# Patient Record
Sex: Female | Born: 1990 | Race: Black or African American | Hispanic: No | Smoking: Never smoker
Health system: Southern US, Community
[De-identification: ages and names within clinical notes are randomized; demographics above are authoritative.]

## PROBLEM LIST (undated history)

## (undated) ENCOUNTER — Inpatient Hospital Stay (HOSPITAL_COMMUNITY): Payer: Self-pay

## (undated) DIAGNOSIS — U071 COVID-19: Secondary | ICD-10-CM

## (undated) DIAGNOSIS — K5900 Constipation, unspecified: Secondary | ICD-10-CM

## (undated) DIAGNOSIS — D649 Anemia, unspecified: Secondary | ICD-10-CM

## (undated) DIAGNOSIS — R079 Chest pain, unspecified: Secondary | ICD-10-CM

## (undated) DIAGNOSIS — F32A Depression, unspecified: Secondary | ICD-10-CM

## (undated) DIAGNOSIS — F419 Anxiety disorder, unspecified: Secondary | ICD-10-CM

## (undated) DIAGNOSIS — R0602 Shortness of breath: Secondary | ICD-10-CM

## (undated) DIAGNOSIS — R7303 Prediabetes: Secondary | ICD-10-CM

## (undated) DIAGNOSIS — B379 Candidiasis, unspecified: Secondary | ICD-10-CM

## (undated) DIAGNOSIS — F3181 Bipolar II disorder: Secondary | ICD-10-CM

## (undated) DIAGNOSIS — D219 Benign neoplasm of connective and other soft tissue, unspecified: Secondary | ICD-10-CM

## (undated) DIAGNOSIS — F909 Attention-deficit hyperactivity disorder, unspecified type: Secondary | ICD-10-CM

## (undated) DIAGNOSIS — Z62810 Personal history of physical and sexual abuse in childhood: Secondary | ICD-10-CM

## (undated) DIAGNOSIS — F329 Major depressive disorder, single episode, unspecified: Secondary | ICD-10-CM

## (undated) DIAGNOSIS — J189 Pneumonia, unspecified organism: Secondary | ICD-10-CM

## (undated) DIAGNOSIS — F4310 Post-traumatic stress disorder, unspecified: Secondary | ICD-10-CM

## (undated) DIAGNOSIS — F603 Borderline personality disorder: Secondary | ICD-10-CM

## (undated) DIAGNOSIS — M7989 Other specified soft tissue disorders: Secondary | ICD-10-CM

## (undated) DIAGNOSIS — M549 Dorsalgia, unspecified: Secondary | ICD-10-CM

## (undated) DIAGNOSIS — F319 Bipolar disorder, unspecified: Secondary | ICD-10-CM

## (undated) DIAGNOSIS — M2550 Pain in unspecified joint: Secondary | ICD-10-CM

## (undated) DIAGNOSIS — G4730 Sleep apnea, unspecified: Secondary | ICD-10-CM

## (undated) HISTORY — DX: Other specified soft tissue disorders: M79.89

## (undated) HISTORY — DX: Anxiety disorder, unspecified: F41.9

## (undated) HISTORY — DX: Constipation, unspecified: K59.00

## (undated) HISTORY — DX: Pain in unspecified joint: M25.50

## (undated) HISTORY — DX: Post-traumatic stress disorder, unspecified: F43.10

## (undated) HISTORY — DX: Dorsalgia, unspecified: M54.9

## (undated) HISTORY — DX: Chest pain, unspecified: R07.9

## (undated) HISTORY — DX: Prediabetes: R73.03

## (undated) HISTORY — DX: Shortness of breath: R06.02

## (undated) HISTORY — DX: Sleep apnea, unspecified: G47.30

## (undated) HISTORY — DX: Attention-deficit hyperactivity disorder, unspecified type: F90.9

---

## 2011-02-11 ENCOUNTER — Emergency Department (HOSPITAL_COMMUNITY)
Admission: EM | Admit: 2011-02-11 | Discharge: 2011-02-12 | Disposition: A | Discharge status: Home / Self Care | Payer: 59 | Attending: Emergency Medicine | Admitting: Emergency Medicine

## 2011-02-11 ENCOUNTER — Encounter: Payer: Self-pay | Admitting: Emergency Medicine

## 2011-02-11 DIAGNOSIS — R109 Unspecified abdominal pain: Secondary | ICD-10-CM | POA: Insufficient documentation

## 2011-02-11 DIAGNOSIS — R509 Fever, unspecified: Secondary | ICD-10-CM | POA: Insufficient documentation

## 2011-02-11 DIAGNOSIS — R112 Nausea with vomiting, unspecified: Secondary | ICD-10-CM | POA: Insufficient documentation

## 2011-02-11 DIAGNOSIS — R197 Diarrhea, unspecified: Secondary | ICD-10-CM

## 2011-02-11 MED ORDER — ONDANSETRON HCL 4 MG/2ML IJ SOLN
4.0000 mg | Freq: Once | INTRAMUSCULAR | Status: DC
  Filled 2011-02-11: qty 2

## 2011-02-11 MED ORDER — ACETAMINOPHEN 325 MG PO TABS
650.0000 mg | ORAL_TABLET | Freq: Once | ORAL | Status: AC
  Administered 2011-02-12: 650 mg via ORAL
  Filled 2011-02-11: qty 2

## 2011-02-11 NOTE — ED Notes (Signed)
Generalized abdominal pain with nausea and vomiting onset 4 a.m. today

## 2011-02-11 NOTE — ED Notes (Signed)
WUJ:WJ19<JY> Expected date:02/11/11<BR> Expected time:10:18 PM<BR> Means of arrival:Ambulance<BR> Comments:<BR> M251. 21 yo f. abd pain since 2000. Stable, ambulatory. TRIAGE.  10 MIN ETA

## 2011-02-12 LAB — URINALYSIS, ROUTINE W REFLEX MICROSCOPIC
Bilirubin Urine: NEGATIVE
Hgb urine dipstick: NEGATIVE
Ketones, ur: NEGATIVE mg/dL
Specific Gravity, Urine: 1.023 (ref 1.005–1.030)
Urobilinogen, UA: 0.2 mg/dL (ref 0.0–1.0)

## 2011-02-12 LAB — DIFFERENTIAL
Eosinophils Absolute: 0 10*3/uL (ref 0.0–0.7)
Eosinophils Relative: 0 % (ref 0–5)
Lymphs Abs: 0.5 10*3/uL — ABNORMAL LOW (ref 0.7–4.0)
Monocytes Relative: 3 % (ref 3–12)

## 2011-02-12 LAB — LIPASE, BLOOD: Lipase: 16 U/L (ref 11–59)

## 2011-02-12 LAB — POCT I-STAT, CHEM 8
Calcium, Ion: 1.13 mmol/L (ref 1.12–1.32)
Glucose, Bld: 111 mg/dL — ABNORMAL HIGH (ref 70–99)
HCT: 37 % (ref 36.0–46.0)
Hemoglobin: 12.6 g/dL (ref 12.0–15.0)
TCO2: 23 mmol/L (ref 0–100)

## 2011-02-12 LAB — CBC
MCH: 25.8 pg — ABNORMAL LOW (ref 26.0–34.0)
MCHC: 32.8 g/dL (ref 30.0–36.0)
MCV: 78.9 fL (ref 78.0–100.0)
Platelets: 253 10*3/uL (ref 150–400)
RBC: 4.45 MIL/uL (ref 3.87–5.11)

## 2011-02-12 LAB — HEPATIC FUNCTION PANEL
Alkaline Phosphatase: 73 U/L (ref 39–117)
Total Bilirubin: 0.5 mg/dL (ref 0.3–1.2)

## 2011-02-12 MED ORDER — KETOROLAC TROMETHAMINE 30 MG/ML IJ SOLN
30.0000 mg | Freq: Once | INTRAMUSCULAR | Status: AC
  Administered 2011-02-12: 30 mg via INTRAVENOUS
  Filled 2011-02-12: qty 1

## 2011-02-12 MED ORDER — OXYCODONE-ACETAMINOPHEN 5-325 MG PO TABS
1.0000 | ORAL_TABLET | Freq: Once | ORAL | Status: AC
  Administered 2011-02-12: 1 via ORAL
  Filled 2011-02-12: qty 1

## 2011-02-12 MED ORDER — ONDANSETRON HCL 4 MG PO TABS: 4.0000 mg | ORAL_TABLET | Freq: Four times a day (QID) | ORAL | Status: AC

## 2011-02-12 NOTE — ED Notes (Signed)
Oral temp 99.2--Reports feeling much improved---Has drank some Ginger-ale and tolerated well w/o nausea or vomiting

## 2011-02-12 NOTE — ED Provider Notes (Signed)
History     CSN: [id number]  Arrival date & time 02/11/11  2232   First MD Initiated Contact with Patient 02/11/11 2334      Chief Complaint  Patient presents with  . Abdominal Pain    (Consider location/radiation/quality/duration/timing/severity/associated sxs/prior treatment) Patient is a 21 y.o. female presenting with abdominal pain. The history is provided by the patient. No language interpreter was used.  Abdominal Pain The primary symptoms of the illness include abdominal pain, fever, nausea, vomiting and diarrhea. The primary symptoms of the illness do not include dysuria or vaginal discharge. Primary symptoms comment: urinary frequency The current episode started 13 to 24 hours ago. The onset of the illness was gradual. The problem has not changed since onset. The illness is associated with awakening from sleep. Symptoms associated with the illness do not include chills, anorexia, diaphoresis, heartburn, constipation, urgency, hematuria, frequency or back pain. Significant associated medical issues do not include inflammatory bowel disease or diabetes.    No past medical history on file.  No past surgical history on file.  No family history on file.  History  Substance Use Topics  . Smoking status: Not on file  . Smokeless tobacco: Not on file  . Alcohol Use: Not on file    OB History    Grav Para Term Preterm Abortions TAB SAB Ect Mult Living                  Review of Systems  Constitutional: Positive for fever. Negative for chills and diaphoresis.  HENT: Negative for neck pain and neck stiffness.   Eyes: Negative.   Respiratory: Negative for cough.   Cardiovascular: Negative for chest pain.  Gastrointestinal: Positive for nausea, vomiting, abdominal pain and diarrhea. Negative for heartburn, constipation, abdominal distention and anorexia.  Genitourinary: Negative for dysuria, urgency, frequency, hematuria and vaginal discharge.  Musculoskeletal: Negative for  back pain.  Skin: Negative.   Neurological: Negative.   Hematological: Negative.   Psychiatric/Behavioral: Negative.     Allergies  Augmentin  Home Medications  No current outpatient prescriptions on file.  BP 131/50  Pulse 104  Temp(Src) 100.9 F (38.3 C) (Oral)  Resp 18  SpO2 99%  Physical Exam  Vitals reviewed. Constitutional: She is oriented to person, place, and time. She appears well-developed and well-nourished. No distress.  HENT:  Head: Normocephalic and atraumatic.  Mouth/Throat: Oropharynx is clear and moist. No oropharyngeal exudate.  Eyes: Conjunctivae and EOM are normal. Pupils are equal, round, and reactive to light.  Neck: Normal range of motion. Neck supple.  Cardiovascular: Normal rate and regular rhythm.   Pulmonary/Chest: Effort normal and breath sounds normal.  Abdominal: Soft. Bowel sounds are normal. There is no tenderness. There is no rebound and no guarding.  Genitourinary: Cervix exhibits no motion tenderness. Right adnexum displays no tenderness. Left adnexum displays no tenderness.       Chaperone present  Musculoskeletal: Normal range of motion. She exhibits no edema.  Lymphadenopathy:    She has no cervical adenopathy.  Neurological: She is alert and oriented to person, place, and time.  Skin: Skin is warm and dry.  Psychiatric: Thought content normal.    ED Course  Procedures (including critical care time)  Labs Reviewed  CBC - Abnormal; Notable for the following:    Hemoglobin 11.5 (*)    HCT 35.1 (*)    MCH 25.8 (*)    All other components within normal limits  DIFFERENTIAL - Abnormal; Notable for the following:  Neutrophils Relative 91 (*)    Neutro Abs 8.8 (*)    Lymphocytes Relative 5 (*)    Lymphs Abs 0.5 (*)    All other components within normal limits  POCT PREGNANCY, URINE  URINALYSIS, ROUTINE W REFLEX MICROSCOPIC  POCT PREGNANCY, URINE  I-STAT, CHEM 8  HEPATIC FUNCTION PANEL  LIPASE, BLOOD   No results  found.   No diagnosis found.    MDM  Po challenged with no further nausea lower abdominal cramping resolved.  No RLQ pain, abdomen is soft and non tender and benign.  No indication for imaging as exam and labs are reasurring.  Patient told to follow up with doctor for pap smear and to return for persistent vomiting diarrhea, fevers chills worsening pain especially that localizes to the right lower quadrant or any concerns.  Patient verbalizes understanding and agrees to follow up        Darin Redmann Smitty Cords, MD 02/12/11 1610

## 2011-03-28 ENCOUNTER — Encounter (HOSPITAL_COMMUNITY): Payer: Self-pay | Admitting: Emergency Medicine

## 2011-03-28 ENCOUNTER — Emergency Department (HOSPITAL_COMMUNITY)
Admission: EM | Admit: 2011-03-28 | Discharge: 2011-03-28 | Disposition: A | Discharge status: Home / Self Care | Payer: 59 | Attending: Emergency Medicine | Admitting: Emergency Medicine

## 2011-03-28 DIAGNOSIS — Z008 Encounter for other general examination: Secondary | ICD-10-CM | POA: Insufficient documentation

## 2011-03-28 DIAGNOSIS — F329 Major depressive disorder, single episode, unspecified: Secondary | ICD-10-CM

## 2011-03-28 DIAGNOSIS — G4700 Insomnia, unspecified: Secondary | ICD-10-CM | POA: Insufficient documentation

## 2011-03-28 DIAGNOSIS — F3189 Other bipolar disorder: Secondary | ICD-10-CM | POA: Insufficient documentation

## 2011-03-28 HISTORY — DX: Bipolar II disorder: F31.81

## 2011-03-28 LAB — RAPID URINE DRUG SCREEN, HOSP PERFORMED
Opiates: NOT DETECTED
Tetrahydrocannabinol: NOT DETECTED

## 2011-03-28 LAB — URINALYSIS, ROUTINE W REFLEX MICROSCOPIC
Glucose, UA: NEGATIVE mg/dL
Leukocytes, UA: NEGATIVE
Nitrite: NEGATIVE
Protein, ur: NEGATIVE mg/dL
Urobilinogen, UA: 0.2 mg/dL (ref 0.0–1.0)

## 2011-03-28 LAB — CBC
HCT: 35 % — ABNORMAL LOW (ref 36.0–46.0)
Hemoglobin: 11.2 g/dL — ABNORMAL LOW (ref 12.0–15.0)
MCV: 80.8 fL (ref 78.0–100.0)
Platelets: 293 10*3/uL (ref 150–400)
RBC: 4.33 MIL/uL (ref 3.87–5.11)
WBC: 7.5 10*3/uL (ref 4.0–10.5)

## 2011-03-28 LAB — COMPREHENSIVE METABOLIC PANEL
AST: 16 U/L (ref 0–37)
BUN: 13 mg/dL (ref 6–23)
CO2: 24 mEq/L (ref 19–32)
Chloride: 104 mEq/L (ref 96–112)
Creatinine, Ser: 0.84 mg/dL (ref 0.50–1.10)
GFR calc non Af Amer: >90 mL/min (ref >90)
Total Bilirubin: 0.1 mg/dL — ABNORMAL LOW (ref 0.3–1.2)

## 2011-03-28 MED ORDER — IBUPROFEN 600 MG PO TABS: 600.0000 mg | ORAL_TABLET | Freq: Three times a day (TID) | ORAL | Status: DC | PRN

## 2011-03-28 MED ORDER — ALUM & MAG HYDROXIDE-SIMETH 200-200-20 MG/5ML PO SUSP: 30.0000 mL | ORAL | Status: DC | PRN

## 2011-03-28 MED ORDER — ACETAMINOPHEN 325 MG PO TABS: 650.0000 mg | ORAL_TABLET | ORAL | Status: DC | PRN

## 2011-03-28 MED ORDER — ONDANSETRON HCL 4 MG PO TABS: 4.0000 mg | ORAL_TABLET | Freq: Three times a day (TID) | ORAL | Status: DC | PRN

## 2011-03-28 NOTE — ED Notes (Signed)
Crisis Counselor leaves, pt remains in triage room, denies SI, cont to monitor

## 2011-03-28 NOTE — ED Notes (Signed)
Pt with hx of Bipolar D/O/Depression, off medications x 2 yrs, sees therapist on regular basis, reports she is having some difficulty coping with her best friend/roommate telling her after they had multiple sexual encounters that he no longer wanted to be her friend. Pt denies SI/HI/AH/VH, states what roommate told her was abrupt and she needs some time to get away and figure out how she is going to cope with having to see him everyday and not hand out with him like she used to.

## 2011-03-28 NOTE — ED Notes (Signed)
Pt ambulates to restroom to provide urine sample.

## 2011-03-28 NOTE — ED Notes (Signed)
Pt alert, c/o SI, ? Attempt, took advil, nyquil, states wants to sleep, wrote suicide notes last week, resp even unlabored, skin pwd, pt presents with counselor from [facility]

## 2011-03-28 NOTE — ED Provider Notes (Signed)
20yo F, c/o feeling "depressed."  No SI.  Hx of same, not taking her meds, has an outpt mental health provider.  Seen by [facility].  Pt continues to deny SI.  They have spoken with pt's outpt MH provider who will f/u pt.  Will d/c.   Laray Anger, DO 03/28/11 1214

## 2011-03-28 NOTE — ED Notes (Signed)
Report to Natasha RN

## 2011-03-28 NOTE — ED Notes (Signed)
Counsellor interviewing patient

## 2011-03-28 NOTE — ED Provider Notes (Signed)
History     CSN: [id number]  Arrival date & time 03/28/11  7829   First MD Initiated Contact with Patient 03/28/11 [phone]      Chief Complaint  Patient presents with  . Medical Clearance  . Depression    Patient is a 21 y.o. female presenting with mental health disorder. The history is provided by the patient. The history is limited by a language barrier.  Mental Health Problem The primary symptoms do not include dysphoric mood, delusions, hallucinations, bizarre behavior, disorganized speech, negative symptoms or somatic symptoms. The current episode started more than 2 weeks ago. This is a recurrent problem.  The onset of the illness is precipitated by a stressful event and emotional stress. The degree of incapacity that she is experiencing as a consequence of her illness is moderate. Sequelae of the illness include an inability to work and harmed interpersonal relations. Additional symptoms of the illness include insomnia, appetite change and fatigue. Additional symptoms of the illness do not include no increased goal-directed activity, no flight of ideas, no decreased need for sleep, no headaches, no abdominal pain or no seizures. She does not admit to suicidal ideas. She does not have a plan to commit suicide. She does not contemplate harming herself. She has not already injured self. She does not contemplate injuring another person. She has not already  injured another person. Risk factors that are present for mental illness include a history of mental illness and a family history of mental illness.   patient reports increasing difficulties with depression since approximately August 2012. States symptoms were precipitated by the breakup of her relationship.patient admits that she has been diagnosed with bipolar type II disorder as a young child, but the last 2 years has been able to manage her symptoms without medications. 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 States symptoms include inability to get a class relief her apartment, stays in the bed all the time and has very little appetite. She has been staying with a roommate who told her this morning that he was tired of dealing with her issues and essentially ask her to find somewhere else to stay. Patient states she feels herself getting out of control of her symptoms and feels that she needs more intense therapy than her outpatient visits with her therapist. Patient denies SI/HI. Denies any physical complaints or recent illnesses.    Past Medical History  Diagnosis Date  . Bipolar 2 disorder     History reviewed. No pertinent past surgical history.  No family history on file.  History  Substance Use Topics  . Smoking status: Never Smoker   . Smokeless tobacco: Not on file  . Alcohol Use: No    OB History    Grav Para Term Preterm Abortions TAB SAB Ect Mult Living                  Review of Systems  Constitutional: Positive for appetite change and fatigue.  HENT: Negative.   Eyes: Negative.   Respiratory: Negative.   Cardiovascular: Negative.   Gastrointestinal: Negative.  Negative for abdominal pain.  Genitourinary: Negative.   Musculoskeletal: Negative.   Skin: Negative.   Neurological: Negative.  Negative for seizures and headaches.  Hematological: Negative.   Psychiatric/Behavioral: Negative for hallucinations  and dysphoric mood. The patient has insomnia.     Allergies  Augmentin  Home Medications  No current outpatient prescriptions on file.  BP 130/67  Pulse 70  Temp(Src) 98.5 F (36.9 C) (Oral)  Resp 18  Wt 199 lb 12.8 oz (90.629 kg)  SpO2 100%  LMP 03/18/2011  Physical Exam  Constitutional: She is oriented to person, place, and time. She appears well-developed and  well-nourished.  HENT:  Head: Normocephalic and atraumatic.  Eyes: Conjunctivae are normal.  Neck: Neck supple.  Cardiovascular: Normal rate and regular rhythm.   Pulmonary/Chest: Effort normal and breath sounds normal.  Abdominal: Soft. Bowel sounds are normal.  Musculoskeletal: Normal range of motion.  Neurological: She is alert and oriented to person, place, and time.  Skin: Skin is warm and dry. No erythema.  Psychiatric: She has a normal mood and affect.    ED Course  Procedures  [phone]:  Will move patient to psych ED holding, for medical clearance and place request for Telepsych consult. I have discussed patient with Dr. Hyacinth Meeker who is aware of plan and is agreeable.   Labs Reviewed  CBC - Abnormal; Notable for the following:    Hemoglobin 11.2 (*)    HCT 35.0 (*)    MCH 25.9 (*)    All other components within normal limits  COMPREHENSIVE METABOLIC PANEL - Abnormal; Notable for the following:    Glucose, Bld 108 (*)    Total Bilirubin 0.1 (*)    All other components within normal limits  ETHANOL  PREGNANCY, URINE  URINALYSIS, ROUTINE W REFLEX MICROSCOPIC  URINE RAPID DRUG SCREEN (HOSP PERFORMED)   No results found.   No diagnosis found.    MDM  HPI/PE c/w 1. Normal exam 2. Medical clearance and Telepysch eval pending        Leanne Chang, NP 03/28/11 [phone]

## 2011-03-28 NOTE — ED Notes (Signed)
MD at bedside. 

## 2011-03-28 NOTE — ED Notes (Signed)
Warmed breakfast up, given cereal

## 2011-03-28 NOTE — ED Notes (Signed)
Pt states not Suicidal @ this time, dealing with a lot of stress, wants to come here voluntarily

## 2011-03-30 NOTE — ED Provider Notes (Signed)
Medical screening examination/treatment/procedure(s) were performed by non-physician practitioner and as supervising physician I was immediately available for consultation/collaboration.   Vida Roller, MD 03/30/11 [phone]

## 2011-06-16 ENCOUNTER — Emergency Department (HOSPITAL_COMMUNITY)
Admission: EM | Admit: 2011-06-16 | Discharge: 2011-06-16 | Discharge status: Against Medical Advice | Payer: 59 | Attending: Emergency Medicine | Admitting: Emergency Medicine

## 2011-06-16 ENCOUNTER — Inpatient Hospital Stay (HOSPITAL_COMMUNITY)
Admission: AD | Admit: 2011-06-16 | Discharge: 2011-06-17 | Disposition: A | Discharge status: Home / Self Care | Payer: 59 | Source: Ambulatory Visit | Attending: Obstetrics and Gynecology | Admitting: Obstetrics and Gynecology

## 2011-06-16 ENCOUNTER — Encounter (HOSPITAL_COMMUNITY): Payer: Self-pay | Admitting: Emergency Medicine

## 2011-06-16 ENCOUNTER — Encounter (HOSPITAL_COMMUNITY): Payer: Self-pay | Admitting: *Deleted

## 2011-06-16 DIAGNOSIS — N949 Unspecified condition associated with female genital organs and menstrual cycle: Secondary | ICD-10-CM | POA: Insufficient documentation

## 2011-06-16 DIAGNOSIS — B3731 Acute candidiasis of vulva and vagina: Secondary | ICD-10-CM | POA: Insufficient documentation

## 2011-06-16 DIAGNOSIS — N390 Urinary tract infection, site not specified: Secondary | ICD-10-CM | POA: Insufficient documentation

## 2011-06-16 DIAGNOSIS — N898 Other specified noninflammatory disorders of vagina: Secondary | ICD-10-CM | POA: Insufficient documentation

## 2011-06-16 DIAGNOSIS — B373 Candidiasis of vulva and vagina: Secondary | ICD-10-CM | POA: Insufficient documentation

## 2011-06-16 DIAGNOSIS — B379 Candidiasis, unspecified: Secondary | ICD-10-CM

## 2011-06-16 DIAGNOSIS — B49 Unspecified mycosis: Secondary | ICD-10-CM

## 2011-06-16 HISTORY — DX: Bipolar disorder, unspecified: F31.9

## 2011-06-16 HISTORY — DX: Pneumonia, unspecified organism: J18.9

## 2011-06-16 LAB — POCT PREGNANCY, URINE: Preg Test, Ur: NEGATIVE

## 2011-06-16 NOTE — ED Notes (Signed)
Pt c/o vaginal itching, white discharge, denies odor. Denies dysuria.

## 2011-06-16 NOTE — MAU Note (Signed)
Pt states, " I have missed my period as I'm on a 28 day cycle and I'm never late. I started having vaginal itching and burning two- three days ago and now I can barely walk."

## 2011-06-16 NOTE — MAU Provider Note (Signed)
History     CSN: [id number]  Arrival date and time: 06/16/11 2225   First Provider Initiated Contact with Patient 06/16/11 2353      Chief Complaint  Patient presents with  . Vaginal Pain   HPI  Pt is here with report of vaginal itching and irritation x 2 days.  +white thick vaginal discharge, no vaginal odor.   +vaginal lesions x 2 days.  Denies recently shaving vagina.  Started after intercourse when not well lubricated.  Patient's last menstrual period was 05/15/2011.  +condoms intermittently.      Past Medical History  Diagnosis Date  . Bipolar 2 disorder   . Pneumonia     age 5   . Bipolar disorder     History reviewed. No pertinent past surgical history.  Family History  Problem Relation Age of Onset  . Anesthesia problems Neg Hx   . Hypotension Neg Hx   . Malignant hyperthermia Neg Hx   . Pseudochol deficiency Neg Hx     History  Substance Use Topics  . Smoking status: Never Smoker   . Smokeless tobacco: Not on file  . Alcohol Use: 0.6 oz/week    1 Glasses of wine per week     occasional    Allergies:  Allergies  Allergen Reactions  . Amoxicillin-Pot Clavulanate     No prescriptions prior to admission    Review of Systems  Genitourinary:       Vaginal lesions and discharge  All other systems reviewed and are negative.   Physical Exam   Blood pressure 121/55, pulse 77, temperature 99.5 F (37.5 C), temperature source Oral, resp. rate 20, height 5' 2.5" (1.588 m), weight 84.936 kg (187 lb 4 oz), last menstrual period 05/15/2011.  Physical Exam  Constitutional: She is oriented to person, place, and time. She appears well-developed and well-nourished. No distress.  HENT:  Head: Normocephalic.  Neck: Normal range of motion. Neck supple.  Cardiovascular: Normal rate, regular rhythm and normal heart sounds.  Exam reveals no gallop and no friction rub.   No murmur heard. Respiratory: Effort normal and breath sounds normal. No respiratory  distress.  GI: Soft. There is no tenderness. There is no CVA tenderness.  Genitourinary: Cervix exhibits no motion tenderness and no discharge. No bleeding around the vagina. Vaginal discharge (white, thick, curd-like) found.  Musculoskeletal: Normal range of motion.  Neurological: She is alert and oriented to person, place, and time.  Skin: Skin is warm and dry.  Psychiatric: She has a normal mood and affect.    MAU Course  Procedures  Results for orders placed during the hospital encounter of 06/16/11 (from the past 24 hour(s))  URINALYSIS, ROUTINE W REFLEX MICROSCOPIC     Status: Abnormal   Collection Time   06/16/11 10:40 PM      Component Value Range   Color, Urine YELLOW  YELLOW    APPearance CLEAR  CLEAR    Specific Gravity, Urine 1.025  1.005 - 1.030    pH 6.0  5.0 - 8.0    Glucose, UA NEGATIVE  NEGATIVE (mg/dL)   Hgb urine dipstick NEGATIVE  NEGATIVE    Bilirubin Urine NEGATIVE  NEGATIVE    Ketones, ur NEGATIVE  NEGATIVE (mg/dL)   Protein, ur NEGATIVE  NEGATIVE (mg/dL)   Urobilinogen, UA 0.2  0.0 - 1.0 (mg/dL)   Nitrite NEGATIVE  NEGATIVE    Leukocytes, UA MODERATE (*) NEGATIVE   URINE MICROSCOPIC-ADD ON     Status: Abnormal  Collection Time   06/16/11 10:40 PM      Component Value Range   Squamous Epithelial / LPF RARE  RARE    WBC, UA 3-6  <3 (WBC/hpf)   RBC / HPF 3-6  <3 (RBC/hpf)   Bacteria, UA FEW (*) RARE    Urine-Other FEW YEAST    POCT PREGNANCY, URINE     Status: Normal   Collection Time   06/16/11 11:24 PM      Component Value Range   Preg Test, Ur NEGATIVE  NEGATIVE   WET PREP, GENITAL     Status: Abnormal   Collection Time   06/17/11 12:05 AM      Component Value Range   Yeast Wet Prep HPF POC FEW (*) NONE SEEN    Trich, Wet Prep NONE SEEN  NONE SEEN    Clue Cells Wet Prep HPF POC NONE SEEN  NONE SEEN    WBC, Wet Prep HPF POC MODERATE (*) NONE SEEN    Diflucan 150 mg PO  Assessment and Plan  UTI Yeast Infection  Plan: DC to home RX  Bactrim Use OTC Monistat Urine culture GC/CT pending  [facility] 06/16/2011, 11:54 PM

## 2011-06-17 LAB — WET PREP, GENITAL: Trich, Wet Prep: NONE SEEN

## 2011-06-17 LAB — URINALYSIS, ROUTINE W REFLEX MICROSCOPIC
Glucose, UA: NEGATIVE mg/dL
Nitrite: NEGATIVE
Protein, ur: NEGATIVE mg/dL
pH: 6 (ref 5.0–8.0)

## 2011-06-17 LAB — URINE MICROSCOPIC-ADD ON

## 2011-06-17 MED ORDER — FLUCONAZOLE 150 MG PO TABS
150.0000 mg | ORAL_TABLET | Freq: Once | ORAL | Status: AC
  Administered 2011-06-17: 150 mg via ORAL
  Filled 2011-06-17: qty 1

## 2011-06-17 MED ORDER — SULFAMETHOXAZOLE-TRIMETHOPRIM 800-160 MG PO TABS: 1.0000 | ORAL_TABLET | Freq: Two times a day (BID) | ORAL | Status: AC

## 2011-06-17 MED ORDER — LIDOCAINE HCL 2 % EX GEL
Freq: Once | CUTANEOUS | Status: AC
  Administered 2011-06-17: 10 via TOPICAL
  Filled 2011-06-17: qty 20

## 2011-06-17 NOTE — Progress Notes (Signed)
Threasa Heads CNM notified of pt's wet prep results. Will d/chome

## 2011-06-17 NOTE — Progress Notes (Signed)
Written and verbal d/c instructions given and understanding voiced. 

## 2011-06-17 NOTE — Discharge Instructions (Signed)
Urinary Tract Infection Infections of the urinary tract can start in several places. A bladder infection (cystitis), a kidney infection (pyelonephritis), and a prostate infection (prostatitis) are different types of urinary tract infections (UTIs). They usually get better if treated with medicines (antibiotics) that kill germs. Take all the medicine until it is gone. You or your child may feel better in a few days, but TAKE ALL MEDICINE or the infection may not respond and may become more difficult to treat. HOME CARE INSTRUCTIONS   Drink enough water and fluids to keep the urine clear or pale yellow. Cranberry juice is especially recommended, in addition to large amounts of water.   Avoid caffeine, tea, and carbonated beverages. They tend to irritate the bladder.   Alcohol may irritate the prostate.   Only take over-the-counter or prescription medicines for pain, discomfort, or fever as directed by your caregiver.  To prevent further infections:  Empty the bladder often. Avoid holding urine for long periods of time.   After a bowel movement, women should cleanse from front to back. Use each tissue only once.   Empty the bladder before and after sexual intercourse.  FINDING OUT THE RESULTS OF YOUR TEST Not all test results are available during your visit. If your or your child's test results are not back during the visit, make an appointment with your caregiver to find out the results. Do not assume everything is normal if you have not heard from your caregiver or the medical facility. It is important for you to follow up on all test results. SEEK MEDICAL CARE IF:   There is back pain.   Your baby is older than 3 months with a rectal temperature of 100.5 F (38.1 C) or higher for more than 1 day.   Your or your child's problems (symptoms) are no better in 3 days. Return sooner if you or your child is getting worse.  SEEK IMMEDIATE MEDICAL CARE IF:   There is severe back pain or lower  abdominal pain.   You or your child develops chills.   You have a fever.   Your baby is older than 3 months with a rectal temperature of 102 F (38.9 C) or higher.   Your baby is 3 months old or younger with a rectal temperature of 100.4 F (38 C) or higher.   There is nausea or vomiting.   There is continued burning or discomfort with urination.  MAKE SURE YOU:   Understand these instructions.   Will watch your condition.   Will get help right away if you are not doing well or get worse.  Document Released: 11/01/2004 Document Revised: 01/11/2011 Document Reviewed: 06/06/2006 [employer] Patient Information 2012 [employer].Urinary Tract Infection Infections of the urinary tract can start in several places. A bladder infection (cystitis), a kidney infection (pyelonephritis), and a prostate infection (prostatitis) are different types of urinary tract infections (UTIs). They usually get better if treated with medicines (antibiotics) that kill germs. Take all the medicine until it is gone. You or your child may feel better in a few days, but TAKE ALL MEDICINE or the infection may not respond and may become more difficult to treat. HOME CARE INSTRUCTIONS   Drink enough water and fluids to keep the urine clear or pale yellow. Cranberry juice is especially recommended, in addition to large amounts of water.   Avoid caffeine, tea, and carbonated beverages. They tend to irritate the bladder.   Alcohol may irritate the prostate.   Only take   over-the-counter or prescription medicines for pain, discomfort, or fever as directed by your caregiver.  To prevent further infections:  Empty the bladder often. Avoid holding urine for long periods of time.   After a bowel movement, women should cleanse from front to back. Use each tissue only once.   Empty the bladder before and after sexual intercourse.  FINDING OUT THE RESULTS OF YOUR TEST Not all test results are available during your  visit. If your or your child's test results are not back during the visit, make an appointment with your caregiver to find out the results. Do not assume everything is normal if you have not heard from your caregiver or the medical facility. It is important for you to follow up on all test results. SEEK MEDICAL CARE IF:   There is back pain.   Your baby is older than 3 months with a rectal temperature of 100.5 F (38.1 C) or higher for more than 1 day.   Your or your child's problems (symptoms) are no better in 3 days. Return sooner if you or your child is getting worse.  SEEK IMMEDIATE MEDICAL CARE IF:   There is severe back pain or lower abdominal pain.   You or your child develops chills.   You have a fever.   Your baby is older than 3 months with a rectal temperature of 102 F (38.9 C) or higher.   Your baby is 3 months old or younger with a rectal temperature of 100.4 F (38 C) or higher.   There is nausea or vomiting.   There is continued burning or discomfort with urination.  MAKE SURE YOU:   Understand these instructions.   Will watch your condition.   Will get help right away if you are not doing well or get worse.  Document Released: 11/01/2004 Document Revised: 01/11/2011 Document Reviewed: 06/06/2006 [employer] Patient Information 2012 [employer]. 

## 2011-06-18 LAB — GC/CHLAMYDIA PROBE AMP, GENITAL
Chlamydia, DNA Probe: NEGATIVE
GC Probe Amp, Genital: NEGATIVE

## 2011-06-18 NOTE — MAU Provider Note (Signed)
Agree with above note.  Tasha Barrett 06/18/2011 12:18 PM

## 2011-07-07 ENCOUNTER — Encounter (HOSPITAL_COMMUNITY): Payer: Self-pay

## 2011-07-07 ENCOUNTER — Emergency Department (INDEPENDENT_AMBULATORY_CARE_PROVIDER_SITE_OTHER)
Admission: EM | Admit: 2011-07-07 | Discharge: 2011-07-07 | Disposition: A | Discharge status: Home / Self Care | Payer: 59 | Source: Home / Self Care | Attending: Emergency Medicine | Admitting: Emergency Medicine

## 2011-07-07 DIAGNOSIS — B3731 Acute candidiasis of vulva and vagina: Secondary | ICD-10-CM

## 2011-07-07 DIAGNOSIS — B373 Candidiasis of vulva and vagina: Secondary | ICD-10-CM

## 2011-07-07 DIAGNOSIS — N760 Acute vaginitis: Secondary | ICD-10-CM

## 2011-07-07 LAB — WET PREP, GENITAL: Clue Cells Wet Prep HPF POC: NONE SEEN

## 2011-07-07 MED ORDER — HYDROCORTISONE 1 % EX CREA: TOPICAL_CREAM | CUTANEOUS | Status: DC

## 2011-07-07 MED ORDER — FLUCONAZOLE 150 MG PO TABS: 150.0000 mg | ORAL_TABLET | Freq: Once | ORAL | Status: AC

## 2011-07-07 NOTE — ED Notes (Signed)
Pt has vaginal swelling and itching since yesterday, she states she had similar symptoms one month ago and was txed for yeast and uti.  She used latex condoms yesterday and had used one with the last episode.  She is unable to provide a urine sample today.
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
## 2011-07-07 NOTE — Discharge Instructions (Signed)
Monilial Vaginitis Vaginitis in a soreness, swelling and redness (inflammation) of the vagina and vulva. Monilial vaginitis is not a sexually transmitted infection. CAUSES  Yeast vaginitis is caused by yeast (candida) that is normally found in your vagina. With a yeast infection, the candida has overgrown in number to a point that upsets the chemical balance. SYMPTOMS   White, thick vaginal discharge.   Swelling, itching, redness and irritation of the vagina and possibly the lips of the vagina (vulva).   Burning or painful urination.   Painful intercourse.  DIAGNOSIS  Things that may contribute to monilial vaginitis are:  Postmenopausal and virginal states.   Pregnancy.   Infections.   Being tired, sick or stressed, especially if you had monilial vaginitis in the past.   Diabetes. Good control will help lower the chance.   Birth control pills.   Tight fitting garments.   Using bubble bath, feminine sprays, douches or deodorant tampons.   Taking certain medications that kill germs (antibiotics).   Sporadic recurrence can occur if you become ill.  TREATMENT  Your caregiver will give you medication.  There are several kinds of anti monilial vaginal creams and suppositories specific for monilial vaginitis. For recurrent yeast infections, use a suppository or cream in the vagina 2 times a week, or as directed.   Anti-monilial or steroid cream for the itching or irritation of the vulva may also be used. Get your caregiver's permission.   Painting the vagina with methylene blue solution may help if the monilial cream does not work.   Eating yogurt may help prevent monilial vaginitis.  HOME CARE INSTRUCTIONS   Finish all medication as prescribed.   Do not have sex until treatment is completed or after your caregiver tells you it is okay.   Take warm sitz baths.   Do not douche.   Do not use tampons, especially scented ones.   Wear cotton underwear.   Avoid tight  pants and panty hose.   Tell your sexual partner that you have a yeast infection. They should go to their caregiver if they have symptoms such as mild rash or itching.   Your sexual partner should be treated as well if your infection is difficult to eliminate.   Practice safer sex. Use condoms.   Some vaginal medications cause latex condoms to fail. Vaginal medications that harm condoms are:   Cleocin cream.   Butoconazole (Femstat).   Terconazole (Terazol) vaginal suppository.   Miconazole (Monistat) (may be purchased over the counter).  SEEK MEDICAL CARE IF:   You have a temperature by mouth above 102 F (38.9 C).   The infection is getting worse after 2 days of treatment.   The infection is not getting better after 3 days of treatment.   You develop blisters in or around your vagina.   You develop vaginal bleeding, and it is not your menstrual period.   You have pain when you urinate.   You develop intestinal problems.   You have pain with sexual intercourse.  Document Released: 11/01/2004 Document Revised: 01/11/2011 Document Reviewed: 07/16/2008 [facility] Patient Information 2012 [place], [place].Vaginitis Vaginitis is when the vagina is sore, puffy (swollen), and red (inflamed). HOME CARE  Take your medicine as told. Finish them even if you start to feel better.   Do not have sex (intercourse) until treatment is done or as told by your doctor.   Take warm baths or sit in warm water (sitz bath).   Do not douche.   Do not  use tampons, especially scented ones.   Wear cotton underwear.   Do not wear tight pants or pantyhose.   Tell your sex partner that you have a yeast infection. Your partner should see a doctor if symptoms appear, such as a mild rash or itching.   Your sex partner should be treated if your infection is hard to get rid of.   Use a cream to stop the itching. Make sure it is approved by your doctor.  GET HELP RIGHT AWAY IF:   The  infection does not go away with treatment.   You have a temperature by mouth above 102 F (38.9 C).   You have belly (abdominal) pain.   You have bleeding from the vagina.  MAKE SURE YOU:   Understand these instructions.   Will watch this condition.   Will get help right away if you are not doing well or get worse.  Document Released: 04/20/2008 Document Revised: 01/11/2011 Document Reviewed: 04/20/2008 [facility] Patient Information 2012 [place], [place].

## 2011-07-08 ENCOUNTER — Telehealth (HOSPITAL_COMMUNITY): Payer: Self-pay | Admitting: Emergency Medicine

## 2011-07-08 NOTE — ED Notes (Signed)
Patient called stating [facility] did not have Diflucan and could not get it until Tuesday.  RX called in by Delaney Meigs RN to [facility] per patient request.

## 2011-07-09 LAB — GC/CHLAMYDIA PROBE AMP, GENITAL: Chlamydia, DNA Probe: NEGATIVE

## 2011-10-16 ENCOUNTER — Other Ambulatory Visit (HOSPITAL_COMMUNITY)
Admission: RE | Admit: 2011-10-16 | Discharge: 2011-10-16 | Disposition: A | Discharge status: Home / Self Care | Payer: 59 | Source: Ambulatory Visit | Attending: Obstetrics and Gynecology | Admitting: Obstetrics and Gynecology

## 2011-10-16 DIAGNOSIS — Z01419 Encounter for gynecological examination (general) (routine) without abnormal findings: Secondary | ICD-10-CM | POA: Insufficient documentation

## 2011-10-16 DIAGNOSIS — N760 Acute vaginitis: Secondary | ICD-10-CM | POA: Insufficient documentation

## 2011-12-01 ENCOUNTER — Emergency Department (HOSPITAL_COMMUNITY)
Admission: EM | Admit: 2011-12-01 | Discharge: 2011-12-01 | Disposition: A | Discharge status: Home / Self Care | Payer: 59 | Attending: Emergency Medicine | Admitting: Emergency Medicine

## 2011-12-01 ENCOUNTER — Encounter (HOSPITAL_COMMUNITY): Payer: Self-pay | Admitting: Emergency Medicine

## 2011-12-01 DIAGNOSIS — Z79899 Other long term (current) drug therapy: Secondary | ICD-10-CM | POA: Insufficient documentation

## 2011-12-01 DIAGNOSIS — T50901A Poisoning by unspecified drugs, medicaments and biological substances, accidental (unintentional), initial encounter: Secondary | ICD-10-CM

## 2011-12-01 DIAGNOSIS — Z8701 Personal history of pneumonia (recurrent): Secondary | ICD-10-CM | POA: Insufficient documentation

## 2011-12-01 DIAGNOSIS — T65891A Toxic effect of other specified substances, accidental (unintentional), initial encounter: Secondary | ICD-10-CM | POA: Insufficient documentation

## 2011-12-01 DIAGNOSIS — Y939 Activity, unspecified: Secondary | ICD-10-CM | POA: Insufficient documentation

## 2011-12-01 DIAGNOSIS — T50991A Poisoning by other drugs, medicaments and biological substances, accidental (unintentional), initial encounter: Secondary | ICD-10-CM | POA: Insufficient documentation

## 2011-12-01 DIAGNOSIS — Y9229 Other specified public building as the place of occurrence of the external cause: Secondary | ICD-10-CM | POA: Insufficient documentation

## 2011-12-01 DIAGNOSIS — IMO0002 Reserved for concepts with insufficient information to code with codable children: Secondary | ICD-10-CM | POA: Insufficient documentation

## 2011-12-01 DIAGNOSIS — F3189 Other bipolar disorder: Secondary | ICD-10-CM | POA: Insufficient documentation

## 2011-12-01 HISTORY — DX: Major depressive disorder, single episode, unspecified: F32.9

## 2011-12-01 HISTORY — DX: Depression, unspecified: F32.A

## 2011-12-01 LAB — CBC
HCT: 33.7 % — ABNORMAL LOW (ref 36.0–46.0)
Hemoglobin: 11 g/dL — ABNORMAL LOW (ref 12.0–15.0)
MCV: 82.4 fL (ref 78.0–100.0)
RBC: 4.09 MIL/uL (ref 3.87–5.11)
RDW: 14 % (ref 11.5–15.5)
WBC: 8.7 10*3/uL (ref 4.0–10.5)

## 2011-12-01 LAB — URINALYSIS, ROUTINE W REFLEX MICROSCOPIC
Bilirubin Urine: NEGATIVE
Glucose, UA: NEGATIVE mg/dL
Ketones, ur: NEGATIVE mg/dL
Protein, ur: NEGATIVE mg/dL
Urobilinogen, UA: 0.2 mg/dL (ref 0.0–1.0)

## 2011-12-01 LAB — BASIC METABOLIC PANEL
BUN: 12 mg/dL (ref 6–23)
CO2: 21 mEq/L (ref 19–32)
Chloride: 102 mEq/L (ref 96–112)
Creatinine, Ser: 0.88 mg/dL (ref 0.50–1.10)
GFR calc Af Amer: >90 mL/min (ref >90)
Glucose, Bld: 92 mg/dL (ref 70–99)
Potassium: 3.6 mEq/L (ref 3.5–5.1)

## 2011-12-01 LAB — URINE MICROSCOPIC-ADD ON

## 2011-12-01 LAB — ACETAMINOPHEN LEVEL: Acetaminophen (Tylenol), Serum: <15 ug/mL (ref 10–30)

## 2011-12-01 LAB — RAPID URINE DRUG SCREEN, HOSP PERFORMED
Amphetamines: NOT DETECTED
Barbiturates: NOT DETECTED
Benzodiazepines: NOT DETECTED
Tetrahydrocannabinol: NOT DETECTED

## 2011-12-01 LAB — SALICYLATE LEVEL: Salicylate Lvl: <2 mg/dL — ABNORMAL LOW (ref 2.8–20.0)

## 2011-12-01 MED ORDER — ZIPRASIDONE MESYLATE 20 MG IM SOLR
INTRAMUSCULAR | Status: AC
  Filled 2011-12-01: qty 20

## 2011-12-01 MED ORDER — DIPHENHYDRAMINE HCL (SLEEP) 25 MG PO TABS: 25.0000 mg | ORAL_TABLET | Freq: Every evening | ORAL | Status: DC | PRN

## 2011-12-01 MED ORDER — ONDANSETRON HCL 4 MG PO TABS: 4.0000 mg | ORAL_TABLET | Freq: Three times a day (TID) | ORAL | Status: DC | PRN

## 2011-12-01 MED ORDER — HYDROCORTISONE 1 % EX CREA
TOPICAL_CREAM | Freq: Two times a day (BID) | CUTANEOUS | Status: DC
  Filled 2011-12-01 (×2): qty 28

## 2011-12-01 MED ORDER — ACETAMINOPHEN 325 MG PO TABS: 650.0000 mg | ORAL_TABLET | ORAL | Status: DC | PRN

## 2011-12-01 MED ORDER — ALUM & MAG HYDROXIDE-SIMETH 200-200-20 MG/5ML PO SUSP: 30.0000 mL | ORAL | Status: DC | PRN

## 2011-12-01 MED ORDER — SODIUM CHLORIDE 0.9 % IV BOLUS (SEPSIS)
1000.0000 mL | Freq: Once | INTRAVENOUS | Status: AC
  Administered 2011-12-01: 1000 mL via INTRAVENOUS

## 2011-12-01 MED ORDER — DIPHENHYDRAMINE HCL 25 MG PO CAPS: 25.0000 mg | ORAL_CAPSULE | Freq: Every evening | ORAL | Status: DC | PRN

## 2011-12-01 MED ORDER — IBUPROFEN 600 MG PO TABS: 600.0000 mg | ORAL_TABLET | Freq: Three times a day (TID) | ORAL | Status: DC | PRN

## 2011-12-01 MED ORDER — ZIPRASIDONE MESYLATE 20 MG IM SOLR: 20.0000 mg | Freq: Once | INTRAMUSCULAR | Status: DC

## 2011-12-01 NOTE — ED Notes (Signed)
Up to the desk to call for ride 

## 2011-12-01 NOTE — ED Notes (Signed)
Dr jacobiwitz into see 

## 2011-12-01 NOTE — ED Notes (Signed)
At nursing station getting discharged

## 2011-12-01 NOTE — ED Notes (Signed)
Telepsych in progress. 

## 2011-12-01 NOTE — ED Notes (Signed)
Laying in her bed watching TV
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
## 2011-12-01 NOTE — ED Notes (Addendum)
Written dc instructions and referal sheet reviewed w/ pt.  Pt encouraged to follow up w/ her therapist this week and reports she already has a appt scheduled this week.  Pt encouraged to return/seek treatment for return of suicidal thoughts/urges.  Pt verbalized understanding.  Pt reports she is going to stay w/ friends until her father arrives. Belongings returned after leaving area, Pt ambulatory w/o difficulty.

## 2011-12-01 NOTE — ED Notes (Signed)
Up to the desk on the trying to find a ride home

## 2011-12-01 NOTE — ED Notes (Signed)
Poison Control contacted, spoke with Onalee Hua, updated on Pt status, instructed per PC, supportive care, possible N/V/D, will maintain contact, will check back on pt status.

## 2011-12-01 NOTE — ED Notes (Signed)
Successful transmission report for fax to [facility] with telephone verification as well.

## 2011-12-01 NOTE — ED Notes (Signed)
MaryAnne from poison control called for follow up lab work-they will continue to follow

## 2011-12-01 NOTE — ED Notes (Signed)
Bed:RESA<BR> Expected date:<BR> Expected time:<BR> Means of arrival:<BR> Comments:<BR> EMS

## 2011-12-01 NOTE — ED Notes (Signed)
Patient and belongings screened by security. Patient belongings secured at nurses station.

## 2011-12-01 NOTE — ED Notes (Signed)
At @ 0600 patient got out of restraints.Patient became aggressive towards staff and refusing to cooperate. Pt was re-restrained by staff. Patient continued to kick and scream. Dr. Dierdre Highman notified and orders received for Geodon. Fleet Contras, RN at bedside was able to calm patient. Patient agrees to be calm and cooperative. Verbal contract made with patient, if patient remains calm until 0700 patient will be taken out of restraints. Geodon not administered at this time. Patient talking calmly with Carollee Herter, RN at this time.

## 2011-12-01 NOTE — ED Notes (Signed)
Friend into see 
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
## 2011-12-01 NOTE — ED Provider Notes (Cosign Needed)
Patient reportedly took unknown quantity of dark acid sometime last night she is presently asymptomatic she is not lightheaded on standing she denies wishing to harm herself presently, and did not think she would die as a result of overdose she is alert pleasant cooperative Glasgow Coma Score 15.  Date: 12/01/2011  Rate: 102  Rhythm: sinus tachycardia  QRS Axis: normal  Intervals: normal  ST/T Wave abnormalities: normal  Conduction Disutrbances:none  Narrative Interpretation:   Old EKG Reviewed: none available Results for orders placed during the hospital encounter of 12/01/11  CBC      Component Value Range   WBC 8.7  4.0 - 10.5 K/uL   RBC 4.09  3.87 - 5.11 MIL/uL   Hemoglobin 11.0 (*) 12.0 - 15.0 g/dL   HCT 78.4 (*) 69.6 - 29.5 %   MCV 82.4  78.0 - 100.0 fL   MCH 26.9  26.0 - 34.0 pg   MCHC 32.6  30.0 - 36.0 g/dL   RDW 28.4  13.2 - 44.0 %   Platelets 267  150 - 400 K/uL  BASIC METABOLIC PANEL      Component Value Range   Sodium 136  135 - 145 mEq/L   Potassium 3.6  3.5 - 5.1 mEq/L   Chloride 102  96 - 112 mEq/L   CO2 21  19 - 32 mEq/L   Glucose, Bld 92  70 - 99 mg/dL   BUN 12  6 - 23 mg/dL   Creatinine, Ser 1.02  0.50 - 1.10 mg/dL   Calcium 8.8  8.4 - 72.5 mg/dL   GFR calc non Af Amer >90  >90 mL/min   GFR calc Af Amer >90  >90 mL/min  ETHANOL      Component Value Range   Alcohol, Ethyl (B) 62 (*) 0 - 11 mg/dL  URINALYSIS, ROUTINE W REFLEX MICROSCOPIC      Component Value Range   Color, Urine YELLOW  YELLOW   APPearance CLEAR  CLEAR   Specific Gravity, Urine 1.026  1.005 - 1.030   pH 5.5  5.0 - 8.0   Glucose, UA NEGATIVE  NEGATIVE mg/dL   Hgb urine dipstick MODERATE (*) NEGATIVE   Bilirubin Urine NEGATIVE  NEGATIVE   Ketones, ur NEGATIVE  NEGATIVE mg/dL   Protein, ur NEGATIVE  NEGATIVE mg/dL   Urobilinogen, UA 0.2  0.0 - 1.0 mg/dL   Nitrite NEGATIVE  NEGATIVE   Leukocytes, UA SMALL (*) NEGATIVE  PREGNANCY, URINE      Component Value Range   Preg Test, Ur  NEGATIVE  NEGATIVE  URINE RAPID DRUG SCREEN (HOSP PERFORMED)      Component Value Range   Opiates NONE DETECTED  NONE DETECTED   Cocaine NONE DETECTED  NONE DETECTED   Benzodiazepines NONE DETECTED  NONE DETECTED   Amphetamines NONE DETECTED  NONE DETECTED   Tetrahydrocannabinol NONE DETECTED  NONE DETECTED   Barbiturates NONE DETECTED  NONE DETECTED  URINE MICROSCOPIC-ADD ON      Component Value Range   Squamous Epithelial / LPF RARE  RARE   WBC, UA 3-6  <3 WBC/hpf   Bacteria, UA FEW (*) RARE  SALICYLATE LEVEL      Component Value Range   Salicylate Lvl <2.0 (*) 2.8 - 20.0 mg/dL  ACETAMINOPHEN LEVEL      Component Value Range   Acetaminophen (Tylenol), Serum <15.0  10 - 30 ug/mL   No results found. 250 pm pt alert nad , gcs 15 ate without difficulty in the  ed. Vehememtly denies SI. Given list of resources for outpt psychiatric councilling.   Doug Sou, MD 12/01/11 1453

## 2011-12-01 NOTE — ED Notes (Signed)
Report given to [place], rn and pt has been transferred to psyche Ed.
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
## 2011-12-01 NOTE — ED Notes (Signed)
Pt's father called requesting information on patient. Wants to speak with pt and also wants to  know why pt was admitted. Pt has been transferred to the psych ED. Father therefore given the number to the psyche ED to call pt to provide him info.

## 2011-12-02 LAB — URINE CULTURE

## 2013-03-06 LAB — OB RESULTS CONSOLE GC/CHLAMYDIA
Chlamydia: NEGATIVE
Gonorrhea: NEGATIVE

## 2013-07-31 ENCOUNTER — Ambulatory Visit (INDEPENDENT_AMBULATORY_CARE_PROVIDER_SITE_OTHER): Payer: 59 | Admitting: Family

## 2013-07-31 ENCOUNTER — Encounter: Payer: Self-pay | Admitting: Family

## 2013-07-31 VITALS — BP 127/71 | HR 89 | Wt 202.0 lb

## 2013-07-31 DIAGNOSIS — Z113 Encounter for screening for infections with a predominantly sexual mode of transmission: Secondary | ICD-10-CM

## 2013-07-31 DIAGNOSIS — Z3490 Encounter for supervision of normal pregnancy, unspecified, unspecified trimester: Secondary | ICD-10-CM | POA: Insufficient documentation

## 2013-07-31 DIAGNOSIS — Z3400 Encounter for supervision of normal first pregnancy, unspecified trimester: Secondary | ICD-10-CM

## 2013-07-31 DIAGNOSIS — Z124 Encounter for screening for malignant neoplasm of cervix: Secondary | ICD-10-CM

## 2013-07-31 DIAGNOSIS — Z3401 Encounter for supervision of normal first pregnancy, first trimester: Secondary | ICD-10-CM

## 2013-07-31 NOTE — Progress Notes (Signed)
Subjective:    Tasha Barrett is a G1P0 [redacted]w[redacted]d being seen today for her first obstetrical visit.  Her obstetrical history is significant for obesity and history of anxiety and depression.  Not taking meds at this time and reports stable mood since school has been out.  . Patient does intend to breast feed. Pregnancy history fully reviewed.  Patient reports no bleeding, no contractions and no cramping.  Filed Vitals:   07/31/13 0844  BP: 127/71  Pulse: 89  Weight: 202 lb (91.627 kg)    HISTORY: OB History  Gravida Para Term Preterm AB SAB TAB Ectopic Multiple Living  1             # Outcome Date GA Lbr Len/2nd Weight Sex Delivery Anes PTL Lv  1 CUR              Past Medical History  Diagnosis Date  . Bipolar 2 disorder   . Pneumonia     age 6   . Bipolar disorder   . Depression   . Anxiety     Used Latuda (did not work)   History reviewed. No pertinent past surgical history. Family History  Problem Relation Age of Onset  . Anesthesia problems Neg Hx   . Hypotension Neg Hx   . Malignant hyperthermia Neg Hx   . Pseudochol deficiency Neg Hx   . Depression Mother   . Gout Father      Exam   Exam   BP 127/71  Pulse 89  Wt 202 lb (91.627 kg)  LMP 05/27/2013 Uterine Size: size equals dates  Pelvic Exam:    Perineum: No Hemorrhoids, Normal Perineum   Vulva: normal   Vagina:  normal mucosa, normal discharge, no palpable nodules   pH: Not done   Cervix: no bleeding following Pap, no cervical motion tenderness and no lesions   Adnexa: normal adnexa and no mass, fullness, tenderness   Bony Pelvis: Adequate  System: Breast:  No nipple retraction or dimpling, No nipple discharge or bleeding, No axillary or supraclavicular adenopathy, Normal to palpation without dominant masses.  Pendulous breasts.     Skin: normal coloration and turgor, no rashes    Neurologic: negative   Extremities: normal strength, tone, and muscle mass   HEENT neck supple with midline  trachea and thyroid without masses   Mouth/Teeth mucous membranes moist, pharynx normal without lesions   Neck supple and no masses   Cardiovascular: regular rate and rhythm, no murmurs or gallops   Respiratory:  appears well, vitals normal, no respiratory distress, acyanotic, normal RR, neck free of mass or lymphadenopathy, chest clear, no wheezing, crepitations, rhonchi, normal symmetric air entry   Abdomen: soft, non-tender; bowel sounds normal; no masses,  no organomegaly   Urinary: urethral meatus normal      Assessment:    Pregnancy:   23 yo G1P0 at [redacted]w[redacted]d wks by certain LMP Hx of Bipolar - not requiring meds        Plan:     Initial labs drawn.  Pap smear collected.  Reviewed practice information and OB visit schedule.  Explained that we can't guarantee a midwife at delivery.  Recommended getting a doula for labor support.  Emphasized the benefits of our practice (successful vaginal delivery rate) and the excellent providers on staff.   Prenatal vitamins. Problem list reviewed and updated. Genetic Screening discussed First Screen: ordered.  Ultrasound discussed; fetal survey: will schedule at 19-20 wks..  Follow up in 4 weeks.  [facility] 07/31/2013

## 2013-07-31 NOTE — Progress Notes (Signed)
Bedside U/S showed IUP with FHT of 173 and CRL 27.76mm and GA of [redacted]w[redacted]d

## 2013-08-01 LAB — OBSTETRIC PANEL
Antibody Screen: NEGATIVE
BASOS ABS: 0 10*3/uL (ref 0.0–0.1)
Basophils Relative: 0 % (ref 0–1)
EOS PCT: 1 % (ref 0–5)
Eosinophils Absolute: 0.1 10*3/uL (ref 0.0–0.7)
HEMATOCRIT: 33.1 % — AB (ref 36.0–46.0)
Hemoglobin: 11 g/dL — ABNORMAL LOW (ref 12.0–15.0)
Hepatitis B Surface Ag: NEGATIVE
LYMPHS ABS: 1.6 10*3/uL (ref 0.7–4.0)
LYMPHS PCT: 18 % (ref 12–46)
MCH: 26.6 pg (ref 26.0–34.0)
MCHC: 33.2 g/dL (ref 30.0–36.0)
MCV: 80.1 fL (ref 78.0–100.0)
Monocytes Absolute: 0.6 10*3/uL (ref 0.1–1.0)
Monocytes Relative: 7 % (ref 3–12)
NEUTROS ABS: 6.7 10*3/uL (ref 1.7–7.7)
Neutrophils Relative %: 74 % (ref 43–77)
PLATELETS: 220 10*3/uL (ref 150–400)
RBC: 4.13 MIL/uL (ref 3.87–5.11)
RDW: 14.2 % (ref 11.5–15.5)
RUBELLA: 1.24 {index} — AB (ref <0.90)
Rh Type: POSITIVE
WBC: 9.1 10*3/uL (ref 4.0–10.5)

## 2013-08-01 LAB — SICKLE CELL SCREEN: SICKLE CELL SCREEN: NEGATIVE

## 2013-08-01 LAB — HIV ANTIBODY (ROUTINE TESTING W REFLEX): HIV 1&2 Ab, 4th Generation: NONREACTIVE

## 2013-08-03 ENCOUNTER — Encounter: Payer: Self-pay | Admitting: *Deleted

## 2013-08-03 LAB — CULTURE, URINE COMPREHENSIVE

## 2013-08-03 LAB — CYTOLOGY - PAP

## 2013-08-15 ENCOUNTER — Other Ambulatory Visit: Payer: Self-pay | Admitting: Family

## 2013-08-15 MED ORDER — NITROFURANTOIN MONOHYD MACRO 100 MG PO CAPS: 100.0000 mg | ORAL_CAPSULE | Freq: Two times a day (BID) | ORAL | Status: DC

## 2013-08-18 ENCOUNTER — Telehealth: Payer: Self-pay | Admitting: *Deleted

## 2013-08-18 DIAGNOSIS — N390 Urinary tract infection, site not specified: Secondary | ICD-10-CM

## 2013-08-18 MED ORDER — NITROFURANTOIN MONOHYD MACRO 100 MG PO CAPS: 100.0000 mg | ORAL_CAPSULE | Freq: Two times a day (BID) | ORAL | Status: DC

## 2013-08-18 NOTE — Telephone Encounter (Signed)
Pt aware of UTI and wishes to change pharmacy location to [facility].

## 2013-08-18 NOTE — Telephone Encounter (Signed)
Message copied by Asencion Islam on Tue Aug 18, 2013 10:08 AM ------      Message from: Joelyn Oms      Created: Sat Aug 15, 2013  2:57 AM      Regarding: UTI RX Macrobid       Please call in RX for Macrobid to pharmacy for pt.  Thx!            [id number]      ----- Message -----         From: Lab in [facility]         Sent: 07/31/2013   6:55 PM           To: Joelyn Oms, CNM                   ------

## 2013-08-20 ENCOUNTER — Telehealth: Payer: Self-pay | Admitting: *Deleted

## 2013-08-20 DIAGNOSIS — N390 Urinary tract infection, site not specified: Secondary | ICD-10-CM

## 2013-08-20 MED ORDER — NITROFURANTOIN MONOHYD MACRO 100 MG PO CAPS: 100.0000 mg | ORAL_CAPSULE | Freq: Two times a day (BID) | ORAL | Status: DC

## 2013-08-20 NOTE — Telephone Encounter (Signed)
Pt's original RX for Macrobid was resent due to error.

## 2013-08-25 ENCOUNTER — Other Ambulatory Visit (HOSPITAL_COMMUNITY): Payer: 59

## 2013-08-25 ENCOUNTER — Ambulatory Visit (HOSPITAL_COMMUNITY): Payer: 59

## 2013-08-26 ENCOUNTER — Ambulatory Visit (HOSPITAL_COMMUNITY)
Admission: RE | Admit: 2013-08-26 | Discharge: 2013-08-26 | Disposition: A | Discharge status: Home / Self Care | Payer: 59 | Source: Ambulatory Visit | Attending: Family | Admitting: Family

## 2013-08-26 ENCOUNTER — Other Ambulatory Visit: Payer: Self-pay

## 2013-08-26 ENCOUNTER — Encounter (HOSPITAL_COMMUNITY): Payer: Self-pay

## 2013-08-26 DIAGNOSIS — Z3490 Encounter for supervision of normal pregnancy, unspecified, unspecified trimester: Secondary | ICD-10-CM

## 2013-08-26 DIAGNOSIS — Z36 Encounter for antenatal screening of mother: Secondary | ICD-10-CM | POA: Diagnosis present

## 2013-08-27 ENCOUNTER — Encounter: Payer: Self-pay | Admitting: Family

## 2013-08-28 ENCOUNTER — Ambulatory Visit (INDEPENDENT_AMBULATORY_CARE_PROVIDER_SITE_OTHER): Payer: 59 | Admitting: Family

## 2013-08-28 VITALS — BP 136/68 | HR 83 | Wt 199.0 lb

## 2013-08-28 DIAGNOSIS — Z3480 Encounter for supervision of other normal pregnancy, unspecified trimester: Secondary | ICD-10-CM

## 2013-08-28 DIAGNOSIS — Z3492 Encounter for supervision of normal pregnancy, unspecified, second trimester: Secondary | ICD-10-CM

## 2013-08-28 NOTE — Progress Notes (Signed)
Doing well; reviewed new OB labs and first screen results.  Discussed will schedule ultrasound at next visit for 19 wks.  Obtain AFP at next visit.

## 2013-09-02 ENCOUNTER — Encounter: Payer: Self-pay | Admitting: *Deleted

## 2013-09-03 ENCOUNTER — Encounter: Payer: Self-pay | Admitting: Obstetrics & Gynecology

## 2013-09-22 ENCOUNTER — Encounter (HOSPITAL_COMMUNITY): Payer: Self-pay | Admitting: *Deleted

## 2013-09-22 ENCOUNTER — Inpatient Hospital Stay (HOSPITAL_COMMUNITY)
Admission: AD | Admit: 2013-09-22 | Discharge: 2013-09-22 | Disposition: A | Discharge status: Home / Self Care | Payer: 59 | Source: Ambulatory Visit | Attending: Obstetrics & Gynecology | Admitting: Obstetrics & Gynecology

## 2013-09-22 DIAGNOSIS — O99891 Other specified diseases and conditions complicating pregnancy: Secondary | ICD-10-CM

## 2013-09-22 DIAGNOSIS — O219 Vomiting of pregnancy, unspecified: Secondary | ICD-10-CM

## 2013-09-22 DIAGNOSIS — K5900 Constipation, unspecified: Secondary | ICD-10-CM | POA: Diagnosis present

## 2013-09-22 DIAGNOSIS — O99612 Diseases of the digestive system complicating pregnancy, second trimester: Secondary | ICD-10-CM

## 2013-09-22 DIAGNOSIS — O210 Mild hyperemesis gravidarum: Secondary | ICD-10-CM | POA: Insufficient documentation

## 2013-09-22 DIAGNOSIS — O9989 Other specified diseases and conditions complicating pregnancy, childbirth and the puerperium: Principal | ICD-10-CM

## 2013-09-22 HISTORY — DX: Candidiasis, unspecified: B37.9

## 2013-09-22 LAB — URINALYSIS, ROUTINE W REFLEX MICROSCOPIC
Bilirubin Urine: NEGATIVE
Glucose, UA: NEGATIVE mg/dL
Hgb urine dipstick: NEGATIVE
KETONES UR: NEGATIVE mg/dL
LEUKOCYTES UA: NEGATIVE
NITRITE: NEGATIVE
PROTEIN: NEGATIVE mg/dL
Specific Gravity, Urine: 1.01 (ref 1.005–1.030)
UROBILINOGEN UA: 0.2 mg/dL (ref 0.0–1.0)
pH: 6.5 (ref 5.0–8.0)

## 2013-09-22 MED ORDER — IBUPROFEN 600 MG PO TABS
600.0000 mg | ORAL_TABLET | Freq: Once | ORAL | Status: AC
  Administered 2013-09-22: 600 mg via ORAL
  Filled 2013-09-22: qty 1

## 2013-09-22 MED ORDER — METOCLOPRAMIDE HCL 10 MG PO TABS
10.0000 mg | ORAL_TABLET | Freq: Once | ORAL | Status: AC
  Administered 2013-09-22: 10 mg via ORAL
  Filled 2013-09-22: qty 1

## 2013-09-22 MED ORDER — METOCLOPRAMIDE HCL 10 MG PO TABS: 10.0000 mg | ORAL_TABLET | Freq: Four times a day (QID) | ORAL | Status: DC

## 2013-09-22 MED ORDER — POLYETHYLENE GLYCOL 3350 17 G PO PACK: 17.0000 g | PACK | Freq: Every day | ORAL | Status: DC

## 2013-09-22 NOTE — MAU Provider Note (Signed)
History     CSN: [id number]  Arrival date and time: 09/22/13 1708   First Provider Initiated Contact with Patient 09/22/13 1820      Chief Complaint  Patient presents with  . Constipation   HPI 23 y.o. G1P0 at [redacted]w[redacted]d w/ "severe constipation", no BM x 2 days, c/o abdominal bloating. 2 episodes of vomiting in the past 24 hours, has been able to keep down some liquids and small amounts of food. Hasn't taken anything for constipation, has no meds at home for n/v. States that her coworkers have also been sick and vomiting at work. Also c/o intermittent headaches since onset of pregnancy, hasn't taken anything for headaches. Denies fever, chills, vaginal bleeding or d/c, cramping/contractions.   Past Medical History  Diagnosis Date  . Bipolar 2 disorder   . Pneumonia     age 86   . Bipolar disorder   . Depression   . Anxiety     Used Latuda (did not work)  . Yeast infection     History reviewed. No pertinent past surgical history.  Family History  Problem Relation Age of Onset  . Anesthesia problems Neg Hx   . Hypotension Neg Hx   . Malignant hyperthermia Neg Hx   . Pseudochol deficiency Neg Hx   . Depression Mother   . Gout Father     History  Substance Use Topics  . Smoking status: Never Smoker   . Smokeless tobacco: Never Used  . Alcohol Use: No    Allergies:  Allergies  Allergen Reactions  . Amoxicillin-Pot Clavulanate Other (See Comments)    Reaction childhood  . Latex     Possible yeast infection.    Prescriptions prior to admission  Medication Sig Dispense Refill  . Prenatal Multivit-Min-Fe-FA (PRENATAL VITAMINS PO) Take by mouth.        Review of Systems  Constitutional: Negative.   Respiratory: Negative.   Cardiovascular: Negative.   Gastrointestinal: Positive for nausea, vomiting, abdominal pain and constipation. Negative for diarrhea.  Genitourinary: Negative for dysuria, urgency, frequency, hematuria and flank pain.       Negative for vaginal  bleeding, vaginal discharge, cramping   Musculoskeletal: Negative.   Neurological: Negative.   Psychiatric/Behavioral: Negative.    Physical Exam   Blood pressure 107/54, pulse 89, temperature 99.2 F (37.3 C), temperature source Oral, resp. rate 18, last menstrual period 05/27/2013.  Physical Exam  Nursing note and vitals reviewed. Constitutional: She is oriented to person, place, and time. She appears well-developed and well-nourished. No distress.  Cardiovascular: Normal rate.   Respiratory: Effort normal.  GI: Soft. Bowel sounds are normal. She exhibits no distension and no mass. There is tenderness (mild, diffuse). There is no rebound and no guarding.  Musculoskeletal: Normal range of motion.  Neurological: She is alert and oriented to person, place, and time.  Skin: Skin is dry.  Psychiatric: She has a normal mood and affect.    MAU Course  Procedures Results for orders placed during the hospital encounter of 09/22/13 (from the past 24 hour(s))  URINALYSIS, ROUTINE W REFLEX MICROSCOPIC     Status: None   Collection Time    09/22/13  5:50 PM      Result Value Ref Range   Color, Urine YELLOW  YELLOW   APPearance CLEAR  CLEAR   Specific Gravity, Urine 1.010  1.005 - 1.030   pH 6.5  5.0 - 8.0   Glucose, UA NEGATIVE  NEGATIVE mg/dL   Hgb urine dipstick NEGATIVE  NEGATIVE   Bilirubin Urine NEGATIVE  NEGATIVE   Ketones, ur NEGATIVE  NEGATIVE mg/dL   Protein, ur NEGATIVE  NEGATIVE mg/dL   Urobilinogen, UA 0.2  0.0 - 1.0 mg/dL   Nitrite NEGATIVE  NEGATIVE   Leukocytes, UA NEGATIVE  NEGATIVE   Reglan for n/v, Motrin for headache in MAU. Tolerating PO fluids and crackers.   Assessment and Plan   1. Constipation in pregnancy in second trimester   2. Nausea and vomiting during pregnancy prior to [redacted] weeks gestation   Miralax at home for constipation, rx reglan for n/v, may take motrin for headache OTC until 3rd trimester. F/U at scheduled visit on Friday or sooner w/  worsening sx.     Medication List         metoCLOPramide 10 MG tablet  Commonly known as:  REGLAN  Take 1 tablet (10 mg total) by mouth 4 (four) times daily.     polyethylene glycol packet  Commonly known as:  MIRALAX / GLYCOLAX  Take 17 g by mouth daily.     PRENATAL VITAMINS PO  Take by mouth.            Follow-up Information   Follow up with [facility]. (as scheduled or sooner as needed)    Specialty:  Obstetrics and Gynecology   Contact information:   [place] [address] [phone]        [facility] 09/22/2013, 6:29 PM

## 2013-09-22 NOTE — MAU Note (Addendum)
Patient C/O severe constipation and bloating. Had a BM 2 days ago. Vomiting since last night. Difficulty passing urine also. Today has been able to eat some saltines, gingerale, coconut water, some french fries and a couple of chicken nuggets. Also C/O migraine. Has not taken any medications for constipation or HA. Denies hx of migraines, has occurred with pregnancy.

## 2013-09-22 NOTE — MAU Provider Note (Signed)
Attestation of Attending Supervision of Advanced Practitioner (PA/CNM/NP): Evaluation and management procedures were performed by the Advanced Practitioner under my supervision and collaboration.  I have reviewed the Advanced Practitioner's note and chart, and I agree with the management and plan.  Rosaria Kubin, MD, FACOG Attending Obstetrician & Gynecologist Faculty Practice, [facility]   

## 2013-09-25 ENCOUNTER — Ambulatory Visit (INDEPENDENT_AMBULATORY_CARE_PROVIDER_SITE_OTHER): Payer: 59 | Admitting: Advanced Practice Midwife

## 2013-09-25 ENCOUNTER — Telehealth: Payer: Self-pay | Admitting: Advanced Practice Midwife

## 2013-09-25 VITALS — BP 113/65 | HR 77 | Wt 200.0 lb

## 2013-09-25 DIAGNOSIS — Z3402 Encounter for supervision of normal first pregnancy, second trimester: Secondary | ICD-10-CM

## 2013-09-25 DIAGNOSIS — Z3400 Encounter for supervision of normal first pregnancy, unspecified trimester: Secondary | ICD-10-CM

## 2013-09-25 NOTE — Progress Notes (Signed)
Pt has some vaginal itching.

## 2013-09-25 NOTE — Progress Notes (Signed)
Doing well.  Denies vaginal bleeding, LOF, regular contractions.  Anatomy U/S ordered. No urine collected today. Pt denies urinary symptoms/pain.

## 2013-09-25 NOTE — Telephone Encounter (Signed)
Pt reported vaginal itching to RN.  Called pt after visit to discuss.  Pt was on the other line so plan for pt to return call.   Offer Diflucan 150 mg x 2 doses (take 1 now and 1 in 48 hours) or Terazol 7 cream for 7 nights.  Return to office if symptoms persist.

## 2013-09-28 LAB — ALPHA FETOPROTEIN, MATERNAL
AFP: 60.8 [IU]/mL
Curr Gest Age: 17.2 wks.days
MoM for AFP: 2.14
OPEN SPINA BIFIDA: NEGATIVE
Osb Risk: 1:539 {titer}

## 2013-09-29 ENCOUNTER — Telehealth: Payer: Self-pay | Admitting: *Deleted

## 2013-09-29 DIAGNOSIS — B373 Candidiasis of vulva and vagina: Secondary | ICD-10-CM

## 2013-09-29 DIAGNOSIS — B3731 Acute candidiasis of vulva and vagina: Secondary | ICD-10-CM

## 2013-09-29 MED ORDER — FLUCONAZOLE 150 MG PO TABS: 150.0000 mg | ORAL_TABLET | Freq: Once | ORAL | Status: DC

## 2013-09-29 NOTE — Telephone Encounter (Signed)
Per Danelle Berry, CNM RX sent to [facility] for Diflucan 150 mg x 2

## 2013-10-05 ENCOUNTER — Encounter: Payer: Self-pay | Admitting: *Deleted

## 2013-10-05 ENCOUNTER — Telehealth: Payer: Self-pay | Admitting: *Deleted

## 2013-10-05 DIAGNOSIS — F319 Bipolar disorder, unspecified: Secondary | ICD-10-CM | POA: Insufficient documentation

## 2013-10-05 DIAGNOSIS — Z62810 Personal history of physical and sexual abuse in childhood: Secondary | ICD-10-CM | POA: Insufficient documentation
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
## 2013-10-07 ENCOUNTER — Ambulatory Visit (HOSPITAL_COMMUNITY)
Admission: RE | Admit: 2013-10-07 | Discharge: 2013-10-07 | Disposition: A | Discharge status: Home / Self Care | Payer: 59 | Source: Ambulatory Visit | Attending: Advanced Practice Midwife | Admitting: Advanced Practice Midwife

## 2013-10-07 ENCOUNTER — Other Ambulatory Visit: Payer: Self-pay | Admitting: Advanced Practice Midwife

## 2013-10-07 DIAGNOSIS — Z3402 Encounter for supervision of normal first pregnancy, second trimester: Secondary | ICD-10-CM

## 2013-10-07 DIAGNOSIS — Z1389 Encounter for screening for other disorder: Secondary | ICD-10-CM | POA: Insufficient documentation

## 2013-10-07 DIAGNOSIS — Z3689 Encounter for other specified antenatal screening: Secondary | ICD-10-CM | POA: Insufficient documentation

## 2013-10-07 DIAGNOSIS — Z363 Encounter for antenatal screening for malformations: Secondary | ICD-10-CM | POA: Insufficient documentation

## 2013-10-13 ENCOUNTER — Telehealth: Payer: Self-pay

## 2013-10-13 NOTE — Telephone Encounter (Signed)
Patient called with concerns. She has been feeling sick with flu like symptoms. Wanted to know if Nyquil was save to take. Advised her the medications she could take save in pregnancy and also told her to stay hydrated and plenty of rest. She had a tempeture and advice to go to MAU if symptoms worsen. Also advised to call here for any other concerns.

## 2013-10-23 ENCOUNTER — Encounter: Payer: Self-pay | Admitting: Obstetrics and Gynecology

## 2013-10-23 ENCOUNTER — Ambulatory Visit (INDEPENDENT_AMBULATORY_CARE_PROVIDER_SITE_OTHER): Payer: 59 | Admitting: Obstetrics and Gynecology

## 2013-10-23 VITALS — BP 108/63 | HR 75 | Wt 203.0 lb

## 2013-10-23 DIAGNOSIS — O3412 Maternal care for benign tumor of corpus uteri, second trimester: Principal | ICD-10-CM

## 2013-10-23 DIAGNOSIS — D259 Leiomyoma of uterus, unspecified: Secondary | ICD-10-CM | POA: Insufficient documentation

## 2013-10-23 DIAGNOSIS — Z3480 Encounter for supervision of other normal pregnancy, unspecified trimester: Secondary | ICD-10-CM

## 2013-10-23 DIAGNOSIS — O3410 Maternal care for benign tumor of corpus uteri, unspecified trimester: Secondary | ICD-10-CM

## 2013-10-23 NOTE — Progress Notes (Signed)
Plans PN classes and Waterbirth class later. Explained incidental finding small fibroids. States on antidepressant years ago and doing well now. Plans to stay home with baby but still working.

## 2013-10-23 NOTE — Patient Instructions (Signed)

## 2013-11-09 ENCOUNTER — Encounter: Payer: Self-pay | Admitting: Obstetrics & Gynecology

## 2013-11-09 ENCOUNTER — Ambulatory Visit (INDEPENDENT_AMBULATORY_CARE_PROVIDER_SITE_OTHER): Payer: 59 | Admitting: Obstetrics & Gynecology

## 2013-11-09 VITALS — BP 128/72 | HR 85 | Wt 209.0 lb

## 2013-11-09 DIAGNOSIS — R102 Pelvic and perineal pain: Secondary | ICD-10-CM

## 2013-11-09 DIAGNOSIS — D259 Leiomyoma of uterus, unspecified: Secondary | ICD-10-CM

## 2013-11-09 DIAGNOSIS — O99210 Obesity complicating pregnancy, unspecified trimester: Secondary | ICD-10-CM

## 2013-11-09 DIAGNOSIS — E669 Obesity, unspecified: Secondary | ICD-10-CM

## 2013-11-09 DIAGNOSIS — O3412 Maternal care for benign tumor of corpus uteri, second trimester: Secondary | ICD-10-CM

## 2013-11-09 DIAGNOSIS — Z3490 Encounter for supervision of normal pregnancy, unspecified, unspecified trimester: Secondary | ICD-10-CM

## 2013-11-09 LAB — POCT CBG (FASTING - GLUCOSE)-MANUAL ENTRY: Glucose Fasting, POC: 64 mg/dL — AB (ref 70–99)

## 2013-11-09 NOTE — Progress Notes (Signed)
She is here because she thinks that she might have a yeast infection. 3 days ago she started having intense vulvar pain and has not been wearing panties for that reason. She has been smearing diaper cream on the entire area. She gives a history of this happening in the past about 3 years ago. She has never been told that she has HSV. On exam, the labia minor are excoriated and exquisitely tender. There are raised lesions in both of her groins, but many more in the right one, suspicious but diagnostic for condylomaI noted that her early glucola has not been done, so I offered to do it today, but she prefers not to as she just ate "a whole box of Whoppers". We will plan on doing her glucola in 2 weeks. RBS today is 64. I will check HSV 2 IgG and IgM and treat appropriately. I placed a swab in her vagina to send a wet prep as she could not tolerate a speculum.

## 2013-11-09 NOTE — Progress Notes (Signed)
She thinks she has either a yeast or BV today

## 2013-11-10 ENCOUNTER — Telehealth: Payer: Self-pay | Admitting: *Deleted

## 2013-11-10 LAB — WET PREP, GENITAL
CLUE CELLS WET PREP: NONE SEEN
TRICH WET PREP: NONE SEEN

## 2013-11-10 NOTE — Telephone Encounter (Signed)
Pt notified of wet prep results.  She does have a RX for Diflucan at her pharmacy from an August encounter that she never picked up.  She has called the pharmacy and will pick the prescription up today.  I will notify her tomorrow when the HSV blood work is available.

## 2013-11-11 LAB — HSV(HERPES SIMPLEX VRS) I + II AB-IGM: HERPES SIMPLEX VRS I-IGM AB (EIA): 0.45 {index}

## 2013-11-11 LAB — HSV 2 ANTIBODY, IGG

## 2013-11-12 ENCOUNTER — Encounter: Payer: Self-pay | Admitting: Obstetrics & Gynecology

## 2013-11-12 ENCOUNTER — Ambulatory Visit (INDEPENDENT_AMBULATORY_CARE_PROVIDER_SITE_OTHER): Payer: 59 | Admitting: Obstetrics & Gynecology

## 2013-11-12 VITALS — BP 122/73 | HR 100 | Wt 210.0 lb

## 2013-11-12 DIAGNOSIS — Z3492 Encounter for supervision of normal pregnancy, unspecified, second trimester: Secondary | ICD-10-CM

## 2013-11-12 DIAGNOSIS — L309 Dermatitis, unspecified: Secondary | ICD-10-CM

## 2013-11-12 DIAGNOSIS — N7689 Other specified inflammation of vagina and vulva: Secondary | ICD-10-CM

## 2013-11-12 MED ORDER — TRIAMCINOLONE ACETONIDE 0.025 % EX OINT: 1.0000 "application " | TOPICAL_OINTMENT | Freq: Two times a day (BID) | CUTANEOUS | Status: DC

## 2013-11-12 NOTE — Addendum Note (Signed)
Addended by: Asencion Islam on: 11/12/2013 04:10 PM   Modules accepted: Orders

## 2013-11-12 NOTE — Progress Notes (Signed)
Has vaginal bumps.  Had HSV testing earlier which was neg.  Pt is doing her 1 hr GTT today

## 2013-11-12 NOTE — Progress Notes (Signed)
Pt having continued vulvar complaints.  Pt has shaved and broke out.  She has used corn starch powder, witch hazel, alovera gel, hydrocortisone cream, and butt paste.  Pt couldn't get the butt paste off and had to use dawn to get the butt paste.  Pt had yeast last week and has been treated.  HSV negative last week.  Today the vulvar and inguianl regionsal are dry, white and flaking.  The skin looks lichenified.  There is a question of diffuse condyloma, but resolving dermatitis is more likely.  Vulvar hygiene reviewed.  Cetaphil for cleansing.  Kenalog ointment bid for one week.  RTC in one week to me.

## 2013-11-13 ENCOUNTER — Telehealth: Payer: Self-pay | Admitting: *Deleted

## 2013-11-13 LAB — GLUCOSE TOLERANCE, 1 HOUR (50G) W/O FASTING: Glucose, 1 Hour GTT: 132 mg/dL (ref 70–140)

## 2013-11-13 NOTE — Telephone Encounter (Signed)
Pt notified of normal 1 hr GTT.

## 2013-11-18 ENCOUNTER — Encounter: Payer: Self-pay | Admitting: Obstetrics & Gynecology

## 2013-11-20 ENCOUNTER — Encounter: Payer: Self-pay | Admitting: Obstetrics & Gynecology

## 2013-11-20 ENCOUNTER — Ambulatory Visit (INDEPENDENT_AMBULATORY_CARE_PROVIDER_SITE_OTHER): Payer: 59 | Admitting: Obstetrics & Gynecology

## 2013-11-20 VITALS — BP 121/68 | HR 97 | Wt 212.0 lb

## 2013-11-20 DIAGNOSIS — Z23 Encounter for immunization: Secondary | ICD-10-CM

## 2013-11-20 DIAGNOSIS — Z3492 Encounter for supervision of normal pregnancy, unspecified, second trimester: Secondary | ICD-10-CM

## 2013-11-20 NOTE — Progress Notes (Signed)
Routine visit. Good FM. Flu vaccine today. Tdap, labs at next visit. Her vulva appears entirely normal today!

## 2013-11-25 ENCOUNTER — Ambulatory Visit (INDEPENDENT_AMBULATORY_CARE_PROVIDER_SITE_OTHER): Payer: 59 | Admitting: Obstetrics & Gynecology

## 2013-11-25 VITALS — BP 114/68 | HR 82 | Wt 215.0 lb

## 2013-11-25 DIAGNOSIS — N9089 Other specified noninflammatory disorders of vulva and perineum: Secondary | ICD-10-CM

## 2013-11-25 NOTE — Progress Notes (Signed)
Work in visit. Her vulvar discomfort and bumps have returned. They look today like they did when I first saw her 11-09-13. She has not used any other products on her vulva. I did a speculum exam and sent a specimen for BD Affirm test. I did a biopsy of the bumps on her right groin/upper thigh. She tolerated this well.

## 2013-11-26 LAB — GC/CHLAMYDIA PROBE AMP
CT Probe RNA: NEGATIVE
GC PROBE AMP APTIMA: NEGATIVE

## 2013-11-26 LAB — WET PREP BY MOLECULAR PROBE
Candida species: NEGATIVE
Gardnerella vaginalis: NEGATIVE
Trichomonas vaginosis: NEGATIVE

## 2013-11-30 ENCOUNTER — Telehealth: Payer: Self-pay | Admitting: *Deleted

## 2013-11-30 DIAGNOSIS — B372 Candidiasis of skin and nail: Secondary | ICD-10-CM

## 2013-11-30 MED ORDER — FLUCONAZOLE 150 MG PO TABS: ORAL_TABLET | ORAL | Status: DC

## 2013-11-30 NOTE — Telephone Encounter (Signed)
Pt aware of biopsy report and RX sent to [facility] for Diflucan 150 mg take 1 PO every three days per Dr Hulan Fray.

## 2013-12-07 ENCOUNTER — Encounter: Payer: Self-pay | Admitting: Obstetrics & Gynecology

## 2013-12-11 ENCOUNTER — Ambulatory Visit (INDEPENDENT_AMBULATORY_CARE_PROVIDER_SITE_OTHER): Payer: 59 | Admitting: Family

## 2013-12-11 VITALS — BP 132/75 | HR 103 | Wt 217.0 lb

## 2013-12-11 DIAGNOSIS — Z3492 Encounter for supervision of normal pregnancy, unspecified, second trimester: Secondary | ICD-10-CM

## 2013-12-11 DIAGNOSIS — Z3483 Encounter for supervision of other normal pregnancy, third trimester: Secondary | ICD-10-CM

## 2013-12-11 DIAGNOSIS — N898 Other specified noninflammatory disorders of vagina: Secondary | ICD-10-CM

## 2013-12-11 DIAGNOSIS — Z23 Encounter for immunization: Secondary | ICD-10-CM

## 2013-12-11 LAB — CBC
HCT: 29.1 % — ABNORMAL LOW (ref 36.0–46.0)
Hemoglobin: 9.5 g/dL — ABNORMAL LOW (ref 12.0–15.0)
MCH: 26.2 pg (ref 26.0–34.0)
MCHC: 32.6 g/dL (ref 30.0–36.0)
MCV: 80.2 fL (ref 78.0–100.0)
PLATELETS: 224 10*3/uL (ref 150–400)
RBC: 3.63 MIL/uL — ABNORMAL LOW (ref 3.87–5.11)
RDW: 14.3 % (ref 11.5–15.5)
WBC: 10 10*3/uL (ref 4.0–10.5)

## 2013-12-11 NOTE — Progress Notes (Signed)
Pt states she started having vaginal itching and white discharge this morning and would like to be checked.

## 2013-12-11 NOTE — Progress Notes (Signed)
Reports white vaginal discharge, with itching.  Seen by Dr. Hulan Fray in October, biopsy obtained due to vaginal lesions > candida.  Wet prep collected today.  Exam - whitened area of labia, +white discharge.  Discussed diet and eating.  If eating appropriate caloric intake and fetus is growing appropirately weight loss during pregnancy is not considered a "bad thing",   Plans to hire a doula for birth.  Appointment with therapist today due to piror history of depression.  Third trimester labs today.

## 2013-12-12 LAB — GLUCOSE TOLERANCE, 1 HOUR (50G) W/O FASTING: GLUCOSE 1 HOUR GTT: 86 mg/dL (ref 70–140)

## 2013-12-12 LAB — WET PREP, GENITAL
Clue Cells Wet Prep HPF POC: NONE SEEN
Trich, Wet Prep: NONE SEEN
WBC WET PREP: NONE SEEN
Yeast Wet Prep HPF POC: NONE SEEN

## 2013-12-12 LAB — HIV ANTIBODY (ROUTINE TESTING W REFLEX): HIV: NONREACTIVE

## 2013-12-12 LAB — RPR

## 2013-12-23 ENCOUNTER — Ambulatory Visit (INDEPENDENT_AMBULATORY_CARE_PROVIDER_SITE_OTHER): Payer: 59 | Admitting: Obstetrics & Gynecology

## 2013-12-23 ENCOUNTER — Encounter: Payer: Self-pay | Admitting: Obstetrics & Gynecology

## 2013-12-23 VITALS — BP 121/68 | HR 90 | Wt 219.0 lb

## 2013-12-23 DIAGNOSIS — Z3493 Encounter for supervision of normal pregnancy, unspecified, third trimester: Secondary | ICD-10-CM

## 2013-12-23 MED ORDER — FLUCONAZOLE 150 MG PO TABS: 150.0000 mg | ORAL_TABLET | Freq: Once | ORAL | Status: DC

## 2013-12-23 NOTE — Progress Notes (Signed)
Routine visit. Good FM. No problems. I have e prescribed another diflucan for prn use.

## 2013-12-25 ENCOUNTER — Encounter: Payer: 59 | Admitting: Advanced Practice Midwife

## 2014-01-06 ENCOUNTER — Encounter: Payer: Self-pay | Admitting: Obstetrics & Gynecology

## 2014-01-06 ENCOUNTER — Ambulatory Visit (INDEPENDENT_AMBULATORY_CARE_PROVIDER_SITE_OTHER): Payer: 59 | Admitting: Obstetrics & Gynecology

## 2014-01-06 VITALS — BP 120/68 | HR 88 | Wt 226.0 lb

## 2014-01-06 DIAGNOSIS — F508 Other eating disorders: Secondary | ICD-10-CM

## 2014-01-06 DIAGNOSIS — Z3490 Encounter for supervision of normal pregnancy, unspecified, unspecified trimester: Secondary | ICD-10-CM

## 2014-01-06 MED ORDER — FUSION PLUS PO CAPS: 1.0000 | ORAL_CAPSULE | Freq: Every day | ORAL | Status: DC

## 2014-01-06 NOTE — Progress Notes (Signed)
Routine visit. Good FM. No problems.  

## 2014-01-06 NOTE — Progress Notes (Signed)
Pt states that she is craving paper.  Her Hgb is 9.5 yet she is not taking her iron.  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  RX sent to pharmacy for Fusion plus.  Pt agrees to try the iron again.

## 2014-01-08 ENCOUNTER — Encounter: Payer: 59 | Admitting: Family

## 2014-01-22 ENCOUNTER — Ambulatory Visit (INDEPENDENT_AMBULATORY_CARE_PROVIDER_SITE_OTHER): Payer: 59 | Admitting: Family

## 2014-01-22 VITALS — BP 133/79 | HR 101 | Wt 227.0 lb

## 2014-01-22 DIAGNOSIS — Z3490 Encounter for supervision of normal pregnancy, unspecified, unspecified trimester: Secondary | ICD-10-CM

## 2014-01-22 NOTE — Progress Notes (Signed)
Doing well; No questions or concerns.

## 2014-01-23 ENCOUNTER — Inpatient Hospital Stay (HOSPITAL_COMMUNITY)
Admission: AD | Admit: 2014-01-23 | Discharge: 2014-01-23 | Disposition: A | Discharge status: Home / Self Care | Payer: 59 | Source: Ambulatory Visit | Attending: Obstetrics & Gynecology | Admitting: Obstetrics & Gynecology

## 2014-01-23 ENCOUNTER — Encounter (HOSPITAL_COMMUNITY): Payer: Self-pay

## 2014-01-23 DIAGNOSIS — Z3A34 34 weeks gestation of pregnancy: Secondary | ICD-10-CM | POA: Insufficient documentation

## 2014-01-23 DIAGNOSIS — O9989 Other specified diseases and conditions complicating pregnancy, childbirth and the puerperium: Secondary | ICD-10-CM | POA: Insufficient documentation

## 2014-01-23 DIAGNOSIS — R102 Pelvic and perineal pain: Secondary | ICD-10-CM

## 2014-01-23 DIAGNOSIS — N949 Unspecified condition associated with female genital organs and menstrual cycle: Secondary | ICD-10-CM | POA: Insufficient documentation

## 2014-01-23 DIAGNOSIS — O26893 Other specified pregnancy related conditions, third trimester: Secondary | ICD-10-CM

## 2014-01-23 LAB — URINALYSIS, ROUTINE W REFLEX MICROSCOPIC
Bilirubin Urine: NEGATIVE
Glucose, UA: NEGATIVE mg/dL
HGB URINE DIPSTICK: NEGATIVE
Ketones, ur: NEGATIVE mg/dL
Leukocytes, UA: NEGATIVE
NITRITE: NEGATIVE
Protein, ur: NEGATIVE mg/dL
SPECIFIC GRAVITY, URINE: 1.025 (ref 1.005–1.030)
Urobilinogen, UA: 0.2 mg/dL (ref 0.0–1.0)
pH: 6 (ref 5.0–8.0)

## 2014-01-23 NOTE — MAU Provider Note (Signed)
History     CSN: [id number]  Arrival date and time: 01/23/14 1106   None     Chief Complaint  Patient presents with  . Pelvic Pain   HPI   Pt is a 23 y.o. G1P0 [redacted]w[redacted]d who presents with increasing pelvic pain that has worsened over the last few days. Pt states she feels like "the baby is going to drop out of her vagina." Pt states it is more uncomfortable than anything else.   Pt also states she has had the same discharge throughout pregnancy, but would like for [place] to check and see if it is abnormal.    Fetal Movement is the same, Category I tracing.  FHR: 150  OB History    Gravida Para Term Preterm AB TAB SAB Ectopic Multiple Living   1         0      Past Medical History  Diagnosis Date  . Bipolar 2 disorder   . Pneumonia     age 32   . Bipolar disorder   . Depression   . Anxiety     Used Latuda (did not work)  . Yeast infection     No past surgical history on file.  Family History  Problem Relation Age of Onset  . Anesthesia problems Neg Hx   . Hypotension Neg Hx   . Malignant hyperthermia Neg Hx   . Pseudochol deficiency Neg Hx   . Depression Mother   . Gout Father     History  Substance Use Topics  . Smoking status: Never Smoker   . Smokeless tobacco: Never Used  . Alcohol Use: No    Allergies:  Allergies  Allergen Reactions  . Amoxicillin-Pot Clavulanate Other (See Comments)    Reaction childhood  . Latex     Possible yeast infection.    Prescriptions prior to admission  Medication Sig Dispense Refill Last Dose  . Iron-FA-B Cmp-C-Biot-Probiotic (FUSION PLUS) CAPS Take 1 capsule by mouth daily. 30 capsule 6 Past Week at Unknown time  . polyethylene glycol (MIRALAX / GLYCOLAX) packet Take 17 g by mouth daily. (Patient not taking: Reported on 01/23/2014) 14 each 0 Taking  . Prenatal Multivit-Min-Fe-FA (PRENATAL VITAMINS PO) Take by mouth.   Not Taking at Unknown time  . triamcinolone (KENALOG) 0.025 % ointment Apply 1 application topically 2  (two) times daily. (Patient not taking: Reported on 01/23/2014) 30 g 0 Taking    Review of Systems  Constitutional: Negative for fever and chills.  HENT:       From snoring   Eyes: Negative for blurred vision.  Respiratory: Positive for shortness of breath.   Cardiovascular: Negative for chest pain.  Gastrointestinal: Positive for nausea. Negative for vomiting.  Musculoskeletal: Positive for back pain and joint pain.  Neurological: Positive for headaches. Negative for dizziness.  Endo/Heme/Allergies: Negative.   Psychiatric/Behavioral:       Hx of Bipolar disorder    Physical Exam   Height 5' 2.5" (1.588 m), weight 102.967 kg (227 lb), last menstrual period 05/27/2013.  Physical Exam  Constitutional: She is oriented to person, place, and time. She appears well-developed and well-nourished.  HENT:  Head: Normocephalic and atraumatic.  Eyes: Conjunctivae and EOM are normal. Pupils are equal, round, and reactive to light.  Neck: Normal range of motion.  Cardiovascular: Normal rate and regular rhythm.   Respiratory: Effort normal.  Genitourinary: Vagina normal and uterus normal.  Normal appearing vaginal/cervical discharge.    Musculoskeletal: Normal range of motion.  Neurological: She is alert and oriented to person, place, and time.  Skin: Skin is warm and dry.    MAU Course  Procedures  MDM Cervical check, Fetal monitoring-NST  Assessment and Plan  Pt is a 23 y.o. G1P0 [redacted]w[redacted]d who presents with pelvic pain for the last several days increasing in severity.  Pt is likely having ligament stretching and normal pelvic pressure from pregnancy. Pt had no discernable discharge other than normal vaginal/cervical mucus.   Pt was reassured and given information on when to return to the MAU.  Pt stable for discharge home and to f/u as scheduled with her provider.    Remus Blake, MD 01/23/2014, 12:11 PM   I spoke with and examined patient and agree with resident/PA/SNM's  note and plan of care. Pt w/ pelvic pain 'for awhile' but has worsened in last few days. Vag d/c throughout pregnancy w/o odor/itching/irritation, so spec exam deferred. SVE: LTC by Dr. Jerilynn Mages. [place] Silos. Cat 1 FHR w/o uc's. Discussed ptl s/s, fkc, reasons to return. To keep next appt at [facility] as scheduled.   Roma Schanz, CNM, [facility] 01/23/2014 12:57 PM

## 2014-01-23 NOTE — MAU Note (Signed)
Pt states here for pelvic pain, is unable to describe how pain feels. No bleeding. Does have increased vaginal discharge. Pelvic pain noted "for awhile".

## 2014-01-23 NOTE — Discharge Instructions (Signed)
Third Trimester of Pregnancy The third trimester is from week 29 through week 42, months 7 through 9. The third trimester is a time when the fetus is growing rapidly. At the end of the ninth month, the fetus is about 20 inches in length and weighs 6-10 pounds.  BODY CHANGES Your body goes through many changes during pregnancy. The changes vary from woman to woman.   Your weight will continue to increase. You can expect to gain 25-35 pounds (11-16 kg) by the end of the pregnancy.  You may begin to get stretch marks on your hips, abdomen, and breasts.  You may urinate more often because the fetus is moving lower into your pelvis and pressing on your bladder.  You may develop or continue to have heartburn as a result of your pregnancy.  You may develop constipation because certain hormones are causing the muscles that push waste through your intestines to slow down.  You may develop hemorrhoids or swollen, bulging veins (varicose veins).  You may have pelvic pain because of the weight gain and pregnancy hormones relaxing your joints between the bones in your pelvis. Backaches may result from overexertion of the muscles supporting your posture.  You may have changes in your hair. These can include thickening of your hair, rapid growth, and changes in texture. Some women also have hair loss during or after pregnancy, or hair that feels dry or thin. Your hair will most likely return to normal after your baby is born.  Your breasts will continue to grow and be tender. A yellow discharge may leak from your breasts called colostrum.  Your belly button may stick out.  You may feel short of breath because of your expanding uterus.  You may notice the fetus "dropping," or moving lower in your abdomen.  You may have a bloody mucus discharge. This usually occurs a few days to a week before labor begins.  Your cervix becomes thin and soft (effaced) near your due date. WHAT TO EXPECT AT YOUR PRENATAL  EXAMS  You will have prenatal exams every 2 weeks until week 36. Then, you will have weekly prenatal exams. During a routine prenatal visit:  You will be weighed to make sure you and the fetus are growing normally.  Your blood pressure is taken.  Your abdomen will be measured to track your baby's growth.  The fetal heartbeat will be listened to.  Any test results from the previous visit will be discussed.  You may have a cervical check near your due date to see if you have effaced. At around 36 weeks, your caregiver will check your cervix. At the same time, your caregiver will also perform a test on the secretions of the vaginal tissue. This test is to determine if a type of bacteria, Group B streptococcus, is present. Your caregiver will explain this further. Your caregiver may ask you:  What your birth plan is.  How you are feeling.  If you are feeling the baby move.  If you have had any abnormal symptoms, such as leaking fluid, bleeding, severe headaches, or abdominal cramping.  If you have any questions. Other tests or screenings that may be performed during your third trimester include:  Blood tests that check for low iron levels (anemia).  Fetal testing to check the health, activity level, and growth of the fetus. Testing is done if you have certain medical conditions or if there are problems during the pregnancy. FALSE LABOR You may feel small, irregular contractions that   eventually go away. These are called Braxton Hicks contractions, or false labor. Contractions may last for hours, days, or even weeks before true labor sets in. If contractions come at regular intervals, intensify, or become painful, it is best to be seen by your caregiver.  SIGNS OF LABOR   Menstrual-like cramps.  Contractions that are 5 minutes apart or less.  Contractions that start on the top of the uterus and spread down to the lower abdomen and back.  A sense of increased pelvic pressure or back  pain.  A watery or bloody mucus discharge that comes from the vagina. If you have any of these signs before the 37th week of pregnancy, call your caregiver right away. You need to go to the hospital to get checked immediately. HOME CARE INSTRUCTIONS   Avoid all smoking, herbs, alcohol, and unprescribed drugs. These chemicals affect the formation and growth of the baby.  Follow your caregiver's instructions regarding medicine use. There are medicines that are either safe or unsafe to take during pregnancy.  Exercise only as directed by your caregiver. Experiencing uterine cramps is a good sign to stop exercising.  Continue to eat regular, healthy meals.  Wear a good support bra for breast tenderness.  Do not use hot tubs, steam rooms, or saunas.  Wear your seat belt at all times when driving.  Avoid raw meat, uncooked cheese, cat litter boxes, and soil used by cats. These carry germs that can cause birth defects in the baby.  Take your prenatal vitamins.  Try taking a stool softener (if your caregiver approves) if you develop constipation. Eat more high-fiber foods, such as fresh vegetables or fruit and whole grains. Drink plenty of fluids to keep your urine clear or pale yellow.  Take warm sitz baths to soothe any pain or discomfort caused by hemorrhoids. Use hemorrhoid cream if your caregiver approves.  If you develop varicose veins, wear support hose. Elevate your feet for 15 minutes, 3-4 times a day. Limit salt in your diet.  Avoid heavy lifting, wear low heal shoes, and practice good posture.  Rest a lot with your legs elevated if you have leg cramps or low back pain.  Visit your dentist if you have not gone during your pregnancy. Use a soft toothbrush to brush your teeth and be gentle when you floss.  A sexual relationship may be continued unless your caregiver directs you otherwise.  Do not travel far distances unless it is absolutely necessary and only with the approval  of your caregiver.  Take prenatal classes to understand, practice, and ask questions about the labor and delivery.  Make a trial run to the hospital.  Pack your hospital bag.  Prepare the baby's nursery.  Continue to go to all your prenatal visits as directed by your caregiver. SEEK MEDICAL CARE IF:  You are unsure if you are in labor or if your water has broken.  You have dizziness.  You have mild pelvic cramps, pelvic pressure, or nagging pain in your abdominal area.  You have persistent nausea, vomiting, or diarrhea.  You have a bad smelling vaginal discharge.  You have pain with urination. SEEK IMMEDIATE MEDICAL CARE IF:   You have a fever.  You are leaking fluid from your vagina.  You have spotting or bleeding from your vagina.  You have severe abdominal cramping or pain.  You have rapid weight loss or gain.  You have shortness of breath with chest pain.  You notice sudden or extreme swelling   of your face, hands, ankles, feet, or legs.  You have not felt your baby move in over an hour.  You have severe headaches that do not go away with medicine.  You have vision changes. Document Released: 01/16/2001 Document Revised: 01/27/2013 Document Reviewed: 03/25/2012 [employer] Patient Information 2015 [employer]. This information is not intended to replace advice given to you by your health care provider. Make sure you discuss any questions you have with your health care provider.  

## 2014-02-03 ENCOUNTER — Other Ambulatory Visit: Payer: Self-pay | Admitting: Obstetrics & Gynecology

## 2014-02-03 ENCOUNTER — Ambulatory Visit (INDEPENDENT_AMBULATORY_CARE_PROVIDER_SITE_OTHER): Payer: 59 | Admitting: Obstetrics & Gynecology

## 2014-02-03 ENCOUNTER — Encounter: Payer: Self-pay | Admitting: Obstetrics & Gynecology

## 2014-02-03 VITALS — BP 127/78 | HR 99 | Wt 231.0 lb

## 2014-02-03 DIAGNOSIS — Z3493 Encounter for supervision of normal pregnancy, unspecified, third trimester: Secondary | ICD-10-CM

## 2014-02-03 DIAGNOSIS — Z36 Encounter for antenatal screening of mother: Secondary | ICD-10-CM

## 2014-02-03 LAB — OB RESULTS CONSOLE GC/CHLAMYDIA
CHLAMYDIA, DNA PROBE: NEGATIVE
Gonorrhea: NEGATIVE

## 2014-02-03 LAB — OB RESULTS CONSOLE GBS: GBS: NEGATIVE

## 2014-02-03 NOTE — Progress Notes (Signed)
Routine visit. Good FM. No problems. Cervical cultures sent today. Labor precautions reveiwed.

## 2014-02-04 LAB — GC/CHLAMYDIA PROBE AMP
CT Probe RNA: NEGATIVE
GC Probe RNA: NEGATIVE

## 2014-02-06 LAB — CULTURE, BETA STREP (GROUP B ONLY)

## 2014-02-12 ENCOUNTER — Ambulatory Visit (INDEPENDENT_AMBULATORY_CARE_PROVIDER_SITE_OTHER): Payer: Medicaid Other | Admitting: Advanced Practice Midwife

## 2014-02-12 ENCOUNTER — Encounter: Payer: Self-pay | Admitting: Advanced Practice Midwife

## 2014-02-12 VITALS — BP 124/73 | HR 94 | Wt 231.0 lb

## 2014-02-12 DIAGNOSIS — Z3493 Encounter for supervision of normal pregnancy, unspecified, third trimester: Secondary | ICD-10-CM

## 2014-02-12 NOTE — Patient Instructions (Signed)

## 2014-02-12 NOTE — Progress Notes (Signed)
Doing well, but anxious about labor. Reviewed signs of labor. Informed GBS negative. 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  >>>>  After visit, while completing chart, I was unable to find any documents about waterbirth class or consent.Message sent for RN to call patient Monday to confirm and get these on chart. Needs consent and class certificate on chart.

## 2014-02-16 ENCOUNTER — Encounter: Payer: Self-pay | Admitting: *Deleted

## 2014-02-18 ENCOUNTER — Encounter: Payer: Self-pay | Admitting: *Deleted

## 2014-02-19 ENCOUNTER — Encounter: Payer: Self-pay | Admitting: *Deleted

## 2014-02-19 ENCOUNTER — Telehealth (HOSPITAL_COMMUNITY): Payer: Self-pay | Admitting: *Deleted

## 2014-02-19 ENCOUNTER — Ambulatory Visit (INDEPENDENT_AMBULATORY_CARE_PROVIDER_SITE_OTHER): Payer: 59 | Admitting: Family

## 2014-02-19 ENCOUNTER — Encounter (HOSPITAL_COMMUNITY): Payer: Self-pay | Admitting: *Deleted

## 2014-02-19 VITALS — BP 126/70 | HR 90 | Wt 228.0 lb

## 2014-02-19 DIAGNOSIS — Z3493 Encounter for supervision of normal pregnancy, unspecified, third trimester: Secondary | ICD-10-CM

## 2014-02-19 DIAGNOSIS — O321XX1 Maternal care for breech presentation, fetus 1: Secondary | ICD-10-CM

## 2014-02-19 NOTE — Telephone Encounter (Signed)
Preadmission screen  

## 2014-02-19 NOTE — Progress Notes (Signed)
Doing well, no questions or concerns.  Felt breech by leopold, bedside ultrasound confirmed breech presentation.  Dr. Nehemiah Settle called > pt will be scheduled for version on Monday 02/22/14.  Explained to patient to be NPO beginning at midnight on Sunday.  Explained if labor is induced that day that if pitocin is used or continuous monitoring required, waterbirth will not be possible.  Pt verbalized understanding.

## 2014-02-22 ENCOUNTER — Observation Stay (HOSPITAL_COMMUNITY)
Admission: RE | Admit: 2014-02-22 | Discharge: 2014-02-22 | Disposition: A | Discharge status: Home / Self Care | Payer: 59 | Source: Ambulatory Visit | Attending: Family Medicine | Admitting: Family Medicine

## 2014-02-22 VITALS — BP 122/62 | HR 85 | Temp 98.0°F | Resp 18 | Ht 62.5 in | Wt 228.0 lb

## 2014-02-22 DIAGNOSIS — Z3A38 38 weeks gestation of pregnancy: Secondary | ICD-10-CM | POA: Diagnosis not present

## 2014-02-22 DIAGNOSIS — Z62810 Personal history of physical and sexual abuse in childhood: Secondary | ICD-10-CM | POA: Diagnosis not present

## 2014-02-22 DIAGNOSIS — O99343 Other mental disorders complicating pregnancy, third trimester: Secondary | ICD-10-CM | POA: Diagnosis not present

## 2014-02-22 DIAGNOSIS — F319 Bipolar disorder, unspecified: Secondary | ICD-10-CM | POA: Insufficient documentation

## 2014-02-22 DIAGNOSIS — Z3493 Encounter for supervision of normal pregnancy, unspecified, third trimester: Secondary | ICD-10-CM

## 2014-02-22 DIAGNOSIS — IMO0002 Reserved for concepts with insufficient information to code with codable children: Secondary | ICD-10-CM | POA: Diagnosis present

## 2014-02-22 DIAGNOSIS — O3413 Maternal care for benign tumor of corpus uteri, third trimester: Secondary | ICD-10-CM | POA: Insufficient documentation

## 2014-02-22 DIAGNOSIS — F419 Anxiety disorder, unspecified: Secondary | ICD-10-CM | POA: Insufficient documentation

## 2014-02-22 DIAGNOSIS — O321XX0 Maternal care for breech presentation, not applicable or unspecified: Principal | ICD-10-CM | POA: Insufficient documentation

## 2014-02-22 DIAGNOSIS — D259 Leiomyoma of uterus, unspecified: Secondary | ICD-10-CM | POA: Diagnosis not present

## 2014-02-22 MED ORDER — TERBUTALINE SULFATE 1 MG/ML IJ SOLN: 0.2500 mg | Freq: Once | INTRAMUSCULAR | Status: DC

## 2014-02-22 MED ORDER — LACTATED RINGERS IV SOLN: INTRAVENOUS | Status: DC

## 2014-02-22 MED ORDER — TERBUTALINE SULFATE 1 MG/ML IJ SOLN
INTRAMUSCULAR | Status: AC
  Administered 2014-02-22: 1 mg
  Filled 2014-02-22: qty 1

## 2014-02-22 NOTE — Procedures (Signed)
After informed verbal consent, Terbutaline 0.25 mg SQ given, ECV was attempted under Ultrasound guidance.  Baby turned in clockwise fashion after third attempt.   FHR was reactive before and after the procedure.   Pt. Tolerated the procedure well.
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
## 2014-02-22 NOTE — Discharge Instructions (Signed)
External Cephalic Version External cephalic version is turning a baby that is presenting his or her buttocks first (breech) or is lying sideways in the uterus (transverse) to a head-first position. This makes the labor and delivery faster, safer for the mother and baby, and lessens the chance for a cesarean section. It should not be tried until the pregnancy is [redacted] weeks along or longer. BEFORE THE PROCEDURE   Do not take aspirin.  Do not eat for 4 hours before the procedure.  Tell your caregiver if you have a cold, fever, or an infection.  Tell your caregiver if you are having contractions.  Tell your caregiver if you are leaking or had a gush of fluid from your vagina.  Tell your caregiver if you have any vaginal bleeding or abnormal discharge.  If you are being admitted the same day, arrive at the hospital at least one hour before the procedure to sign any necessary documents and to get prepared for the procedure.  Tell your caregiver if you had any problems with anesthetics in the past.  Tell your caregiver if you are taking any medications that your caregiver does not know about. This includes over-the-counter and prescription drugs, herbs, eye drops and creams. PROCEDURE  First, an ultrasound is done to make sure the baby is breech or transverse.  A non-stress test or biophysical profile is done on the baby before the ECV. This is done to make sure it is safe for the baby to have the ECV. It may also be done after the procedure to make sure the baby is okay.  ECV is done in the delivery/surgical room with an anesthesiologist present. There should be a setup for an emergency cesarean section with a full nursing and nursery staff available and ready.  The patient may be given a medication to relax the uterine muscles. An epidural may be given for any discomfort. It is helpful for the success of the ECV.  An electronic fetal monitor is placed on the uterus during the procedure to  make sure the baby is okay.  If the mother is Rh-negative, Rho (D) immune globulin will be given to her to prevent Rh problems for future pregnancies.  The mother is followed closely for 2 to 3 hours after the procedure to make sure no problems develop. BENEFITS OF ECV  Easier and safer labor and delivery for the mother and baby.  Lower incidence of cesarean section.  Lower costs with a vaginal delivery. RISKS OF ECV  The placenta pulls away from the wall of the uterus before delivery (abruption of the placenta).  Rupture of the uterus, especially in patients with a previous cesarean section.  Fetal distress.  Early (premature) labor.  Premature rupture of the membranes.  The baby will return to the breech or transverse lie position.  Death of the fetus can happen but is very rare. ECV SHOULD BE STOPPED IF:  The fetal heart tones drop.  The mother is having a lot of pain.  You cannot turn the baby after several attempts. ECV SHOULD NOT BE DONE IF:  The non-stress test or biophysical profile is abnormal.  There is vaginal bleeding.  An abnormal shaped uterus is present.  There is heart disease or uncontrolled high blood pressure in the mother.  There are twins or more.  The placenta covers the opening of the cervix (placenta previa).  You had a previous cesarean section with a classical incision or major surgery of the uterus.  There  is not enough amniotic fluid in the sac (oligohydramnios).  The baby is too small for the pregnancy or has not developed normally (anomaly).  Your membranes have ruptured. HOME CARE INSTRUCTIONS   Have someone take you home after the procedure.  Rest at home for several hours.  Have someone stay with you for a few hours after you get home.  After ECV, continue with your prenatal visits as directed.  Continue your regular diet, rest and activities.  Do not do any strenuous activities for a couple of days. SEEK IMMEDIATE  MEDICAL CARE IF:   You develop vaginal bleeding.  You have fluid coming out of your vagina (bag of water may have broken).  You develop uterine contractions.  You do not feel the baby move or there is less movement of the baby.  You develop abdominal pain.  You develop an oral temperature of 102 F (38.9 C) or higher. Document Released: 07/17/2006 Document Revised: 06/08/2013 Document Reviewed: 05/12/2008 [facility] Patient Information 2015 [place], [place]. This information is not intended to replace advice given to you by your health care provider. Make sure you discuss any questions you have with your health care provider.

## 2014-02-22 NOTE — Discharge Summary (Signed)
Physician Discharge Summary  Patient ID: Tasha Barrett MRN: [id number] DOB/AGE: [birth year] 24 y.o.  The above patient was seen on the Birthing suits for an outpatient procedure: external cephalic version, which was successful.  The baby was monitored for 1 hour afterwards, showing reactive FHT with accelerations and moderate variability.  Patient reported no bleeding or spotting or leaking fluid.  Patient will follow up in the clinic for continued prenatal care.  Loma Boston JEHIEL 02/22/2014 9:44 AM

## 2014-02-23 ENCOUNTER — Ambulatory Visit (INDEPENDENT_AMBULATORY_CARE_PROVIDER_SITE_OTHER): Payer: 59 | Admitting: Obstetrics & Gynecology

## 2014-02-23 VITALS — BP 121/68 | HR 90 | Wt 228.0 lb

## 2014-02-23 DIAGNOSIS — Z3493 Encounter for supervision of normal pregnancy, unspecified, third trimester: Secondary | ICD-10-CM

## 2014-02-23 NOTE — Progress Notes (Signed)
Had a version yesterday and feels like it is transverse today

## 2014-02-23 NOTE — Progress Notes (Signed)
Routine visit. Good FM. Labor precautions reviewed. Bedside u/s shows vertex lie.

## 2014-02-26 ENCOUNTER — Encounter: Payer: 59 | Admitting: Family

## 2014-02-28 ENCOUNTER — Encounter (HOSPITAL_COMMUNITY): Payer: Self-pay | Admitting: *Deleted

## 2014-02-28 ENCOUNTER — Inpatient Hospital Stay (HOSPITAL_COMMUNITY)
Admission: AD | Admit: 2014-02-28 | Discharge: 2014-02-28 | Disposition: A | Discharge status: Home / Self Care | Payer: 59 | Source: Ambulatory Visit | Attending: Obstetrics and Gynecology | Admitting: Obstetrics and Gynecology

## 2014-02-28 ENCOUNTER — Inpatient Hospital Stay (HOSPITAL_COMMUNITY): Payer: 59

## 2014-02-28 DIAGNOSIS — O99891 Other specified diseases and conditions complicating pregnancy: Secondary | ICD-10-CM

## 2014-02-28 DIAGNOSIS — R109 Unspecified abdominal pain: Secondary | ICD-10-CM | POA: Diagnosis present

## 2014-02-28 DIAGNOSIS — Z3A39 39 weeks gestation of pregnancy: Secondary | ICD-10-CM | POA: Diagnosis not present

## 2014-02-28 DIAGNOSIS — O9989 Other specified diseases and conditions complicating pregnancy, childbirth and the puerperium: Secondary | ICD-10-CM | POA: Diagnosis not present

## 2014-02-28 DIAGNOSIS — M549 Dorsalgia, unspecified: Secondary | ICD-10-CM

## 2014-02-28 LAB — URINALYSIS, ROUTINE W REFLEX MICROSCOPIC
Bilirubin Urine: NEGATIVE
Glucose, UA: NEGATIVE mg/dL
HGB URINE DIPSTICK: NEGATIVE
KETONES UR: 15 mg/dL — AB
LEUKOCYTES UA: NEGATIVE
Nitrite: NEGATIVE
Protein, ur: NEGATIVE mg/dL
Urobilinogen, UA: 0.2 mg/dL (ref 0.0–1.0)
pH: 6.5 (ref 5.0–8.0)

## 2014-02-28 MED ORDER — CYCLOBENZAPRINE HCL 5 MG PO TABS: 5.0000 mg | ORAL_TABLET | Freq: Three times a day (TID) | ORAL | Status: DC | PRN

## 2014-02-28 NOTE — MAU Note (Addendum)
Patients states being awoken in the middle of the night the last 3 nights with left sided abdominal pain and states that she thinks it may be "gas pain."  She states that she had a version done last week and wonders if it may be related. She called the advice RN and they told her to come to the hospital.

## 2014-03-03 ENCOUNTER — Ambulatory Visit (INDEPENDENT_AMBULATORY_CARE_PROVIDER_SITE_OTHER): Payer: 59 | Admitting: Obstetrics & Gynecology

## 2014-03-03 ENCOUNTER — Ambulatory Visit (HOSPITAL_COMMUNITY)
Admission: RE | Admit: 2014-03-03 | Discharge: 2014-03-03 | Disposition: A | Discharge status: Home / Self Care | Payer: 59 | Source: Ambulatory Visit | Attending: Obstetrics & Gynecology | Admitting: Obstetrics & Gynecology

## 2014-03-03 VITALS — BP 123/56 | HR 76 | Wt 227.0 lb

## 2014-03-03 DIAGNOSIS — O289 Unspecified abnormal findings on antenatal screening of mother: Secondary | ICD-10-CM | POA: Insufficient documentation

## 2014-03-03 DIAGNOSIS — Z36 Encounter for antenatal screening of mother: Secondary | ICD-10-CM | POA: Diagnosis present

## 2014-03-03 DIAGNOSIS — D259 Leiomyoma of uterus, unspecified: Secondary | ICD-10-CM | POA: Insufficient documentation

## 2014-03-03 DIAGNOSIS — O3413 Maternal care for benign tumor of corpus uteri, third trimester: Secondary | ICD-10-CM | POA: Insufficient documentation

## 2014-03-03 DIAGNOSIS — O288 Other abnormal findings on antenatal screening of mother: Secondary | ICD-10-CM | POA: Insufficient documentation

## 2014-03-03 DIAGNOSIS — Z3493 Encounter for supervision of normal pregnancy, unspecified, third trimester: Secondary | ICD-10-CM

## 2014-03-03 DIAGNOSIS — Z3A40 40 weeks gestation of pregnancy: Secondary | ICD-10-CM | POA: Insufficient documentation

## 2014-03-03 DIAGNOSIS — O480 Post-term pregnancy: Secondary | ICD-10-CM

## 2014-03-08 ENCOUNTER — Encounter (HOSPITAL_COMMUNITY): Payer: Self-pay | Admitting: *Deleted

## 2014-03-08 ENCOUNTER — Encounter: Payer: 59 | Admitting: Obstetrics & Gynecology

## 2014-03-08 ENCOUNTER — Inpatient Hospital Stay (HOSPITAL_COMMUNITY)
Admission: AD | Admit: 2014-03-08 | Discharge: 2014-03-11 | DRG: 765 | Disposition: A | Discharge status: Home / Self Care | Payer: 59 | Source: Ambulatory Visit | Attending: Family Medicine | Admitting: Family Medicine

## 2014-03-08 DIAGNOSIS — Z3403 Encounter for supervision of normal first pregnancy, third trimester: Secondary | ICD-10-CM | POA: Diagnosis present

## 2014-03-08 DIAGNOSIS — O411230 Chorioamnionitis, third trimester, not applicable or unspecified: Secondary | ICD-10-CM | POA: Diagnosis present

## 2014-03-08 DIAGNOSIS — Z880 Allergy status to penicillin: Secondary | ICD-10-CM

## 2014-03-08 DIAGNOSIS — O480 Post-term pregnancy: Secondary | ICD-10-CM | POA: Diagnosis present

## 2014-03-08 DIAGNOSIS — Z3A40 40 weeks gestation of pregnancy: Secondary | ICD-10-CM | POA: Diagnosis present

## 2014-03-08 LAB — TYPE AND SCREEN
ABO/RH(D): O POS
Antibody Screen: NEGATIVE

## 2014-03-08 LAB — POCT FERN TEST

## 2014-03-08 LAB — CBC
HCT: 30.1 % — ABNORMAL LOW (ref 36.0–46.0)
Hemoglobin: 9.5 g/dL — ABNORMAL LOW (ref 12.0–15.0)
MCH: 24 pg — ABNORMAL LOW (ref 26.0–34.0)
MCHC: 31.6 g/dL (ref 30.0–36.0)
MCV: 76 fL — ABNORMAL LOW (ref 78.0–100.0)
Platelets: 174 10*3/uL (ref 150–400)
RBC: 3.96 MIL/uL (ref 3.87–5.11)
RDW: 16.9 % — ABNORMAL HIGH (ref 11.5–15.5)
WBC: 7.3 10*3/uL (ref 4.0–10.5)

## 2014-03-08 LAB — ABO/RH: ABO/RH(D): O POS

## 2014-03-08 MED ORDER — LACTATED RINGERS IV SOLN
500.0000 mL | INTRAVENOUS | Status: DC | PRN
  Administered 2014-03-09: 500 mL via INTRAVENOUS

## 2014-03-08 MED ORDER — OXYCODONE-ACETAMINOPHEN 5-325 MG PO TABS: 2.0000 | ORAL_TABLET | ORAL | Status: DC | PRN

## 2014-03-08 MED ORDER — TERBUTALINE SULFATE 1 MG/ML IJ SOLN: 0.2500 mg | Freq: Once | INTRAMUSCULAR | Status: AC | PRN

## 2014-03-08 MED ORDER — LACTATED RINGERS IV SOLN
INTRAVENOUS | Status: DC
  Administered 2014-03-09 (×3): 125 mL/h via INTRAVENOUS

## 2014-03-08 MED ORDER — ACETAMINOPHEN 325 MG PO TABS: 650.0000 mg | ORAL_TABLET | ORAL | Status: DC | PRN

## 2014-03-08 MED ORDER — FENTANYL CITRATE 0.05 MG/ML IJ SOLN
50.0000 ug | INTRAMUSCULAR | Status: DC | PRN
  Administered 2014-03-09: 100 ug via INTRAVENOUS
  Filled 2014-03-08: qty 2

## 2014-03-08 MED ORDER — LIDOCAINE HCL (PF) 1 % IJ SOLN: 30.0000 mL | INTRAMUSCULAR | Status: DC | PRN

## 2014-03-08 MED ORDER — OXYTOCIN 40 UNITS IN LACTATED RINGERS INFUSION - SIMPLE MED
1.0000 m[IU]/min | INTRAVENOUS | Status: DC
  Administered 2014-03-09: 8 m[IU]/min via INTRAVENOUS
  Administered 2014-03-09: 4 m[IU]/min via INTRAVENOUS
  Administered 2014-03-09: 2 m[IU]/min via INTRAVENOUS
  Administered 2014-03-09: 6 m[IU]/min via INTRAVENOUS
  Filled 2014-03-08: qty 1000

## 2014-03-08 MED ORDER — OXYTOCIN BOLUS FROM INFUSION: 500.0000 mL | INTRAVENOUS | Status: DC

## 2014-03-08 MED ORDER — CITRIC ACID-SODIUM CITRATE 334-500 MG/5ML PO SOLN
30.0000 mL | ORAL | Status: DC | PRN
  Administered 2014-03-09: 30 mL via ORAL
  Filled 2014-03-08: qty 15

## 2014-03-08 MED ORDER — OXYTOCIN 40 UNITS IN LACTATED RINGERS INFUSION - SIMPLE MED: 62.5000 mL/h | INTRAVENOUS | Status: DC

## 2014-03-08 MED ORDER — OXYCODONE-ACETAMINOPHEN 5-325 MG PO TABS: 1.0000 | ORAL_TABLET | ORAL | Status: DC | PRN

## 2014-03-08 MED ORDER — ONDANSETRON HCL 4 MG/2ML IJ SOLN: 4.0000 mg | Freq: Four times a day (QID) | INTRAMUSCULAR | Status: DC | PRN

## 2014-03-08 NOTE — Progress Notes (Signed)
LABOR PROGRESS NOTE  Karlisha Mathena is a 24 y.o. G1P0 at [redacted]w[redacted]d admitted for rupture of membranes without active labor.   Subjective: Contractions are becoming more regular and painful.   Objective: BP 124/68 mmHg  Pulse 82  Temp(Src) 97.8 F (36.6 C) (Oral)  Resp 20  Ht 5' 2.5" (1.588 m)  Wt 227 lb (102.967 kg)  BMI 40.83 kg/m2  LMP 05/27/2013 or  Filed Vitals:   03/08/14 0850 03/08/14 1009 03/08/14 1227 03/08/14 1408  BP: 118/64  111/42 124/68  Pulse: 72  72 82  Temp: 98.2 F (36.8 C)  98.1 F (36.7 C) 97.8 F (36.6 C)  TempSrc: Oral  Oral Oral  Resp: 20  20 20   Height:  5' 2.5" (1.588 m)    Weight:  227 lb (102.967 kg)         FHT:  FHR: 150 bpm, variability: moderate,  accelerations:  Present,  decelerations:  Absent UC:   irregular, every 4-6 minutes SVE:   Dilation: 1 Effacement (%): 50 Exam by:: Dr Deniece Ree  Dilation: 1 Effacement (%): 50 Exam by:: Dr Deniece Ree  Labs: Lab Results  Component Value Date   WBC 7.3 03/08/2014   HGB 9.5* 03/08/2014   HCT 30.1* 03/08/2014   MCV 76.0* 03/08/2014   PLT 174 03/08/2014    Assessment / Plan: Induction of labor secondary to PROM without onset of labor.   Labor: Early in induction. Foley bulb remains in place. Now starting to contract more regularly although still early in labor course. Plan to start pitocin once foley bulb out if no regularly contractions.  Fetal Wellbeing:  Category I Pain Control:  Labor support without medications Anticipated MOD:  NSVD  Shelbie Hutching, MD 03/08/2014, 4:51 PM

## 2014-03-08 NOTE — H&P (Signed)
Chief Complaint:  Water broke   HPI: Tasha Barrett is a 24 y.o. G1P0 at [redacted]w[redacted]d who presents to maternity admissions reporting SROM @ 0715 this morning. Patient states she felt a "pop" this morning, she got up and felt a significant amount of nonbloody fluid expelled from her vagina. Nursing staff in the MAU noted some light meconium staining to the fluid. Fern test positive. Since that time she has had an increase in UC frequency. She states these are getting more uncomfortable but since "not bad". Denies vaginal bleeding. Endorses good prenatal care w/o complications. Good fetal movement.   Patient reports significant anxiety when dealing w/ new providers. She states she knows Dr. Deniece Ree well and has asked that she provide most of her care while she is available. Also, patient has gone through the necessary actions for a water birth -- this will be discussed w/ Dr. Deniece Ree.  Pregnancy Course:  Clinic    Dating LMP, consistent with 9 wk ultrasound  Genetic Screen 1 Screen:  nml          AFP:   neg             Anatomic US Wnl with 2 small uterine fibroids (3x3)  GTT Third trimester: 132 at 24 weeks 28 wks 37  TDaP vaccine 12/11/13  Flu vaccine 10/16  GBS   Contraception  States "none"  Baby Food  Breast  Circumcision  n/a  Pediatrician  Undecided  Support Person  Christian - FOB    Past Medical History: Past Medical History  Diagnosis Date  . Bipolar 2 disorder   . Pneumonia     age 34   . Bipolar disorder   . Depression   . Anxiety     Used Latuda (did not work)  . Yeast infection     Past obstetric history: OB History  Gravida Para Term Preterm AB SAB TAB Ectopic Multiple Living  1         0    # Outcome Date GA Lbr Len/2nd Weight Sex Delivery Anes PTL Lv  1 Current               Past Surgical History: History reviewed. No pertinent past surgical history.   Family History: Family History  Problem Relation Age of Onset  . Anesthesia problems Neg Hx   .  Hypotension Neg Hx   . Malignant hyperthermia Neg Hx   . Pseudochol deficiency Neg Hx   . Depression Mother   . Gout Father   . Gout Paternal Uncle   . Gout Paternal Grandfather     Social History: History  Substance Use Topics  . Smoking status: Former Smoker    Types: Cigarettes  . Smokeless tobacco: Never Used  . Alcohol Use: No    Allergies:  Allergies  Allergen Reactions  . Amoxicillin-Pot Clavulanate Other (See Comments)    Reaction childhood  . Latex     Possible yeast infection.    Meds:  Prescriptions prior to admission  Medication Sig Dispense Refill Last Dose  . cyclobenzaprine (FLEXERIL) 5 MG tablet Take 1 tablet (5 mg total) by mouth 3 (three) times daily as needed for muscle spasms. 30 tablet 0 Taking  . Iron-FA-B Cmp-C-Biot-Probiotic (FUSION PLUS) CAPS Take 1 capsule by mouth daily. 30 capsule 6 Taking  . polyethylene glycol (MIRALAX / GLYCOLAX) packet Take 17 g by mouth daily. 14 each 0 Taking  . Prenatal Multivit-Min-Fe-FA (PRENATAL VITAMINS PO) Take by mouth.   Taking  .  triamcinolone (KENALOG) 0.025 % ointment Apply 1 application topically 2 (two) times daily. 30 g 0 Taking    ROS: Pertinent findings in history of present illness.  Physical Exam  Last menstrual period 05/27/2013. GENERAL: Well-developed, well-nourished female in no acute distress. Anxious HEENT: normocephalic HEART: normal rate RESP: normal effort ABDOMEN: Soft, non-tender, gravid appropriate for gestational age EXTREMITIES: Nontender, no edema NEURO: alert and oriented    FHT:  Baseline 140 , moderate variability, accelerations present, no decelerations Contractions: irreg   Labs: Results for orders placed or performed during the hospital encounter of 03/08/14 (from the past 24 hour(s))  Fern Test     Status: Abnormal   Collection Time: 03/08/14  8:51 AM  Result Value Ref Range   POCT [place] Test      Imaging:  US Ob Limited  02/28/2014   OBSTETRICAL ULTRASOUND: This  exam was performed within a [place] [facility]. The OB US report was generated in the AS system, and faxed to the ordering physician.   This report is available in the [facility]. See the AS Obstetric US report via the Image Link.  US Fetal Bpp W/o Non Stress  03/03/2014   OBSTETRICAL ULTRASOUND: This exam was performed within a [facility]. The OB US report was generated in the AS system, and faxed to the ordering physician.   This report is available in the [facility]. See the AS Obstetric US report via the Image Link.   Assessment: 1. Labor: SROM, latent phase 2. Fetal Wellbeing: Category 1  3. Pain Control: none. Will place Fentanyl as PRN 4. GBS: neg 5. 40.5 week IUP  Plan:  1. Admit to BS per consult with MD 2. Routine L&D orders 3. Analgesia/anesthesia PRN       Medication List    ASK your doctor about these medications        cyclobenzaprine 5 MG tablet  Commonly known as:  FLEXERIL  Take 1 tablet (5 mg total) by mouth 3 (three) times daily as needed for muscle spasms.     FUSION PLUS Caps  Take 1 capsule by mouth daily.     polyethylene glycol packet  Commonly known as:  MIRALAX / GLYCOLAX  Take 17 g by mouth daily.     PRENATAL VITAMINS PO  Take by mouth.     triamcinolone 0.025 % ointment  Commonly known as:  KENALOG  Apply 1 application topically 2 (two) times daily.        Elberta Leatherwood, MD 03/08/2014 9:57 AM   OB fellow attestation:  I have seen and examined this patient; I agree with above documentation in the resident's note.   Tasha Barrett is a 24 y.o. G1P0 admitted to L&D after SROM  PE: BP 118/64 mmHg  Pulse 72  Temp(Src) 98.2 F (36.8 C) (Oral)  Resp 20  Ht 5' 2.5" (1.588 m)  Wt 227 lb (102.967 kg)  BMI 40.83 kg/m2  LMP 05/27/2013 Gen: calm comfortable, NAD Resp: normal effort, no distress Abd: gravid  ROS, labs, PMH reviewed  Plan: MOF: breast MOC: undecided ID: GBS neg FWB: cat II  earlier 2/2 minimal variability, now cat I Labor: FB placed at 1200, cytotec at 1600 if not out  Would like water birth, in light of moderate meconium, decreased variability and likelihood of needing pitocin likely to risk out of water birth, discussed at length with patient.  Pain: declines epidural or pain medicine at this time  Tasha Barrett  03/08/2014, 11:16 AM

## 2014-03-08 NOTE — Progress Notes (Signed)
LABOR PROGRESS NOTE  Tasha Barrett is a 24 y.o. G1P0 at [redacted]w[redacted]d admitted for rupture of membranes now in early active labor.    Subjective: Contractions are becoming more regular and painful. Using labor ball.   Objective: BP 128/56 mmHg  Pulse 95  Temp(Src) 99.2 F (37.3 C) (Oral)  Resp 18  Ht 5' 2.5" (1.588 m)  Wt 227 lb (102.967 kg)  BMI 40.83 kg/m2  LMP 05/27/2013 or  Filed Vitals:   03/08/14 1726 03/08/14 1927 03/08/14 2007 03/08/14 2033  BP: 122/55 142/73 131/68 128/56  Pulse: 85 95 92 95  Temp: 99.1 F (37.3 C) 98.9 F (37.2 C) 99.2 F (37.3 C)   TempSrc: Oral Oral Oral   Resp: 20 20 18 18   Height:      Weight:           FHT:  FHR: 150 bpm, variability: moderate,  accelerations:  Present,  decelerations:  Absent UC:   Regular every 2-4 minutes SVE:   Dilation: 4.5 Effacement (%): 70 Station: -2 Exam by:: TWillis RNC  Dilation: 4.5 Effacement (%): 70 Station: -2 Presentation: Vertex Exam by:: TWillis RNC  Labs: Lab Results  Component Value Date   WBC 7.3 03/08/2014   HGB 9.5* 03/08/2014   HCT 30.1* 03/08/2014   MCV 76.0* 03/08/2014   PLT 174 03/08/2014    Assessment / Plan: Induction of labor secondary to PROM without onset of labor.   Labor: Progressing well. Foley bulb out and SVE 4-5/70/-2 per nursing. Regularly contracting and coping well. Plan for expectant management at this time. If no cervical change at next check will plan to place cytotec (patient preference) for further augmentation.  Fetal Wellbeing:  Category I Pain Control:  Labor support without medications. Doula on her way.  Anticipated MOD:  NSVD  Shelbie Hutching, MD 03/08/2014, 9:19 PM

## 2014-03-08 NOTE — MAU Note (Signed)
Water broke around 0700, green fluid. Planning a water birth.

## 2014-03-08 NOTE — Plan of Care (Signed)
Pt offered position changes, comfort measures; pt states she wants to nap, but unable to with uc's becoming stronger and more frequent; EFM applied for monitoring with pt's agreement; Pt states unable to walk due to discomfort; states unable to walk to bathroom by herself without husband's assistance.  Pt asks RN for assistance, then asks RN to wait, not related to pain, just "needs a moment:"..i.e. Changing gown, adjusting straps.  Foley bulb tugged with painful resistance, pt unable to tolerate position change due to discomfort.   Pt not responsive to therapeutic touch or encouragement.  Frequent readjustment of toco with difficulty tracing uc's due to position and general discomfort.  FOB at bedside, along with other family members.  Pt has been in contact with doula by phone.  Discussed plan of care with patient, encouraged continued movement as pt desires no augmentation.

## 2014-03-08 NOTE — Plan of Care (Signed)
Pt removed self from monitor; states she had asked for RN " a long time ago"- RN was not given message; Pt visibly upset; reassured that message was not received, offered to assist patient in whatever she needs, but that currently baby will need to go back on EFM due to nonreactivity.  Pt given a few minutes, will reapply monitor.

## 2014-03-08 NOTE — MAU Note (Signed)
Per [place], RN charge, pt to go to room 166 on birthing suites

## 2014-03-08 NOTE — Progress Notes (Signed)
Dr. Alease Frame notified pt grossly ruptured, mod mec, fern +, to go to birthing suites. Orders obtained.

## 2014-03-09 ENCOUNTER — Encounter (HOSPITAL_COMMUNITY): Payer: Self-pay | Admitting: *Deleted

## 2014-03-09 ENCOUNTER — Inpatient Hospital Stay (HOSPITAL_COMMUNITY): Payer: 59 | Admitting: Anesthesiology

## 2014-03-09 ENCOUNTER — Encounter (HOSPITAL_COMMUNITY)
Admission: AD | Disposition: A | Discharge status: Home / Self Care | Payer: Self-pay | Source: Ambulatory Visit | Attending: Family Medicine

## 2014-03-09 DIAGNOSIS — O324XX0 Maternal care for high head at term, not applicable or unspecified: Secondary | ICD-10-CM

## 2014-03-09 DIAGNOSIS — Z3A40 40 weeks gestation of pregnancy: Secondary | ICD-10-CM

## 2014-03-09 DIAGNOSIS — O4292 Full-term premature rupture of membranes, unspecified as to length of time between rupture and onset of labor: Secondary | ICD-10-CM

## 2014-03-09 DIAGNOSIS — O411230 Chorioamnionitis, third trimester, not applicable or unspecified: Secondary | ICD-10-CM

## 2014-03-09 LAB — RPR: RPR: NONREACTIVE

## 2014-03-09 SURGERY — Surgical Case
Anesthesia: Epidural

## 2014-03-09 MED ORDER — SODIUM CHLORIDE 0.9 % IJ SOLN: 3.0000 mL | INTRAMUSCULAR | Status: DC | PRN

## 2014-03-09 MED ORDER — KETOROLAC TROMETHAMINE 30 MG/ML IJ SOLN
INTRAMUSCULAR | Status: AC
Start: 2014-03-09 — End: 2014-03-09
  Administered 2014-03-09: 30 mg via INTRAMUSCULAR
  Filled 2014-03-09: qty 1

## 2014-03-09 MED ORDER — CHLOROPROCAINE HCL 3 % IJ SOLN
INTRAMUSCULAR | Status: AC
Start: 2014-03-09 — End: 2014-03-09
  Filled 2014-03-09: qty 20

## 2014-03-09 MED ORDER — DIPHENHYDRAMINE HCL 25 MG PO CAPS: 25.0000 mg | ORAL_CAPSULE | Freq: Four times a day (QID) | ORAL | Status: DC | PRN

## 2014-03-09 MED ORDER — ACETAMINOPHEN 650 MG RE SUPP
650.0000 mg | Freq: Three times a day (TID) | RECTAL | Status: DC | PRN
  Administered 2014-03-09: 650 mg via RECTAL
  Filled 2014-03-09 (×3): qty 1

## 2014-03-09 MED ORDER — SCOPOLAMINE 1 MG/3DAYS TD PT72
1.0000 | MEDICATED_PATCH | Freq: Once | TRANSDERMAL | Status: DC
  Filled 2014-03-09: qty 1

## 2014-03-09 MED ORDER — SIMETHICONE 80 MG PO CHEW: 80.0000 mg | CHEWABLE_TABLET | ORAL | Status: DC | PRN

## 2014-03-09 MED ORDER — LACTATED RINGERS IV SOLN
500.0000 mL | Freq: Once | INTRAVENOUS | Status: AC
  Administered 2014-03-09: 500 mL via INTRAVENOUS

## 2014-03-09 MED ORDER — OXYTOCIN 10 UNIT/ML IJ SOLN
40.0000 [IU] | INTRAVENOUS | Status: DC | PRN
  Administered 2014-03-09: 40 [IU] via INTRAVENOUS

## 2014-03-09 MED ORDER — BUPIVACAINE HCL (PF) 0.25 % IJ SOLN
INTRAMUSCULAR | Status: AC
  Filled 2014-03-09: qty 30

## 2014-03-09 MED ORDER — ONDANSETRON HCL 4 MG/2ML IJ SOLN: 4.0000 mg | Freq: Three times a day (TID) | INTRAMUSCULAR | Status: DC | PRN

## 2014-03-09 MED ORDER — OXYCODONE-ACETAMINOPHEN 5-325 MG PO TABS
2.0000 | ORAL_TABLET | ORAL | Status: DC | PRN
  Administered 2014-03-10 – 2014-03-11 (×5): 2 via ORAL
  Filled 2014-03-09 (×5): qty 2

## 2014-03-09 MED ORDER — ONDANSETRON HCL 4 MG PO TABS: 4.0000 mg | ORAL_TABLET | ORAL | Status: DC | PRN

## 2014-03-09 MED ORDER — LANOLIN HYDROUS EX OINT: 1.0000 "application " | TOPICAL_OINTMENT | CUTANEOUS | Status: DC | PRN

## 2014-03-09 MED ORDER — VANCOMYCIN HCL IN DEXTROSE 1-5 GM/200ML-% IV SOLN
1000.0000 mg | Freq: Two times a day (BID) | INTRAVENOUS | Status: AC
  Administered 2014-03-09 – 2014-03-10 (×2): 1000 mg via INTRAVENOUS
  Filled 2014-03-09 (×2): qty 200

## 2014-03-09 MED ORDER — OXYTOCIN 10 UNIT/ML IJ SOLN
INTRAMUSCULAR | Status: AC
  Filled 2014-03-09: qty 4

## 2014-03-09 MED ORDER — ZOLPIDEM TARTRATE 5 MG PO TABS
5.0000 mg | ORAL_TABLET | Freq: Every evening | ORAL | Status: DC | PRN
  Filled 2014-03-09: qty 1

## 2014-03-09 MED ORDER — PHENYLEPHRINE 8 MG IN D5W 100 ML (0.08MG/ML) PREMIX OPTIME
INJECTION | INTRAVENOUS | Status: AC
  Filled 2014-03-09: qty 100

## 2014-03-09 MED ORDER — OXYCODONE-ACETAMINOPHEN 5-325 MG PO TABS
1.0000 | ORAL_TABLET | ORAL | Status: DC | PRN
  Filled 2014-03-09 (×2): qty 1

## 2014-03-09 MED ORDER — DIPHENHYDRAMINE HCL 50 MG/ML IJ SOLN: 12.5000 mg | INTRAMUSCULAR | Status: DC | PRN

## 2014-03-09 MED ORDER — DIBUCAINE 1 % RE OINT: 1.0000 "application " | TOPICAL_OINTMENT | RECTAL | Status: DC | PRN

## 2014-03-09 MED ORDER — IBUPROFEN 600 MG PO TABS
600.0000 mg | ORAL_TABLET | Freq: Four times a day (QID) | ORAL | Status: DC
  Administered 2014-03-09 – 2014-03-11 (×7): 600 mg via ORAL
  Filled 2014-03-09 (×7): qty 1

## 2014-03-09 MED ORDER — FENTANYL 2.5 MCG/ML BUPIVACAINE 1/10 % EPIDURAL INFUSION (WH - ANES)
14.0000 mL/h | INTRAMUSCULAR | Status: DC | PRN
  Administered 2014-03-09: 14 mL/h via EPIDURAL
  Filled 2014-03-09: qty 125

## 2014-03-09 MED ORDER — SCOPOLAMINE 1 MG/3DAYS TD PT72
MEDICATED_PATCH | TRANSDERMAL | Status: DC | PRN
  Administered 2014-03-09: 1 via TRANSDERMAL

## 2014-03-09 MED ORDER — EPHEDRINE 5 MG/ML INJ: 10.0000 mg | INTRAVENOUS | Status: DC | PRN

## 2014-03-09 MED ORDER — DEXAMETHASONE SODIUM PHOSPHATE 10 MG/ML IJ SOLN
INTRAMUSCULAR | Status: AC
  Filled 2014-03-09: qty 1

## 2014-03-09 MED ORDER — LACTATED RINGERS IV SOLN
INTRAVENOUS | Status: DC
  Administered 2014-03-10: 02:00:00 via INTRAVENOUS

## 2014-03-09 MED ORDER — DEXTROSE 5 % IV SOLN
1.0000 ug/kg/h | INTRAVENOUS | Status: DC | PRN
  Filled 2014-03-09: qty 2

## 2014-03-09 MED ORDER — DEXAMETHASONE SODIUM PHOSPHATE 4 MG/ML IJ SOLN
INTRAMUSCULAR | Status: DC | PRN
  Administered 2014-03-09: 10 mg via INTRAVENOUS

## 2014-03-09 MED ORDER — MORPHINE SULFATE 0.5 MG/ML IJ SOLN
INTRAMUSCULAR | Status: AC
  Filled 2014-03-09: qty 10

## 2014-03-09 MED ORDER — ACETAMINOPHEN 650 MG RE SUPP
325.0000 mg | Freq: Once | RECTAL | Status: AC
  Administered 2014-03-09: 325 mg via RECTAL

## 2014-03-09 MED ORDER — NALOXONE HCL 0.4 MG/ML IJ SOLN: 0.4000 mg | INTRAMUSCULAR | Status: DC | PRN

## 2014-03-09 MED ORDER — FENTANYL CITRATE 0.05 MG/ML IJ SOLN
INTRAMUSCULAR | Status: AC
  Filled 2014-03-09: qty 2

## 2014-03-09 MED ORDER — FENTANYL 2.5 MCG/ML BUPIVACAINE 1/10 % EPIDURAL INFUSION (WH - ANES)
INTRAMUSCULAR | Status: DC | PRN
  Administered 2014-03-09: 14 mL/h via EPIDURAL

## 2014-03-09 MED ORDER — MENTHOL 3 MG MT LOZG: 1.0000 | LOZENGE | OROMUCOSAL | Status: DC | PRN

## 2014-03-09 MED ORDER — TETANUS-DIPHTH-ACELL PERTUSSIS 5-2.5-18.5 LF-MCG/0.5 IM SUSP: 0.5000 mL | Freq: Once | INTRAMUSCULAR | Status: DC

## 2014-03-09 MED ORDER — LIDOCAINE HCL (PF) 1 % IJ SOLN
INTRAMUSCULAR | Status: DC | PRN
  Administered 2014-03-09 (×2): 5 mL

## 2014-03-09 MED ORDER — IBUPROFEN 600 MG PO TABS: 600.0000 mg | ORAL_TABLET | Freq: Four times a day (QID) | ORAL | Status: DC | PRN

## 2014-03-09 MED ORDER — GENTAMICIN SULFATE 40 MG/ML IJ SOLN
170.0000 mg | Freq: Three times a day (TID) | INTRAMUSCULAR | Status: DC
  Administered 2014-03-09: 170 mg via INTRAVENOUS
  Filled 2014-03-09 (×3): qty 4.25

## 2014-03-09 MED ORDER — NALBUPHINE HCL 10 MG/ML IJ SOLN: 5.0000 mg | Freq: Once | INTRAMUSCULAR | Status: AC | PRN

## 2014-03-09 MED ORDER — NALBUPHINE HCL 10 MG/ML IJ SOLN: 5.0000 mg | INTRAMUSCULAR | Status: DC | PRN

## 2014-03-09 MED ORDER — KETOROLAC TROMETHAMINE 30 MG/ML IJ SOLN: 30.0000 mg | Freq: Four times a day (QID) | INTRAMUSCULAR | Status: AC | PRN

## 2014-03-09 MED ORDER — KETOROLAC TROMETHAMINE 30 MG/ML IJ SOLN
30.0000 mg | Freq: Four times a day (QID) | INTRAMUSCULAR | Status: AC | PRN
  Administered 2014-03-09: 30 mg via INTRAMUSCULAR

## 2014-03-09 MED ORDER — ONDANSETRON HCL 4 MG/2ML IJ SOLN
INTRAMUSCULAR | Status: AC
  Filled 2014-03-09: qty 2

## 2014-03-09 MED ORDER — MEPERIDINE HCL 25 MG/ML IJ SOLN: 6.2500 mg | INTRAMUSCULAR | Status: DC | PRN

## 2014-03-09 MED ORDER — WITCH HAZEL-GLYCERIN EX PADS: 1.0000 "application " | MEDICATED_PAD | CUTANEOUS | Status: DC | PRN

## 2014-03-09 MED ORDER — SODIUM BICARBONATE 8.4 % IV SOLN
INTRAVENOUS | Status: DC | PRN
  Administered 2014-03-09 (×4): 5 mL via EPIDURAL

## 2014-03-09 MED ORDER — LACTATED RINGERS IV SOLN
INTRAVENOUS | Status: DC | PRN
  Administered 2014-03-09 (×4): via INTRAVENOUS

## 2014-03-09 MED ORDER — GENTAMICIN SULFATE 40 MG/ML IJ SOLN
170.0000 mg | Freq: Three times a day (TID) | INTRAVENOUS | Status: AC
  Administered 2014-03-09 – 2014-03-10 (×3): 170 mg via INTRAVENOUS
  Filled 2014-03-09 (×3): qty 4.25

## 2014-03-09 MED ORDER — CHLOROPROCAINE HCL 3 % IJ SOLN
INTRAMUSCULAR | Status: DC | PRN
  Administered 2014-03-09: 10 mL

## 2014-03-09 MED ORDER — MORPHINE SULFATE (PF) 0.5 MG/ML IJ SOLN
INTRAMUSCULAR | Status: DC | PRN
  Administered 2014-03-09: 4 mg via EPIDURAL
  Administered 2014-03-09: .5 mg via INTRAVENOUS
  Administered 2014-03-09: .5 mg via EPIDURAL

## 2014-03-09 MED ORDER — SIMETHICONE 80 MG PO CHEW
80.0000 mg | CHEWABLE_TABLET | ORAL | Status: DC
  Administered 2014-03-09 – 2014-03-10 (×2): 80 mg via ORAL
  Filled 2014-03-09 (×2): qty 1

## 2014-03-09 MED ORDER — DIPHENHYDRAMINE HCL 25 MG PO CAPS: 25.0000 mg | ORAL_CAPSULE | ORAL | Status: DC | PRN

## 2014-03-09 MED ORDER — VANCOMYCIN HCL IN DEXTROSE 1-5 GM/200ML-% IV SOLN
1000.0000 mg | Freq: Two times a day (BID) | INTRAVENOUS | Status: DC
  Administered 2014-03-09: 1000 mg via INTRAVENOUS
  Filled 2014-03-09 (×2): qty 200

## 2014-03-09 MED ORDER — PRENATAL MULTIVITAMIN CH
1.0000 | ORAL_TABLET | Freq: Every day | ORAL | Status: DC
  Administered 2014-03-10 – 2014-03-11 (×2): 1 via ORAL
  Filled 2014-03-09 (×2): qty 1

## 2014-03-09 MED ORDER — SENNOSIDES-DOCUSATE SODIUM 8.6-50 MG PO TABS
2.0000 | ORAL_TABLET | ORAL | Status: DC
  Administered 2014-03-09 – 2014-03-10 (×2): 2 via ORAL
  Filled 2014-03-09 (×2): qty 2

## 2014-03-09 MED ORDER — ONDANSETRON HCL 4 MG/2ML IJ SOLN: 4.0000 mg | INTRAMUSCULAR | Status: DC | PRN

## 2014-03-09 MED ORDER — SIMETHICONE 80 MG PO CHEW
80.0000 mg | CHEWABLE_TABLET | Freq: Three times a day (TID) | ORAL | Status: DC
  Administered 2014-03-10 – 2014-03-11 (×4): 80 mg via ORAL
  Filled 2014-03-09 (×4): qty 1

## 2014-03-09 MED ORDER — PHENYLEPHRINE 40 MCG/ML (10ML) SYRINGE FOR IV PUSH (FOR BLOOD PRESSURE SUPPORT)
80.0000 ug | PREFILLED_SYRINGE | INTRAVENOUS | Status: AC | PRN
  Administered 2014-03-09: 80 ug via INTRAVENOUS
  Administered 2014-03-09: 40 ug via INTRAVENOUS
  Administered 2014-03-09: 80 ug via INTRAVENOUS
  Administered 2014-03-09: 40 ug via INTRAVENOUS
  Administered 2014-03-09 (×2): 80 ug via INTRAVENOUS
  Filled 2014-03-09: qty 20

## 2014-03-09 MED ORDER — OXYTOCIN 40 UNITS IN LACTATED RINGERS INFUSION - SIMPLE MED: 62.5000 mL/h | INTRAVENOUS | Status: AC

## 2014-03-09 MED ORDER — PROMETHAZINE HCL 25 MG/ML IJ SOLN: 6.2500 mg | INTRAMUSCULAR | Status: DC | PRN

## 2014-03-09 MED ORDER — HYDROMORPHONE HCL 1 MG/ML IJ SOLN
INTRAMUSCULAR | Status: AC
  Administered 2014-03-09: 0.5 mg via INTRAVENOUS
  Filled 2014-03-09: qty 1

## 2014-03-09 MED ORDER — PHENYLEPHRINE 40 MCG/ML (10ML) SYRINGE FOR IV PUSH (FOR BLOOD PRESSURE SUPPORT): 80.0000 ug | PREFILLED_SYRINGE | INTRAVENOUS | Status: DC | PRN

## 2014-03-09 MED ORDER — HYDROMORPHONE HCL 1 MG/ML IJ SOLN
0.2500 mg | INTRAMUSCULAR | Status: DC | PRN
  Administered 2014-03-09: 0.5 mg via INTRAVENOUS

## 2014-03-09 MED ORDER — ONDANSETRON HCL 4 MG/2ML IJ SOLN
INTRAMUSCULAR | Status: DC | PRN
  Administered 2014-03-09: 4 mg via INTRAVENOUS

## 2014-03-09 MED ORDER — BUPIVACAINE HCL (PF) 0.25 % IJ SOLN
INTRAMUSCULAR | Status: DC | PRN
  Administered 2014-03-09: 30 mL

## 2014-03-09 SURGICAL SUPPLY — 30 items
BENZOIN TINCTURE PRP APPL 2/3 (GAUZE/BANDAGES/DRESSINGS) ×3 IMPLANT
CLAMP CORD UMBIL (MISCELLANEOUS) IMPLANT
CLOSURE WOUND 1/2 X4 (GAUZE/BANDAGES/DRESSINGS) ×1
CLOTH BEACON ORANGE TIMEOUT ST (SAFETY) ×3 IMPLANT
DRAPE SHEET LG 3/4 BI-LAMINATE (DRAPES) IMPLANT
DRSG OPSITE POSTOP 4X10 (GAUZE/BANDAGES/DRESSINGS) ×3 IMPLANT
DURAPREP 26ML APPLICATOR (WOUND CARE) ×3 IMPLANT
ELECT REM PT RETURN 9FT ADLT (ELECTROSURGICAL) ×3
ELECTRODE REM PT RTRN 9FT ADLT (ELECTROSURGICAL) ×1 IMPLANT
EXTRACTOR VACUUM M CUP 4 TUBE (SUCTIONS) IMPLANT
EXTRACTOR VACUUM M CUP 4' TUBE (SUCTIONS)
GLOVE BIOGEL PI IND STRL 7.0 (GLOVE) ×1 IMPLANT
GLOVE BIOGEL PI INDICATOR 7.0 (GLOVE) ×2
GLOVE ECLIPSE 7.0 STRL STRAW (GLOVE) ×6 IMPLANT
GOWN STRL REUS W/TWL LRG LVL3 (GOWN DISPOSABLE) ×6 IMPLANT
KIT ABG SYR 3ML LUER SLIP (SYRINGE) IMPLANT
NEEDLE HYPO 22GX1.5 SAFETY (NEEDLE) ×3 IMPLANT
NEEDLE HYPO 25X5/8 SAFETYGLIDE (NEEDLE) IMPLANT
NS IRRIG 1000ML POUR BTL (IV SOLUTION) ×3 IMPLANT
PACK C SECTION WH (CUSTOM PROCEDURE TRAY) ×3 IMPLANT
PAD OB MATERNITY 4.3X12.25 (PERSONAL CARE ITEMS) ×3 IMPLANT
RTRCTR C-SECT PINK 25CM LRG (MISCELLANEOUS) ×3 IMPLANT
STAPLER VISISTAT 35W (STAPLE) IMPLANT
STRIP CLOSURE SKIN 1/2X4 (GAUZE/BANDAGES/DRESSINGS) ×2 IMPLANT
SUT VIC AB 0 CTX 36 (SUTURE) ×6
SUT VIC AB 0 CTX36XBRD ANBCTRL (SUTURE) ×3 IMPLANT
SUT VIC AB 4-0 KS 27 (SUTURE) ×3 IMPLANT
SYR 30ML LL (SYRINGE) ×3 IMPLANT
TOWEL OR 17X24 6PK STRL BLUE (TOWEL DISPOSABLE) ×3 IMPLANT
TRAY FOLEY CATH 14FR (SET/KITS/TRAYS/PACK) ×3 IMPLANT
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
## 2014-03-09 NOTE — Op Note (Signed)
Tasha Barrett PROCEDURE DATE: 03/09/2014  PREOPERATIVE DIAGNOSES: Intrauterine pregnancy at [redacted]w[redacted]d weeks gestation; fetal intolerance of labor, failure to progress, chorioamnionitis  POSTOPERATIVE DIAGNOSES: The same, occiput posterior, thick meconium  PROCEDURE: Primary Low Transverse Cesarean Section  SURGEON:  Dr. Darron Doom  ASSISTANT:  Dr. Nila Nephew  ANESTHESIOLOGIST: Dr. Jillyn Hidden  INDICATIONS: Tasha Barrett is a 24 y.o. G1P1001 at [redacted]w[redacted]d here for cesarean section secondary to the indications listed under preoperative diagnoses; please see preoperative note for further details.  Patient ruptured yesterday morning at 0700, initially 1/50/-3, FB placed and out by 2000 at which time patient was 4.5/70-2 from which she did not change despite continued induction with pitocin.  FHT also with minimal variability since 0600, and repetitive lates despites fluids/O2 and pit break.  Patient has had .   The risks of cesarean section were discussed with the patient including but were not limited to: bleeding which may require transfusion or reoperation; infection which may require antibiotics; injury to bowel, bladder, ureters or other surrounding organs; injury to the fetus; need for additional procedures including hysterectomy in the event of a life-threatening hemorrhage; placental abnormalities wth subsequent pregnancies, incisional problems, thromboembolic phenomenon and other postoperative/anesthesia complications.   The patient concurred with the proposed plan, giving informed written consent for the procedure.    FINDINGS:  Viable female infant in cephalic presentation.  Apgars 9 and 9.  Clear amniotic fluid.  Intact placenta, three vessel cord.  Normal uterus, fallopian tubes and ovaries bilaterally.  ANESTHESIA: Epidural INTRAVENOUS FLUIDS: 2500 ml ESTIMATED BLOOD LOSS: 800 ml URINE OUTPUT:  150 ml SPECIMENS: Placenta sent to pathology COMPLICATIONS: None immediate  PROCEDURE IN  DETAIL:  The patient preoperatively received intravenous antibiotics and had sequential compression devices applied to her lower extremities.  She was then taken to the operating room where the epidural anesthesia was dosed up to surgical level and was found to be adequate. She was then placed in a dorsal supine position with a leftward tilt, and prepped and draped in a sterile manner.  A foley catheter was placed into her bladder and attached to constant gravity.  After an adequate timeout was performed, a Pfannenstiel skin incision was made with scalpel and carried through to the underlying layer of fascia. The fascia was incised in the midline, and this incision was extended bilaterally using the Mayo scissors.  Kocher clamps were applied to the superior aspect of the fascial incision and the underlying rectus muscles were dissected off bluntly. A similar process was carried out on the inferior aspect of the fascial incision. The rectus muscles were separated in the midline bluntly and the peritoneum was entered bluntly. Attention was turned to the lower uterine segment where a low transverse hysterotomy was made with a scalpel and extended bilaterally bluntly, thick meconium noted.  The infant was successfully delivered, the cord was clamped and cut and the infant was handed over to awaiting neonatology team. Uterine massage was then administered, and the meconium stained-placenta delivered intact with a three-vessel cord. The uterus was then cleared of clot and debris, myometrium meconium stained.  The hysterotomy was closed with 0 Vicryl in a running locked fashion, and an imbricating layer was also placed with 0 Vicryl. The pelvis was cleared of all clot and debris. Hemostasis was confirmed on all surfaces.  The peritoneum and the muscles were reapproximated using 0 Vicryl interrupted stitches. The fascia was then closed using 0 Vicryl in a running fashion.  The subcutaneous layer was irrigated, and 30  ml of  0.25% Bupivacaine was injected subcutaneously around the incision.  The skin was closed with a 4-0 Vicryl subcuticular stitch. The patient tolerated the procedure well. Sponge, lap, instrument and needle counts were correct x 2.  She was taken to the recovery room in stable condition.   Merla Riches, MD OB Fellow Faculty Practice, [facility]

## 2014-03-09 NOTE — Progress Notes (Signed)
ANTIBIOTIC CONSULT NOTE - INITIAL  Pharmacy Consult for Gentamicin Indication: Chorioamnionitis   Allergies  Allergen Reactions  . Amoxicillin-Pot Clavulanate Other (See Comments)    Reaction childhood  . Latex     Possible yeast infection.    Patient Measurements: Height: 5' 2.5" (158.8 cm) Weight: 227 lb (102.967 kg) IBW/kg (Calculated) : 51.25 Adjusted Body Weight: 66.8  Vital Signs: Temp: 101.8 F (38.8 C) (02/02 0800) Temp Source: Oral (02/02 0800) BP: 145/66 mmHg (02/02 0830) Pulse Rate: 120 (02/02 0830)  Labs:  Recent Labs  03/08/14 0930  WBC 7.3  HGB 9.5*  PLT 174   No results for input(s): GENTTROUGH, GENTPEAK, GENTRANDOM in the last 72 hours.   Microbiology: No results found for this or any previous visit (from the past 720 hour(s)).  Medications:  Vancomycin 1 gram IV every 12 hours.  Assessment: 24 y.o. female G1P0 at [redacted]w[redacted]d with maternal temp and PCN allergy- childhood rxn Estimated Ke = 0.303, Vd = 0.4L/kg  Goal of Therapy:  Gentamicin peak 6-8 mg/L and Trough < 1 mg/L  Plan:   Gentamicin 170 mg IV every 8 hrs  Check Scr with next labs if gentamicin continued. Will check gentamicin levels if continued > 72hr or clinically indicated.  Dionne Milo 03/09/2014,9:04 AM

## 2014-03-09 NOTE — Addendum Note (Signed)
Addendum  created 03/09/14 1701 by Elenore Paddy, CRNA   Modules edited: Charges VN, Notes Section   Notes Section:  File: [id number]

## 2014-03-09 NOTE — Anesthesia Procedure Notes (Signed)
Epidural Patient location during procedure: OB Start time: 03/09/2014 5:51 AM  Staffing Anesthesiologist: Rudean Curt Performed by: anesthesiologist   Preanesthetic Checklist Completed: patient identified, site marked, surgical consent, pre-op evaluation, timeout performed, IV checked, risks and benefits discussed and monitors and equipment checked  Epidural Patient position: sitting Prep: site prepped and draped and DuraPrep Patient monitoring: continuous pulse ox and blood pressure Approach: midline Location: L3-L4 Injection technique: LOR air  Needle:  Needle type: Tuohy  Needle gauge: 17 G Needle length: 9 cm and 9 Needle insertion depth: 6 cm Catheter type: closed end flexible Catheter size: 19 Gauge Catheter at skin depth: 10 cm Test dose: negative  Assessment Events: blood not aspirated, injection not painful, no injection resistance, negative IV test and no paresthesia  Additional Notes Patient identified.  Risk benefits discussed including failed block, incomplete pain control, headache, nerve damage, paralysis, blood pressure changes, nausea, vomiting, reactions to medication both toxic or allergic, and postpartum back pain.  Patient expressed understanding and wished to proceed.  All questions were answered.  Sterile technique used throughout procedure and epidural site dressed with sterile barrier dressing. No paresthesia or other complications noted.The patient did not experience any signs of intravascular injection such as tinnitus or metallic taste in mouth nor signs of intrathecal spread such as rapid motor block. Please see nursing notes for vital signs.
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
## 2014-03-09 NOTE — Plan of Care (Signed)
Problem: Phase I Progression Outcomes Goal: Assess per MD/Nurse,Routine-VS,FHR,UC,Head to Toe assess Outcome: Completed/Met Date Met:  03/09/14 Pt febrile. ABX given for chorioamnionitis.  Goal: Pain controlled with appropriate interventions Outcome: Completed/Met Date Met:  03/09/14 Pt has epidural

## 2014-03-09 NOTE — Progress Notes (Signed)
Tasha Barrett is a 24 y.o. G1P0 at [redacted]w[redacted]d by LMP admitted for PROM  Subjective: Feels warm  Objective: BP 143/67 mmHg  Pulse 121  Temp(Src) 102.3 F (39.1 C) (Oral)  Resp 22  Ht 5' 2.5" (1.588 m)  Wt 227 lb (102.967 kg)  BMI 40.83 kg/m2  LMP 05/27/2013      FHT:  FHR: 180 bpm, variability: minimal ,  accelerations:  Abscent,  decelerations:  Present repetitive lates UC:   regular, every 2-5 minutes SVE:   Dilation: 4.5 Effacement (%): 80 Station: -2 Exam by:: [employer] CNM   Labs: Lab Results  Component Value Date   WBC 7.3 03/08/2014   HGB 9.5* 03/08/2014   HCT 30.1* 03/08/2014   MCV 76.0* 03/08/2014   PLT 174 03/08/2014    Assessment / Plan: Protracted latent phase  Labor: Little progress Preeclampsia:  no signs or symptoms of toxicity Fetal Wellbeing:  Category III Pain Control:  Epidural I/D:  n/a Anticipated MOD:  given remoteness from delivery and chorio and non-reassuring FHR--will proceed with abdominal delivery.  Antibiotics given. Tylenol and fluid bolus to decrease FHR baseline and improve temperature. Pitocin is off.  Will move to delivery. Risks include but are not limited to bleeding, infection, injury to surrounding structures, including bowel, bladder and ureters, blood clots, and death.  Likelihood of success is high, postoperative infection risk is high--will continue Abx for 24 hours pp.   PRATT,TANYA S 03/09/2014, 10:38 AM

## 2014-03-09 NOTE — Progress Notes (Signed)
LABOR PROGRESS NOTE  Tasha Barrett is a 24 y.o. G1P0 at [redacted]w[redacted]d admitted for rupture of membranes now in early active labor.    Subjective: Anesthesia at bedside for epidural.   Objective: BP 132/51 mmHg  Pulse 102  Temp(Src) 99.7 F (37.6 C) (Oral)  Resp 20  Ht 5' 2.5" (1.588 m)  Wt 227 lb (102.967 kg)  BMI 40.83 kg/m2  LMP 05/27/2013 or  Filed Vitals:   03/09/14 0330 03/09/14 0401 03/09/14 0430 03/09/14 0500  BP: 124/66 127/55 135/63 132/51  Pulse: 91 94 102 102  Temp:  99.7 F (37.6 C)    TempSrc:      Resp: 20 20 20 20   Height:      Weight:           FHT:  FHR: 155 bpm, variability: moderate,  accelerations:  Present,  decelerations:  Absent UC:   Regular every 2-4 minutes SVE:   Dilation: 4.5 Effacement (%): 70 Station: -2 Exam by:: TWillis RNC  Dilation: 4.5 Effacement (%): 70 Station: -2 Presentation: Vertex Exam by:: TWillis RNC   Pitocin @ 6  Labs: Lab Results  Component Value Date   WBC 7.3 03/08/2014   HGB 9.5* 03/08/2014   HCT 30.1* 03/08/2014   MCV 76.0* 03/08/2014   PLT 174 03/08/2014    Assessment / Plan: Induction of labor secondary to PROM without onset of labor.   Labor: Progressing well. Foley bulb out and 4-5/70/-2 per nursing. No cervical change since foley bulb out despite regular contractions. Pitocin started. Continue to increase per protocol. Given increasing pain with contractions, will get epidural now. If unable to adequate titrate pitocin for appropriate cervical change would consider placement of IUPC.  Fetal Wellbeing:  Category I Pain Control:  Epidural.  Anticipated MOD:  NSVD  Shelbie Hutching, MD 03/09/2014, 5:59 AM

## 2014-03-09 NOTE — Transfer of Care (Signed)
Immediate Anesthesia Transfer of Care Note  Patient: Tasha Barrett  Procedure(s) Performed: Procedure(s): CESAREAN SECTION (N/A)  Patient Location: PACU  Anesthesia Type:Epidural  Level of Consciousness: awake, alert  and oriented  Airway & Oxygen Therapy: Patient Spontanous Breathing  Post-op Assessment: Report given to RN and Post -op Vital signs reviewed and stable  Post vital signs: Reviewed and stable  Last Vitals:  Filed Vitals:   03/09/14 1154  BP: 115/42  Pulse: 119  Temp:   Resp:     Complications: No apparent anesthesia complications

## 2014-03-09 NOTE — Addendum Note (Signed)
Addendum  created 03/09/14 1528 by Lyn Hollingshead, MD   Modules edited: Anesthesia Review and Sign Navigator Section, Clinical Notes   Clinical Notes:  File: [id number]

## 2014-03-09 NOTE — Progress Notes (Signed)
LABOR PROGRESS NOTE  Tasha Barrett is a 24 y.o. G1P0 at [redacted]w[redacted]d admitted for rupture of membranes now in early active labor.    Subjective: Doula at bedside. Increasing pain with contractions.   Objective: BP 121/78 mmHg  Pulse 91  Temp(Src) 99 F (37.2 C) (Oral)  Resp 20  Ht 5' 2.5" (1.588 m)  Wt 227 lb (102.967 kg)  BMI 40.83 kg/m2  LMP 05/27/2013 or  Filed Vitals:   03/08/14 2147 03/08/14 2347 03/09/14 0032 03/09/14 0100  BP: 137/62 146/77 130/69 121/78  Pulse: 99 102 88 91  Temp: 99.1 F (37.3 C) 99 F (37.2 C)    TempSrc:      Resp: 20 18 20 20   Height:      Weight:           FHT:  FHR: 155 bpm, variability: moderate,  accelerations:  Present,  decelerations:  Absent UC:   Regular every 2-4 minutes SVE:   Dilation: 4.5 Effacement (%): 70 Station: -2 Exam by:: TWillis RNC  Dilation: 4.5 Effacement (%): 70 Station: -2 Presentation: Vertex Exam by:: TWillis RNC   Pitocin @ 2  Labs: Lab Results  Component Value Date   WBC 7.3 03/08/2014   HGB 9.5* 03/08/2014   HCT 30.1* 03/08/2014   MCV 76.0* 03/08/2014   PLT 174 03/08/2014    Assessment / Plan: Induction of labor secondary to PROM without onset of labor.   Labor: Progressing well. Foley bulb out and 4-5/70/-2 per nursing. No cervical change since foley bulb out despite regular contractions. Pitocin started. Continue to increase per protocol.  Fetal Wellbeing:  Category I Pain Control:  Labor support without medications.  Anticipated MOD:  NSVD  Shelbie Hutching, MD 03/09/2014, 2:15 AM

## 2014-03-09 NOTE — Plan of Care (Signed)
Problem: Phase I Progression Outcomes Goal: Assess/evaluate labor progress and adequacy Outcome: Not Met (add Reason) Pt for cesarean section Goal: Assess/evaluate cervical exam prn (q2hrs in active phase) Outcome: Not Met (add Reason) Pt not in active lavor

## 2014-03-09 NOTE — Anesthesia Postprocedure Evaluation (Signed)
  Anesthesia Post-op Note  Patient: Tasha Barrett  Procedure(s) Performed: Procedure(s): CESAREAN SECTION (N/A)  Patient Location: PACU and Mother/Baby  Anesthesia Type:Epidural  Level of Consciousness: awake, alert  and oriented  Airway and Oxygen Therapy: Patient Spontanous Breathing  Post-op Pain: mild  Post-op Assessment: Post-op Vital signs reviewed, Patient's Cardiovascular Status Stable, Respiratory Function Stable, No signs of Nausea or vomiting, Adequate PO intake, Pain level controlled, No headache, No backache, No residual numbness and No residual motor weakness  Post-op Vital Signs: Reviewed and stable  Last Vitals:  Filed Vitals:   03/09/14 1640  BP: 118/57  Pulse: 75  Temp: 36.8 C  Resp: 16    Complications: No apparent anesthesia complications

## 2014-03-09 NOTE — Anesthesia Preprocedure Evaluation (Addendum)
Anesthesia Evaluation  Patient identified by MRN, date of birth, ID band Patient awake    Reviewed: Allergy & Precautions, H&P , Patient's Chart, lab work & pertinent test results  Airway Mallampati: III  TM Distance: >3 FB Neck ROM: full    Dental   Pulmonary former smoker,  breath sounds clear to auscultation        Cardiovascular Rhythm:regular Rate:Normal     Neuro/Psych PSYCHIATRIC DISORDERS    GI/Hepatic   Endo/Other  Morbid obesity  Renal/GU      Musculoskeletal   Abdominal   Peds  Hematology   Anesthesia Other Findings   Reproductive/Obstetrics (+) Pregnancy                            Anesthesia Physical Anesthesia Plan  ASA: III  Anesthesia Plan: Epidural   Post-op Pain Management:    Induction:   Airway Management Planned:   Additional Equipment:   Intra-op Plan:   Post-operative Plan:   Informed Consent: I have reviewed the patients History and Physical, chart, labs and discussed the procedure including the risks, benefits and alternatives for the proposed anesthesia with the patient or authorized representative who has indicated his/her understanding and acceptance.     Plan Discussed with:   Anesthesia Plan Comments: (For C/S)       Anesthesia Quick Evaluation

## 2014-03-09 NOTE — Lactation Note (Signed)
This note was copied from the chart of Tasha Zailynn Brandel. Lactation Consultation Note Initial visit at 9 hours of age.  Mom reports baby has had some spoon feedings, but has been sleepy.  Baby was showing feeding cues so mom has been hand pumping and expressing colostrum into spoon for feeding.  Baby back to sleep in crib.  Demonstrated waking techniques and assisted mom with spoon feeding baby 10mls of colosotrum.  Baby didn't extend tongue well and mom dropped colostrum into baby's mouth.  Baby has tight pursed lips.   Difficult to get gloved finger in baby's mouth, but baby does suck well.  When placed to breast baby does not open mouth for latch.  Instructed on suck training and jaw massage to help relax baby's mouth.  Baby left STS on mom's chest with eyes open.  [facility] LC resources given and discussed.  Encouraged to feed with early cues on demand.  Early newborn behavior discussed.  Hand expression demonstrated with colostrum visible.  Mom to call for assist as needed.      Patient Name: Tasha Barrett [id number] Date: 03/09/2014 Reason for consult: Initial assessment   Maternal Data Has patient been taught Hand Expression?: Yes Does the patient have breastfeeding experience prior to this delivery?: No  Feeding Feeding Type: Breast Fed  LATCH Score/Interventions Latch: Too sleepy or reluctant, no latch achieved, no sucking elicited.  Audible Swallowing: None  Type of Nipple: Flat Intervention(s): Hand pump  Comfort (Breast/Nipple): Soft / non-tender     Hold (Positioning): Assistance needed to correctly position infant at breast and maintain latch. Intervention(s): Breastfeeding basics reviewed;Support Pillows;Position options;Skin to skin  LATCH Score: 4  Lactation Tools Discussed/Used     Consult Status Consult Status: Follow-up Date: 03/10/14 Follow-up type: In-patient    Justice Britain 03/09/2014, 10:49 PM

## 2014-03-09 NOTE — Anesthesia Postprocedure Evaluation (Signed)
Anesthesia Post Note  Patient: Tasha Barrett  Procedure(s) Performed: Procedure(s) (LRB): CESAREAN SECTION (N/A)  Anesthesia type: Epidural  Patient location: PACU  Post pain: Pain level controlled  Post assessment: Post-op Vital signs reviewed  Last Vitals:  Filed Vitals:   03/09/14 1500  BP: 114/60  Pulse: 84  Temp: 37.4 C  Resp: 18    Post vital signs: Reviewed  Level of consciousness: awake  Complications: No apparent anesthesia complications

## 2014-03-10 ENCOUNTER — Encounter (HOSPITAL_COMMUNITY): Payer: Self-pay | Admitting: Family Medicine

## 2014-03-10 LAB — CBC
HEMATOCRIT: 25.8 % — AB (ref 36.0–46.0)
Hemoglobin: 8.1 g/dL — ABNORMAL LOW (ref 12.0–15.0)
MCH: 23.8 pg — AB (ref 26.0–34.0)
MCHC: 31.4 g/dL (ref 30.0–36.0)
MCV: 75.9 fL — AB (ref 78.0–100.0)
Platelets: 149 10*3/uL — ABNORMAL LOW (ref 150–400)
RBC: 3.4 MIL/uL — AB (ref 3.87–5.11)
RDW: 17 % — ABNORMAL HIGH (ref 11.5–15.5)
WBC: 20.6 10*3/uL — AB (ref 4.0–10.5)

## 2014-03-10 NOTE — Anesthesia Postprocedure Evaluation (Signed)
  Anesthesia Post-op Note  Patient: Tasha Barrett  Procedure(s) Performed: Procedure(s): CESAREAN SECTION (N/A)  Patient Location: Mother/Baby  Anesthesia Type:Epidural  Level of Consciousness: awake, alert , oriented and patient cooperative  Airway and Oxygen Therapy: Patient Spontanous Breathing  Post-op Pain: mild  Post-op Assessment: Patient's Cardiovascular Status Stable, Respiratory Function Stable, No headache, No backache, No residual numbness and No residual motor weakness  Post-op Vital Signs: stable  Last Vitals:  Filed Vitals:   03/10/14 0615  BP: 115/49  Pulse: 80  Temp: 37 C  Resp: 18    Complications: No apparent anesthesia complications
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
## 2014-03-10 NOTE — Lactation Note (Signed)
This note was copied from the chart of Tasha Barrett. Lactation Consultation Note  Mom called out for assist shortly after leaving room.  Baby is showing early feeding cues.  20 mm nipple shield applied to left breast. Baby latched easily and nursed for 10 minutes.  Nipple shield full of colostrum when baby came off.  Mom praised and encouraged to call for further assist.  Patient Name: Tasha Barrett [id number] Date: 03/10/2014 Reason for consult: Follow-up assessment   Maternal Data Has patient been taught Hand Expression?: Yes  Feeding Feeding Type: Breast Fed Length of feed: 10 min  LATCH Score/Interventions Latch: Repeated attempts needed to sustain latch, nipple held in mouth throughout feeding, stimulation needed to elicit sucking reflex.  Audible Swallowing: None  Type of Nipple: Flat Intervention(s): Reverse pressure;Hand pump  Comfort (Breast/Nipple): Soft / non-tender     Hold (Positioning): Assistance needed to correctly position infant at breast and maintain latch.  LATCH Score: 5  Lactation Tools Discussed/Used Tools: Nipple Shields Nipple shield size: 20   Consult Status      Marene Gilliam S 03/10/2014, 2:48 PM

## 2014-03-10 NOTE — Addendum Note (Signed)
Addendum  created 03/10/14 0839 by Georgeanne Nim, CRNA   Modules edited: Notes Section   Notes Section:  File: [id number]

## 2014-03-10 NOTE — Progress Notes (Cosign Needed)
Post Partum Day 1 Subjective: Tasha Barrett is a 24 year old G1P1 s/p pLTCS 2/2 NRFHT. She reports pain at the incision site that is well controlled with pain medications. She has light vaginal bleeding that she describes as "period-like". She voided 1 hour ago with no difficulty. She reports some light vaginal bleeding. She has decided to breastfeed her daughter. She is undecided about contraception.   Objective: Blood pressure 115/49, pulse 80, temperature 98.6 F (37 C), temperature source Oral, resp. rate 18, height 5' 2.5" (1.588 m), weight 102.967 kg (227 lb), last menstrual period 05/27/2013, SpO2 95 %, unknown if currently breastfeeding.  Physical Exam:  General: alert and no distress Lochia: appropriate Uterine Fundus: Firm Incision: no significant drainage, no significant erythema DVT Evaluation: No evidence of DVT seen on physical exam. No cords or calf tenderness.   Recent Labs  03/08/14 0930  HGB 9.5*  HCT 30.1*    Assessment/Plan: Breastfeeding   LOS: 2 days   Bianney Rockwood M 03/10/2014, 7:14 AM

## 2014-03-11 MED ORDER — IBUPROFEN 600 MG PO TABS: 600.0000 mg | ORAL_TABLET | Freq: Four times a day (QID) | ORAL | Status: DC

## 2014-03-11 MED ORDER — FLEET ENEMA 7-19 GM/118ML RE ENEM: 1.0000 | ENEMA | Freq: Once | RECTAL | Status: DC

## 2014-03-11 MED ORDER — OXYCODONE-ACETAMINOPHEN 5-325 MG PO TABS: 1.0000 | ORAL_TABLET | ORAL | Status: DC | PRN

## 2014-03-11 NOTE — Lactation Note (Signed)
This note was copied from the chart of Tasha Barrett. Lactation Consultation Note  Patient Name: Tasha Chanelle Hodsdon [id number] Date: 03/11/2014 Reason for consult: Follow-up assessment (pe rmom plans to feed once pictures are done - going on 4 hours )  Baby is 76 hours old and mom/ baby going home today . Mom has been breast and bottle . Per mom has been using  The #24 Nipple shield when the baby latches and has a lot of colostrum. LC reviewed supply and demand and establishing milk supply  And protecting the level. Per mom has a DEBP Medela at home and is more comfortable with #27 Flange. LC reviewed basics , sore nipple and engorgement prevention andtx .LC stressed the importance of feeding the baby every 3 hours and with feeding cues until The baby is gaining well , ans feed skin to skin.  Due to the use of nipple shield , offered mom an [facility] apt. And mom declined at consult and will plan on calling on [facility] office when she gets  settled at home.   Maternal Data    Feeding Feeding Type: Bottle Fed - Formula Nipple Type: Slow - flow  LATCH Score/Interventions                Intervention(s): Breastfeeding basics reviewed     Lactation Tools Discussed/Used     Consult Status Consult Status: Complete Date: 03/11/14    Myer Haff 03/11/2014, 1:58 PM

## 2014-03-11 NOTE — Discharge Summary (Signed)
Obstetric Discharge Summary Reason for Admission: cesarean section and fetal intolerance of labor, failure to progress, chorioamnioitis  Prenatal Procedures: ultrasound Intrapartum Procedures: cesarean: low cervical, transverse Postpartum Procedures: antibiotics Complications-Operative and Postpartum: none   Tasha Barrett is a 24 y.o. G47P1001 female who delivered a girl at [redacted]w[redacted]d via cesarean section on 03/09/14. Pt reports tolerating po liquids and solids, ambulating well, voiding and positive flatus. Pt denies nausea, vomiting, being lightheaded or dizzy. Pt reports some pain at the incision site, but states it has significantly improved over the last 24 hours and would like to be discharged home. She is breast feeding and does not want contraceptive methods at this time.   HEMOGLOBIN  Date Value Ref Range Status  03/10/2014 8.1* 12.0 - 15.0 g/dL Final   HCT  Date Value Ref Range Status  03/10/2014 25.8* 36.0 - 46.0 % Final    Physical Exam:  General: alert, cooperative, appears stated age and no distress Lochia: appropriate Uterine Fundus: firm Incision: healing well, no dehiscence, no significant erythema, some drainage present on dressing DVT Evaluation: No evidence of DVT seen on physical exam. No cords or calf tenderness. Calf/Ankle edema is present.  Discharge Diagnoses: Post-date pregnancy  Discharge Information: Date: 03/11/2014 Activity: pelvic rest Diet: routine Medications: PNV, Colace, Iron and Percocet Condition: stable Instructions: refer to practice specific booklet Discharge to: home   Newborn Data: Live born female  Birth Weight: 7 lb 10.1 oz (3460 g) APGAR: 9, 9  Home with mother.  Tasha Barrett 03/11/2014, 7:43 AM   I have seen and examined this patient and I agree with the above. Has been seen by SW to review PP depression. Will have a f/u at [facility] in 2 weeks to assess for this. Serita Grammes CNM 9:10 AM 03/11/2014

## 2014-03-11 NOTE — Progress Notes (Signed)
CSW completed follow up visit in order to continue to provide MOB emotional support.   MOB continues to be easily engaged and receptive to [place] visits.  MOB reported that CSW arrived at an "ideal" time since she wanted assistance to process her frustrations, thoughts, and feelings related to ongoing communication challenges within her relationship with the FOB.  MOB and FOB openly discussed their perceptions on factors that have led to break down in communication.  MOB expressed acute concern that there will be unnecessary conflict/arguing in the postpartum period since they are tired, overwhelmed, and often have a difficult time expressing themselves.  They both present with an understanding of need to improve communication; however, it was evident during the visit, that they have differing styles of communication.  MOB prefers to confront frustrations/stress in the moment, where as the FOB often prefers to reduce emotional intensity by removing himself from the situation prior to problem solving. MOB frequently internalizes the FOB's behaviors as "he does not care about how I feel".  MOB and FOB present with an understanding of need to acknowledge the other's feelings, but the MOB will require ongoing support to disengage from unhealthy automatic thoughts and core beliefs about herself.  MOB and FOB discussed that they have considered couple's therapy, but often find money and time as barriers.  They acknowledged that it will be important for them to consider their values and explore what is most important to them when deciding how to allocate time and money in the postpartum period.  CSW assisted the MOB and FOB to explore concepts of positive intention.  CSW also encouraged the MOB and FOB to focus on what is going well in their relationship/styles of communication due to tendency to focus on the negative components.  MOB and FOB ended the visit in positive spirits, and recognizing that although they feel  overwhelmed by the "work" that needs to be done within their relationship, they also have learned how to effectively communicate, support one another, and be "good parents".   There continue to be no barriers to discharge.

## 2014-03-11 NOTE — Discharge Instructions (Signed)
Postpartum Depression and Baby Blues °The postpartum period begins right after the birth of a baby. During this time, there is often a great amount of joy and excitement. It is also a time of many changes in the life of the parents. Regardless of how many times a mother gives birth, each child brings new challenges and dynamics to the family. It is not unusual to have feelings of excitement along with confusing shifts in moods, emotions, and thoughts. All mothers are at risk of developing postpartum depression or the "baby blues." These mood changes can occur right after giving birth, or they may occur many months after giving birth. The baby blues or postpartum depression can be mild or severe. Additionally, postpartum depression can go away rather quickly, or it can be a long-term condition.  °CAUSES °Raised hormone levels and the rapid drop in those levels are thought to be a main cause of postpartum depression and the baby blues. A number of hormones change during and after pregnancy. Estrogen and progesterone usually decrease right after the delivery of your baby. The levels of thyroid hormone and various cortisol steroids also rapidly drop. Other factors that play a role in these mood changes include major life events and genetics.  °RISK FACTORS °If you have any of the following risks for the baby blues or postpartum depression, know what symptoms to watch out for during the postpartum period. Risk factors that may increase the likelihood of getting the baby blues or postpartum depression include: °· Having a personal or family history of depression.   °· Having depression while being pregnant.   °· Having premenstrual mood issues or mood issues related to oral contraceptives. °· Having a lot of life stress.   °· Having marital conflict.   °· Lacking a social support network.   °· Having a baby with special needs.   °· Having health problems, such as diabetes.   °SIGNS AND SYMPTOMS °Symptoms of baby blues  include: °· Brief changes in mood, such as going from extreme happiness to sadness. °· Decreased concentration.   °· Difficulty sleeping.   °· Crying spells, tearfulness.   °· Irritability.   °· Anxiety.   °Symptoms of postpartum depression typically begin within the first month after giving birth. These symptoms include: °· Difficulty sleeping or excessive sleepiness.   °· Marked weight loss.   °· Agitation.   °· Feelings of worthlessness.   °· Lack of interest in activity or food.   °Postpartum psychosis is a very serious condition and can be dangerous. Fortunately, it is rare. Displaying any of the following symptoms is cause for immediate medical attention. Symptoms of postpartum psychosis include:  °· Hallucinations and delusions.   °· Bizarre or disorganized behavior.   °· Confusion or disorientation.   °DIAGNOSIS  °A diagnosis is made by an evaluation of your symptoms. There are no medical or lab tests that lead to a diagnosis, but there are various questionnaires that a health care provider may use to identify those with the baby blues, postpartum depression, or psychosis. Often, a screening tool called the Edinburgh Postnatal Depression Scale is used to diagnose depression in the postpartum period.  °TREATMENT °The baby blues usually goes away on its own in 1-2 weeks. Social support is often all that is needed. You will be encouraged to get adequate sleep and rest. Occasionally, you may be given medicines to help you sleep.  °Postpartum depression requires treatment because it can last several months or longer if it is not treated. Treatment may include individual or group therapy, medicine, or both to address any social, physiological, and psychological   factors that may play a role in the depression. Regular exercise, a healthy diet, rest, and social support may also be strongly recommended.  Postpartum psychosis is more serious and needs treatment right away. Hospitalization is often needed. HOME CARE  INSTRUCTIONS  Get as much rest as you can. Nap when the baby sleeps.   Exercise regularly. Some women find yoga and walking to be beneficial.   Eat a balanced and nourishing diet.   Do little things that you enjoy. Have a cup of tea, take a bubble bath, read your favorite magazine, or listen to your favorite music.  Avoid alcohol.   Ask for help with household chores, cooking, grocery shopping, or running errands as needed. Do not try to do everything.   Talk to people close to you about how you are feeling. Get support from your partner, family members, friends, or other new moms.  Try to stay positive in how you think. Think about the things you are grateful for.   Do not spend a lot of time alone.   Only take over-the-counter or prescription medicine as directed by your health care provider.  Keep all your postpartum appointments.   Let your health care provider know if you have any concerns.  SEEK MEDICAL CARE IF: You are having a reaction to or problems with your medicine. SEEK IMMEDIATE MEDICAL CARE IF:  You have suicidal feelings.   You think you may harm the baby or someone else. MAKE SURE YOU:  Understand these instructions.  Will watch your condition.  Will get help right away if you are not doing well or get worse. Document Released: 10/27/2003 Document Revised: 01/27/2013 Document Reviewed: 11/03/2012 [facility] Patient Information 2015 [place], [place]. This information is not intended to replace advice given to you by your health care provider. Make sure you discuss any questions you have with your health care provider. Cesarean Delivery, Care After Refer to this sheet in the next few weeks. These instructions provide you with information on caring for yourself after your procedure. Your health care provider may also give you specific instructions. Your treatment has been planned according to current medical practices, but problems sometimes occur. Call  your health care provider if you have any problems or questions after you go home. HOME CARE INSTRUCTIONS  Only take over-the-counter or prescription medications as directed by your health care provider.  Do not drink alcohol, especially if you are breastfeeding or taking medication to relieve pain.  Do not chew or smoke tobacco.  Continue to use good perineal care. Good perineal care includes:  Wiping your perineum from front to back.  Keeping your perineum clean.  Check your surgical cut (incision) daily for increased redness, drainage, swelling, or separation of skin.  Clean your incision gently with soap and water every day, and then pat it dry. If your health care provider says it is okay, leave the incision uncovered. Use a bandage (dressing) if the incision is draining fluid or appears irritated. If the adhesive strips across the incision do not fall off within 7 days, carefully peel them off.  Hug a pillow when coughing or sneezing until your incision is healed. This helps to relieve pain.  Do not use tampons or douche until your health care provider says it is okay.  Shower, wash your hair, and take tub baths as directed by your health care provider.  Wear a well-fitting bra that provides breast support.  Limit wearing support panties or control-top hose.  Drink enough  fluids to keep your urine clear or pale yellow.  Eat high-fiber foods such as whole grain cereals and breads, brown rice, beans, and fresh fruits and vegetables every day. These foods may help prevent or relieve constipation.  Resume activities such as climbing stairs, driving, lifting, exercising, or traveling as directed by your health care provider.  Talk to your health care provider about resuming sexual activities. This is dependent upon your risk of infection, your rate of healing, and your comfort and desire to resume sexual activity.  Try to have someone help you with your household activities and  your newborn for at least a few days after you leave the hospital.  Rest as much as possible. Try to rest or take a nap when your newborn is sleeping.  Increase your activities gradually.  Keep all of your scheduled postpartum appointments. It is very important to keep your scheduled follow-up appointments. At these appointments, your health care provider will be checking to make sure that you are healing physically and emotionally. SEEK MEDICAL CARE IF:   You are passing large clots from your vagina. Save any clots to show your health care provider.  You have a foul smelling discharge from your vagina.  You have trouble urinating.  You are urinating frequently.  You have pain when you urinate.  You have a change in your bowel movements.  You have increasing redness, pain, or swelling near your incision.  You have pus draining from your incision.  Your incision is separating.  You have painful, hard, or reddened breasts.  You have a severe headache.  You have blurred vision or see spots.  You feel sad or depressed.  You have thoughts of hurting yourself or your newborn.  You have questions about your care, the care of your newborn, or medications.  You are dizzy or light-headed.  You have a rash.  You have pain, redness, or swelling at the site of the removed intravenous access (IV) tube.  You have nausea or vomiting.  You stopped breastfeeding and have not had a menstrual period within 12 weeks of stopping.  You are not breastfeeding and have not had a menstrual period within 12 weeks of delivery.  You have a fever. SEEK IMMEDIATE MEDICAL CARE IF:  You have persistent pain.  You have chest pain.  You have shortness of breath.  You faint.  You have leg pain.  You have stomach pain.  Your vaginal bleeding saturates 2 or more sanitary pads in 1 hour. MAKE SURE YOU:   Understand these instructions.  Will watch your condition.  Will get help right  away if you are not doing well or get worse. Document Released: 10/14/2001 Document Revised: 06/08/2013 Document Reviewed: 09/19/2011 [facility] Patient Information 2015 [place], [place]. This information is not intended to replace advice given to you by your health care provider. Make sure you discuss any questions you have with your health care provider.

## 2014-03-12 ENCOUNTER — Inpatient Hospital Stay (HOSPITAL_COMMUNITY): Payer: 59

## 2014-03-12 ENCOUNTER — Inpatient Hospital Stay (HOSPITAL_COMMUNITY)
Admission: AD | Admit: 2014-03-12 | Discharge: 2014-03-14 | DRG: 776 | Disposition: A | Discharge status: Home / Self Care | Payer: 59 | Source: Ambulatory Visit | Attending: Family Medicine | Admitting: Family Medicine

## 2014-03-12 ENCOUNTER — Encounter (HOSPITAL_COMMUNITY): Payer: Self-pay

## 2014-03-12 DIAGNOSIS — N719 Inflammatory disease of uterus, unspecified: Secondary | ICD-10-CM | POA: Diagnosis present

## 2014-03-12 DIAGNOSIS — O8612 Endometritis following delivery: Secondary | ICD-10-CM | POA: Diagnosis present

## 2014-03-12 DIAGNOSIS — Z87891 Personal history of nicotine dependence: Secondary | ICD-10-CM | POA: Diagnosis not present

## 2014-03-12 DIAGNOSIS — O864 Pyrexia of unknown origin following delivery: Secondary | ICD-10-CM | POA: Diagnosis present

## 2014-03-12 LAB — CBC WITH DIFFERENTIAL/PLATELET
BASOS PCT: 0 % (ref 0–1)
Basophils Absolute: 0 K/uL (ref 0.0–0.1)
Basophils Absolute: 0 10*3/uL (ref 0.0–0.1)
Basophils Relative: 0 % (ref 0–1)
EOS ABS: 0.1 10*3/uL (ref 0.0–0.7)
Eosinophils Absolute: 0 K/uL (ref 0.0–0.7)
Eosinophils Relative: 0 % (ref 0–5)
Eosinophils Relative: 1 % (ref 0–5)
HCT: 27.4 % — ABNORMAL LOW (ref 36.0–46.0)
HEMATOCRIT: 26.1 % — AB (ref 36.0–46.0)
HEMOGLOBIN: 8.6 g/dL — AB (ref 12.0–15.0)
Hemoglobin: 8.8 g/dL — ABNORMAL LOW (ref 12.0–15.0)
Lymphocytes Relative: 7 % — ABNORMAL LOW (ref 12–46)
Lymphocytes Relative: 10 % — ABNORMAL LOW (ref 12–46)
Lymphs Abs: 0.8 K/uL (ref 0.7–4.0)
Lymphs Abs: 1.4 10*3/uL (ref 0.7–4.0)
MCH: 23.8 pg — ABNORMAL LOW (ref 26.0–34.0)
MCH: 24.5 pg — ABNORMAL LOW (ref 26.0–34.0)
MCHC: 32.1 g/dL (ref 30.0–36.0)
MCHC: 33 g/dL (ref 30.0–36.0)
MCV: 74.3 fL — ABNORMAL LOW (ref 78.0–100.0)
MCV: 74.4 fL — ABNORMAL LOW (ref 78.0–100.0)
Monocytes Absolute: 0.8 K/uL (ref 0.1–1.0)
Monocytes Absolute: 1.2 10*3/uL — ABNORMAL HIGH (ref 0.1–1.0)
Monocytes Relative: 6 % (ref 3–12)
Monocytes Relative: 9 % (ref 3–12)
NEUTROS ABS: 11.1 10*3/uL — AB (ref 1.7–7.7)
NEUTROS PCT: 80 % — AB (ref 43–77)
Neutro Abs: 11 K/uL — ABNORMAL HIGH (ref 1.7–7.7)
Neutrophils Relative %: 87 % — ABNORMAL HIGH (ref 43–77)
PLATELETS: 224 10*3/uL (ref 150–400)
Platelets: 204 K/uL (ref 150–400)
RBC: 3.69 MIL/uL — ABNORMAL LOW (ref 3.87–5.11)
RBC: 3.51 MIL/uL — ABNORMAL LOW (ref 3.87–5.11)
RDW: 17 % — ABNORMAL HIGH (ref 11.5–15.5)
RDW: 16.9 % — ABNORMAL HIGH (ref 11.5–15.5)
WBC: 12.6 K/uL — ABNORMAL HIGH (ref 4.0–10.5)
WBC: 13.8 10*3/uL — AB (ref 4.0–10.5)

## 2014-03-12 LAB — URINALYSIS, ROUTINE W REFLEX MICROSCOPIC
Bilirubin Urine: NEGATIVE
Glucose, UA: NEGATIVE mg/dL
KETONES UR: NEGATIVE mg/dL
Nitrite: NEGATIVE
PH: 6 (ref 5.0–8.0)
Protein, ur: NEGATIVE mg/dL
SPECIFIC GRAVITY, URINE: 1.015 (ref 1.005–1.030)
Urobilinogen, UA: 0.2 mg/dL (ref 0.0–1.0)

## 2014-03-12 LAB — URINE MICROSCOPIC-ADD ON

## 2014-03-12 MED ORDER — ACETAMINOPHEN 325 MG PO TABS
650.0000 mg | ORAL_TABLET | ORAL | Status: DC | PRN
  Administered 2014-03-12: 650 mg via ORAL
  Filled 2014-03-12: qty 2

## 2014-03-12 MED ORDER — IBUPROFEN 600 MG PO TABS
600.0000 mg | ORAL_TABLET | Freq: Four times a day (QID) | ORAL | Status: DC
Start: 2014-03-12 — End: 2014-03-14
  Administered 2014-03-12 – 2014-03-14 (×10): 600 mg via ORAL
  Filled 2014-03-12 (×10): qty 1

## 2014-03-12 MED ORDER — LACTATED RINGERS IV BOLUS (SEPSIS)
1000.0000 mL | Freq: Once | INTRAVENOUS | Status: AC
  Administered 2014-03-12: 1000 mL via INTRAVENOUS

## 2014-03-12 MED ORDER — LORAZEPAM 1 MG PO TABS: 1.0000 mg | ORAL_TABLET | Freq: Four times a day (QID) | ORAL | Status: DC | PRN

## 2014-03-12 MED ORDER — ONDANSETRON HCL 4 MG/2ML IJ SOLN: 4.0000 mg | Freq: Four times a day (QID) | INTRAMUSCULAR | Status: DC | PRN

## 2014-03-12 MED ORDER — DEXTROSE 5 % IV SOLN
2.0000 g | Freq: Two times a day (BID) | INTRAVENOUS | Status: DC
  Administered 2014-03-12 – 2014-03-14 (×5): 2 g via INTRAVENOUS
  Filled 2014-03-12 (×6): qty 2

## 2014-03-12 MED ORDER — SODIUM CHLORIDE 0.9 % IV SOLN
INTRAVENOUS | Status: DC
  Administered 2014-03-12 – 2014-03-13 (×4): via INTRAVENOUS

## 2014-03-12 MED ORDER — OXYCODONE-ACETAMINOPHEN 5-325 MG PO TABS
1.0000 | ORAL_TABLET | ORAL | Status: DC | PRN
  Administered 2014-03-12: 1 via ORAL
  Administered 2014-03-12: 2 via ORAL
  Administered 2014-03-12 (×2): 1 via ORAL
  Administered 2014-03-13 – 2014-03-14 (×4): 2 via ORAL
  Administered 2014-03-14: 1 via ORAL
  Administered 2014-03-14: 2 via ORAL
  Filled 2014-03-12 (×3): qty 2
  Filled 2014-03-12: qty 1
  Filled 2014-03-12 (×3): qty 2
  Filled 2014-03-12 (×2): qty 1
  Filled 2014-03-12: qty 2

## 2014-03-12 MED ORDER — HYDROMORPHONE HCL 1 MG/ML IJ SOLN
1.0000 mg | INTRAMUSCULAR | Status: AC
  Administered 2014-03-12: 1 mg via INTRAVENOUS
  Filled 2014-03-12: qty 1

## 2014-03-12 MED ORDER — PRENATAL MULTIVITAMIN CH
1.0000 | ORAL_TABLET | Freq: Every day | ORAL | Status: DC
  Administered 2014-03-12 – 2014-03-14 (×3): 1 via ORAL
  Filled 2014-03-12 (×3): qty 1

## 2014-03-12 MED ORDER — DOCUSATE SODIUM 100 MG PO CAPS
100.0000 mg | ORAL_CAPSULE | Freq: Two times a day (BID) | ORAL | Status: DC
  Administered 2014-03-12 – 2014-03-14 (×5): 100 mg via ORAL
  Filled 2014-03-12 (×5): qty 1

## 2014-03-12 MED ORDER — HYDROMORPHONE HCL 1 MG/ML IJ SOLN: 1.0000 mg | INTRAMUSCULAR | Status: DC | PRN

## 2014-03-12 MED ORDER — ONDANSETRON HCL 4 MG PO TABS: 4.0000 mg | ORAL_TABLET | Freq: Four times a day (QID) | ORAL | Status: DC | PRN

## 2014-03-12 MED ORDER — PRENATAL MULTIVITAMIN CH: 1.0000 | ORAL_TABLET | Freq: Every day | ORAL | Status: DC

## 2014-03-12 NOTE — Progress Notes (Signed)
Notified Resident on call that pt was unable to complete transabdominal U/S due to pain at site of C section. No new orders received. Marry Guan

## 2014-03-12 NOTE — Progress Notes (Signed)
Patient is crying, holding her baby. Stated she would like to have her pain med when it is due and then sign papers to leave AMA. Dr Roselie Awkward was called and notified. House supervisor notified.

## 2014-03-12 NOTE — Progress Notes (Signed)
Patient much calmer. Her father has arrived from [place]. She has decided to stay, her father will spend the night. Dr Roselie Awkward notified.

## 2014-03-12 NOTE — Progress Notes (Signed)
Pt is very upset at this time. She has previously told me about disagreement between her and the FOB. FOB came to visit at her request. The 2 talked in the hallway and as he was leaving she began to yell for help for staff to stop him. I informed her that we could not keep him from leaving. Pt began to cry hysterically and was extremely concerned that FOB and his family are trying to have her baby taken from her on the basis of a hx of mental illness, which she confirms is bipolar disease that she is not taking medication for. Pt says that she has communicated with her physician and and that she states she would rather not take meds if it is an option. Dr. Roselie Awkward came to evaluate patient and explained that if she leaves it would be AMA. Marry Guan

## 2014-03-12 NOTE — Progress Notes (Addendum)
Notified Dr. Roselie Awkward that pt was unable to complete transabdominal U/S earlier today due to pain at site of C/S. Updated on pts condition, no new orders currently. Marry Guan

## 2014-03-12 NOTE — MAU Provider Note (Signed)
Chief Complaint: Postpartum Complications and Fever   First Provider Initiated Contact with Patient 03/12/14 0118     SUBJECTIVE HPI: Tasha Barrett is a 24 y.o. G1P1001 POD #3 following primary LTCS for chorio, fetal distress, and failure to progress who presents to maternity admissions via EMS reporting abdominal pain at incision site and fever/chills beginning at 8 pm last night. She was discharged from the hospital yesterday morning and felt well with some mild abdominal pain but the pain worsened over the course of the day, then fever/chills started in the evening.  She is unable to change positions in bed for exam related to pain when first arrived in MAU.  She reports feeling dizzy and delirious.  She reports normal lochia denies vaginal itching/burning, urinary symptoms, h/a, or n/v.  Pt is breastfeeding and infant does have poor latch but breasts are not painful except sometimes when her baby is latched and feeding.     Past Medical History  Diagnosis Date  . Bipolar 2 disorder   . Pneumonia     age 42   . Bipolar disorder   . Depression   . Anxiety     Used Latuda (did not work)  . Yeast infection    Past Surgical History  Procedure Laterality Date  . Cesarean section N/A 03/09/2014    Procedure: CESAREAN SECTION;  Surgeon: Donnamae Jude, MD;  Location: [facility];  Service: Obstetrics;  Laterality: N/A;   History   Social History  . Marital Status: Single    Spouse Name: N/A    Number of Children: N/A  . Years of Education: N/A   Occupational History  . employed    Social History Main Topics  . Smoking status: Former Smoker    Types: Cigarettes  . Smokeless tobacco: Never Used  . Alcohol Use: No  . Drug Use: No     Comment: jan 2013 X 1 only  . Sexual Activity:    Partners: Male   Other Topics Concern  . Not on file   Social History Narrative   No current facility-administered medications on file prior to encounter.   Current Outpatient Prescriptions on File  Prior to Encounter  Medication Sig Dispense Refill  . ibuprofen (ADVIL,MOTRIN) 600 MG tablet Take 1 tablet (600 mg total) by mouth every 6 (six) hours. 50 tablet 1  . Iron-FA-B Cmp-C-Biot-Probiotic (FUSION PLUS) CAPS Take 1 capsule by mouth daily. 30 capsule 6  . oxyCODONE-acetaminophen (PERCOCET/ROXICET) 5-325 MG per tablet Take 1-2 tablets by mouth every 4 (four) hours as needed (for pain scale less than 7). 50 tablet 0  . Prenatal Multivit-Min-Fe-FA (PRENATAL VITAMINS PO) Take 1 tablet by mouth daily.      Allergies  Allergen Reactions  . Amoxicillin-Pot Clavulanate Other (See Comments)    Reaction childhood  . Latex     Possible yeast infection.    ROS: Pertinent items in HPI  OBJECTIVE Blood pressure 127/63, pulse 141, temperature 102.5 F (39.2 C), temperature source Oral, resp. rate 24, last menstrual period 05/27/2013, SpO2 99 %, unknown if currently breastfeeding. GENERAL: Well-developed, well-nourished female in no acute distress.  HEENT: Normocephalic HEART: normal rate RESP: normal effort ABDOMEN: Soft, generalized tenderness, warm to touch, incision with honeycomb dressing in place, some light bleeding on dressing but not soaked EXTREMITIES: Nontender, no edema NEURO: Alert and oriented   LAB RESULTS Results for orders placed or performed during the hospital encounter of 03/12/14 (from the past 24 hour(s))  CBC with Differential  Status: Abnormal   Collection Time: 03/12/14  1:55 AM  Result Value Ref Range   WBC 12.6 (H) 4.0 - 10.5 K/uL   RBC 3.69 (L) 3.87 - 5.11 MIL/uL   Hemoglobin 8.8 (L) 12.0 - 15.0 g/dL   HCT 27.4 (L) 36.0 - 46.0 %   MCV 74.3 (L) 78.0 - 100.0 fL   MCH 23.8 (L) 26.0 - 34.0 pg   MCHC 32.1 30.0 - 36.0 g/dL   RDW 17.0 (H) 11.5 - 15.5 %   Platelets 204 150 - 400 K/uL   Neutrophils Relative % 87 (H) 43 - 77 %   Neutro Abs 11.0 (H) 1.7 - 7.7 K/uL   Lymphocytes Relative 7 (L) 12 - 46 %   Lymphs Abs 0.8 0.7 - 4.0 K/uL   Monocytes Relative 6 3  - 12 %   Monocytes Absolute 0.8 0.1 - 1.0 K/uL   Eosinophils Relative 0 0 - 5 %   Eosinophils Absolute 0.0 0.0 - 0.7 K/uL   Basophils Relative 0 0 - 1 %   Basophils Absolute 0.0 0.0 - 0.1 K/uL     ASSESSMENT 1. Postpartum endometritis     PLAN Consult Dr Nehemiah Settle Dr Nehemiah Settle to bedside to evaluate pt Admit to [facility] for IV abx Cefotetan 2g IV Q 12 hours Transvaginal U/S Blood cultures, U/A  pending     Medication List    ASK your doctor about these medications        FUSION PLUS Caps  Take 1 capsule by mouth daily.     ibuprofen 600 MG tablet  Commonly known as:  ADVIL,MOTRIN  Take 1 tablet (600 mg total) by mouth every 6 (six) hours.     oxyCODONE-acetaminophen 5-325 MG per tablet  Commonly known as:  PERCOCET/ROXICET  Take 1-2 tablets by mouth every 4 (four) hours as needed (for pain scale less than 7).     PRENATAL VITAMINS PO  Take 1 tablet by mouth daily.         Fatima Blank Certified Nurse-Midwife 03/12/2014  2:38 AM

## 2014-03-12 NOTE — Progress Notes (Signed)
Patient is upset after an argument with the FOB. Talking about leaving AMA to go to [place] with her father. I advised her to stay for ABX therapy until afebrile for 48 hr. Ativan prn anxiety.  Woodroe Mode, MD 03/12/2014 5:47 PM

## 2014-03-12 NOTE — H&P (Signed)
Chief Complaint: Postpartum Complications and Fever  First Provider Initiated Contact with Patient 03/12/14 0118       SUBJECTIVE HPI: Tasha Barrett is a 24 y.o. G1P1001 POD #3 following primary LTCS for chorio, fetal distress, and failure to progress who presents to maternity admissions via EMS reporting abdominal pain at incision site and fever/chills beginning at 8 pm last night. She was discharged from the hospital yesterday morning and felt well with some mild abdominal pain but the pain worsened over the course of the day, then fever/chills started in the evening.  She is unable to change positions in bed for exam related to pain when first arrived in MAU.  She reports feeling dizzy and delirious.  She reports normal lochia denies vaginal itching/burning, urinary symptoms, h/a, or n/v.  Pt is breastfeeding and infant does have poor latch but breasts are not painful except sometimes when her baby is latched and feeding.        Past Medical History   Diagnosis  Date   .  Bipolar 2 disorder     .  Pneumonia         age 39    .  Bipolar disorder     .  Depression     .  Anxiety         Used Latuda (did not work)   .  Yeast infection      Past Surgical History   Procedure  Laterality  Date   .  Cesarean section  N/A  03/09/2014       Procedure: CESAREAN SECTION;  Surgeon: Donnamae Jude, MD;  Location: [facility];  Service: Obstetrics;  Laterality: N/A;    History       Social History   .  Marital Status:  Single       Spouse Name:  N/A       Number of Children:  N/A   .  Years of Education:  N/A       Occupational History   .  employed         Social History Main Topics   .  Smoking status:  Former Smoker       Types:  Cigarettes   .  Smokeless tobacco:  Never Used   .  Alcohol Use:  No   .  Drug Use:  No         Comment: jan 2013 X 1 only   .  Sexual Activity:       Partners:  Male       Other Topics  Concern   .  Not on file       Social History Narrative    No  current facility-administered medications on file prior to encounter.       Current Outpatient Prescriptions on File Prior to Encounter   Medication  Sig  Dispense  Refill   .  ibuprofen (ADVIL,MOTRIN) 600 MG tablet  Take 1 tablet (600 mg total) by mouth every 6 (six) hours.  50 tablet  1   .  Iron-FA-B Cmp-C-Biot-Probiotic (FUSION PLUS) CAPS  Take 1 capsule by mouth daily.  30 capsule  6   .  oxyCODONE-acetaminophen (PERCOCET/ROXICET) 5-325 MG per tablet  Take 1-2 tablets by mouth every 4 (four) hours as needed (for pain scale less than 7).  50 tablet  0   .  Prenatal Multivit-Min-Fe-FA (PRENATAL VITAMINS PO)  Take 1 tablet by mouth daily.  Allergies   Allergen  Reactions   .  Amoxicillin-Pot Clavulanate  Other (See Comments)       Reaction childhood   .  Latex         Possible yeast infection.      ROS: Pertinent items in HPI   OBJECTIVE Blood pressure 127/63, pulse 141, temperature 102.5 F (39.2 C), temperature source Oral, resp. rate 24, last menstrual period 05/27/2013, SpO2 99 %, unknown if currently breastfeeding. GENERAL: Well-developed, well-nourished female in no acute distress.   HEENT: Normocephalic HEART: normal rate, normal S1, S2 sounds.  No murmurs. RESP: normal effort, lungs clear to osculation.  No wheezes, rales, rhonchi.   ABDOMEN: Soft, generalized tenderness to gentle palpation, very warm to touch, incision clean, dry, intact.  No drainage from incision.   EXTREMITIES: Nontender, no edema NEURO: Alert and oriented     LAB RESULTS  Lab Results Last 24 Hours  Results for orders placed or performed during the hospital encounter of 03/12/14 (from the past 24 hour(s))   CBC with Differential     Status: Abnormal     Collection Time: 03/12/14  1:55 AM   Result  Value  Ref Range     WBC  12.6 (H)  4.0 - 10.5 K/uL     RBC  3.69 (L)  3.87 - 5.11 MIL/uL     Hemoglobin  8.8 (L)  12.0 - 15.0 g/dL     HCT  27.4 (L)  36.0 - 46.0 %     MCV  74.3 (L)  78.0  - 100.0 fL     MCH  23.8 (L)  26.0 - 34.0 pg     MCHC  32.1  30.0 - 36.0 g/dL     RDW  17.0 (H)  11.5 - 15.5 %     Platelets  204  150 - 400 K/uL     Neutrophils Relative %  87 (H)  43 - 77 %     Neutro Abs  11.0 (H)  1.7 - 7.7 K/uL     Lymphocytes Relative  7 (L)  12 - 46 %     Lymphs Abs  0.8  0.7 - 4.0 K/uL     Monocytes Relative  6  3 - 12 %     Monocytes Absolute  0.8  0.1 - 1.0 K/uL     Eosinophils Relative  0  0 - 5 %     Eosinophils Absolute  0.0  0.0 - 0.7 K/uL     Basophils Relative  0  0 - 1 %     Basophils Absolute  0.0  0.0 - 0.1 K/uL          ASSESSMENT 1.  Postpartum endometritis      PLAN Admit Cefotetan 2g q12 IVF Tylenol, ibuprofen for pain Pelvic US Lactation support.   Truett Mainland, DO 03/12/2014 2:45 AM

## 2014-03-12 NOTE — Consult Note (Signed)
Mom readmitted less than 24 hours after being discharged to home after delivering her baby. Mom presented with abdominal pain and fever, but is doing much better today. It is mom's choice to formula  and breast feed. She has flat nipples, but has been doing well with a 24 nipple shield. Mom is slightly engorged in her right breast, so I had mom apply ice, and set up a DEP. She was able to express about 45 mls of transitional milk ( baby born on 5/2), and her breast felt much softer. Mom hope to provide EBm exclusively for her baby, by both bottle and breast feeding. Mom knows to call for questions/concerns.

## 2014-03-12 NOTE — MAU Note (Signed)
C/section on Tuesday for chorio. Fever & chills tonight, started at 8 pm. Dizziness and abdominal pain.

## 2014-03-13 NOTE — Plan of Care (Signed)
Problem: Discharge Progression Outcomes Goal: Pain controlled with appropriate interventions Outcome: Completed/Met Date Met:  03/13/14 Good pain control on po Percocet and Motrin. Goal: Hemodynamically stable Outcome: Completed/Met Date Met:  03/13/14 VSS and afebrile at this time.

## 2014-03-13 NOTE — Plan of Care (Signed)
Problem: Phase II Progression Outcomes Goal: Other Phase II Outcomes/Goals Outcome: Completed/Met Date Met:  03/13/14 Has been afebrile for more than 24 hours.

## 2014-03-13 NOTE — Progress Notes (Signed)
Subjective: Postpartum Day 4: Cesarean Delivery Patient reports incisional pain and tolerating PO.    Objective: Vital signs in last 24 hours: Temp:  [97.5 F (36.4 C)-99.2 F (37.3 C)] 97.5 F (36.4 C) (02/06 0542) Pulse Rate:  [94-135] 94 (02/06 0542) Resp:  [18] 18 (02/06 0542) BP: (119-147)/(45-81) 131/81 mmHg (02/06 0542) SpO2:  [98 %-100 %] 100 % (02/06 0542)  Physical Exam:  General: alert, cooperative and no distress Lochia: appropriate Uterine Fundus: tender Incision: healing well, no significant drainage DVT Evaluation: No evidence of DVT seen on physical exam.   Recent Labs  03/12/14 0155 03/12/14 0525  HGB 8.8* 8.6*  HCT 27.4* 26.1*    Assessment/Plan: Status post Cesarean section. Postoperative course complicated by endometritis  Continue current care.  Nolyn Eilert 03/13/2014, 7:29 AM

## 2014-03-14 DIAGNOSIS — O8612 Endometritis following delivery: Principal | ICD-10-CM

## 2014-03-14 MED ORDER — CLINDAMYCIN HCL 300 MG PO CAPS: 600.0000 mg | ORAL_CAPSULE | Freq: Two times a day (BID) | ORAL | Status: DC

## 2014-03-14 MED ORDER — CEFADROXIL 500 MG PO CAPS: 500.0000 mg | ORAL_CAPSULE | Freq: Two times a day (BID) | ORAL | Status: DC
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
## 2014-03-14 NOTE — Lactation Note (Signed)
This note was copied from the chart of [place]. Lactation Consultation Note: follow up visit with this readmitted mom. She reports she has not pumped or nursed since Friday. Reports breasts are full and uncomfortable. Is leaking now. Offered assist with pumping and mom agreeable. Mom needed much help with getting positioned and to get flanges on correctly due to large breasts. Mom pumped for 20 min and obtained about 60 cc's  Pleased she had gotten so much. Reviewed supply and demand and encouraged to nurse or pump q 3 hours to promote milk supply. Mom states she is going to bottle fed EBM some and encouraged to continue pumping. No further questions at present. To call prn  Patient Name: Sharyn Blitz ZLDJT'T Date: 03/14/2014     Maternal Data    Feeding    LATCH Score/Interventions                      Lactation Tools Discussed/Used     Consult Status      Truddie Crumble 03/14/2014, 9:35 AM
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
## 2014-03-14 NOTE — Progress Notes (Signed)
Discharge teaching complete. Pt understood all information and did not have any questions. Pt ambulated out of the hospital and discharged home to family.  

## 2014-03-14 NOTE — Progress Notes (Signed)
Revisited parents.  Informed that things went well last evening, and they worked on a plan for the next two weeks.  Both parents felt comfortable with the plan and agreed to commit to it.  They comment on the improvement they noticed with their communication.  Reiterated the importance of continued counseling and they both agreed and verbalize committed to follow through with individual and couple's counseling.  There was a noticeable decrease in tension in the room as well as an improvement in the way they related to each other.   Mother also remained calm during the discussion.  No further CSW intervention needed at this time.

## 2014-03-17 ENCOUNTER — Inpatient Hospital Stay (HOSPITAL_COMMUNITY)
Admission: AD | Admit: 2014-03-17 | Discharge: 2014-03-17 | Disposition: A | Discharge status: Home / Self Care | Payer: 59 | Source: Ambulatory Visit | Attending: Obstetrics & Gynecology | Admitting: Obstetrics & Gynecology

## 2014-03-17 ENCOUNTER — Encounter (HOSPITAL_COMMUNITY): Payer: Self-pay | Admitting: *Deleted

## 2014-03-17 DIAGNOSIS — O9089 Other complications of the puerperium, not elsewhere classified: Secondary | ICD-10-CM | POA: Diagnosis not present

## 2014-03-17 DIAGNOSIS — R109 Unspecified abdominal pain: Secondary | ICD-10-CM | POA: Insufficient documentation

## 2014-03-17 DIAGNOSIS — Z87891 Personal history of nicotine dependence: Secondary | ICD-10-CM | POA: Insufficient documentation

## 2014-03-17 DIAGNOSIS — Z09 Encounter for follow-up examination after completed treatment for conditions other than malignant neoplasm: Secondary | ICD-10-CM

## 2014-03-17 DIAGNOSIS — R0602 Shortness of breath: Secondary | ICD-10-CM | POA: Insufficient documentation

## 2014-03-17 LAB — CBC WITH DIFFERENTIAL/PLATELET
Basophils Absolute: 0 10*3/uL (ref 0.0–0.1)
Basophils Relative: 0 % (ref 0–1)
EOS PCT: 2 % (ref 0–5)
Eosinophils Absolute: 0.2 10*3/uL (ref 0.0–0.7)
HCT: 24.2 % — ABNORMAL LOW (ref 36.0–46.0)
HEMOGLOBIN: 7.7 g/dL — AB (ref 12.0–15.0)
LYMPHS ABS: 1.6 10*3/uL (ref 0.7–4.0)
Lymphocytes Relative: 21 % (ref 12–46)
MCH: 23.8 pg — ABNORMAL LOW (ref 26.0–34.0)
MCHC: 31.8 g/dL (ref 30.0–36.0)
MCV: 74.9 fL — AB (ref 78.0–100.0)
MONO ABS: 0.6 10*3/uL (ref 0.1–1.0)
Monocytes Relative: 8 % (ref 3–12)
Neutro Abs: 5.4 10*3/uL (ref 1.7–7.7)
Neutrophils Relative %: 69 % (ref 43–77)
Platelets: 300 10*3/uL (ref 150–400)
RBC: 3.23 MIL/uL — AB (ref 3.87–5.11)
RDW: 17.6 % — ABNORMAL HIGH (ref 11.5–15.5)
WBC: 7.9 10*3/uL (ref 4.0–10.5)

## 2014-03-17 LAB — BASIC METABOLIC PANEL
Anion gap: 4 — ABNORMAL LOW (ref 5–15)
BUN: 9 mg/dL (ref 6–23)
CO2: 24 mmol/L (ref 19–32)
CREATININE: 0.77 mg/dL (ref 0.50–1.10)
Calcium: 8.1 mg/dL — ABNORMAL LOW (ref 8.4–10.5)
Chloride: 112 mmol/L (ref 96–112)
GFR calc non Af Amer: >90 mL/min (ref >90)
Glucose, Bld: 103 mg/dL — ABNORMAL HIGH (ref 70–99)
Potassium: 4 mmol/L (ref 3.5–5.1)
Sodium: 140 mmol/L (ref 135–145)

## 2014-03-17 NOTE — MAU Provider Note (Signed)
CSN: [id number]     Arrival date & time 03/17/14  2059 History   None    Chief Complaint  Patient presents with  . Post-op Problem  . Shortness of Breath     (Consider location/radiation/quality/duration/timing/severity/associated sxs/prior Treatment) HPI Tasha Barrett is a 24 y.o. female who presents to MAU with abdominal pain and an area of swelling and tenderness. She is 8 days s/p C/S. She has returned once since her delivery and was admitted with endometritis. She is currently on antibiotics but over the past 24 hours has developed her current symptoms.  Past Medical History  Diagnosis Date  . Bipolar 2 disorder   . Pneumonia     age 77   . Bipolar disorder   . Depression   . Anxiety     Used Latuda (did not work)  . Yeast infection    Past Surgical History  Procedure Laterality Date  . Cesarean section N/A 03/09/2014    Procedure: CESAREAN SECTION;  Surgeon: Donnamae Jude, MD;  Location: [facility];  Service: Obstetrics;  Laterality: N/A;   Family History  Problem Relation Age of Onset  . Anesthesia problems Neg Hx   . Hypotension Neg Hx   . Malignant hyperthermia Neg Hx   . Pseudochol deficiency Neg Hx   . Depression Mother   . Gout Father   . Gout Paternal Uncle   . Gout Paternal Grandfather    History  Substance Use Topics  . Smoking status: Former Smoker    Types: Cigarettes  . Smokeless tobacco: Never Used  . Alcohol Use: No   OB History    Gravida Para Term Preterm AB TAB SAB Ectopic Multiple Living   1 1 1       0 1     Review of Systems Negative except as stated in HPI   Allergies  Amoxicillin-pot clavulanate and Latex  Home Medications   Prior to Admission medications   Medication Sig Start Date End Date Taking? Authorizing Provider  cefadroxil (DURICEF) 500 MG capsule Take 1 capsule (500 mg total) by mouth 2 (two) times daily. 03/14/14  Yes Osborne Oman, MD  clindamycin (CLEOCIN) 300 MG capsule Take 2 capsules (600 mg total) by mouth 2  (two) times daily. For seven days 03/14/14  Yes Osborne Oman, MD  ibuprofen (ADVIL,MOTRIN) 600 MG tablet Take 1 tablet (600 mg total) by mouth every 6 (six) hours. 03/11/14  Yes Myrtis Ser, CNM  oxyCODONE-acetaminophen (PERCOCET/ROXICET) 5-325 MG per tablet Take 1-2 tablets by mouth every 4 (four) hours as needed (for pain scale less than 7). 03/11/14  Yes Myrtis Ser, CNM  Prenatal Multivit-Min-Fe-FA (PRENATAL VITAMINS PO) Take 1 tablet by mouth daily.    Yes Historical Provider, MD  Iron-FA-B Cmp-C-Biot-Probiotic (FUSION PLUS) CAPS Take 1 capsule by mouth daily. Patient not taking: Reported on 03/17/2014 01/06/14   Emily Filbert, MD   BP 109/64 mmHg  Pulse 89  Temp(Src) 98 F (36.7 C) (Oral)  Resp 18  SpO2 99%  Breastfeeding? Yes Physical Exam  Constitutional: She is oriented to person, place, and time. She appears well-developed and well-nourished.  HENT:  Head: Normocephalic and atraumatic.  Eyes: Conjunctivae and EOM are normal.  Neck: Neck supple.  Cardiovascular: Normal rate.   Pulmonary/Chest: Effort normal.  Abdominal: There is tenderness.  There is a raised, tender, firm area just below the umbilicus. No erythema or red streaking noted.   Musculoskeletal: Normal range of motion.  Neurological: She is alert  and oriented to person, place, and time. No cranial nerve deficit.  Skin: Skin is warm and dry.  Psychiatric: She has a normal mood and affect. Her behavior is normal.  Nursing note and vitals reviewed.   ED Course  Procedures (including critical care time) Dr. Elonda Husky in to examine the patient and discussed clinical findings and plan of care with the patient. He does not feel that the area is infected and she does not need additional antibiotics.   MDM  24 y.o. female with abdominal pain stable for d/c without abscess or cellulitis. Discussed with the patient and all questioned fully answered. She will return if any problems arise.  Final diagnoses:  Follow-up  examination following surgery

## 2014-03-17 NOTE — MAU Note (Signed)
Pt states that her incision was tender and that there was a possibility of cellulitis of the incision. Pt was discharged on Feb 7th after being seen for incisional pain, fever and drainage. Pt had her caesarean section on Feb. 2.

## 2014-03-17 NOTE — Discharge Instructions (Signed)
Dr. Elonda Husky has examined you tonight and there is no additional infection noted. Keep your follow up appointment as scheduled.

## 2014-03-17 NOTE — MAU Note (Signed)
Dr. Elonda Barrett in to examine and assess incisional area. Patient has lymphatic vessels that are being compressed and not infection. Incision is looking good according to Dr. Elonda Barrett.

## 2014-03-18 LAB — CULTURE, BLOOD (ROUTINE X 2)
CULTURE: NO GROWTH
Culture: NO GROWTH
Special Requests: 10

## 2014-03-24 ENCOUNTER — Ambulatory Visit (INDEPENDENT_AMBULATORY_CARE_PROVIDER_SITE_OTHER): Payer: 59 | Admitting: Obstetrics & Gynecology

## 2014-03-24 ENCOUNTER — Encounter: Payer: Self-pay | Admitting: Obstetrics & Gynecology

## 2014-03-24 VITALS — BP 128/78 | HR 78 | Temp 98.2°F | Resp 16 | Ht 63.0 in | Wt 204.0 lb

## 2014-03-24 DIAGNOSIS — O8612 Endometritis following delivery: Secondary | ICD-10-CM

## 2014-03-24 DIAGNOSIS — E669 Obesity, unspecified: Secondary | ICD-10-CM

## 2014-03-24 NOTE — Progress Notes (Signed)
   Subjective:    Patient ID: Tasha Barrett, female    DOB: 24-Feb-1990, 24 y.o.   MRN: [id number]  HPI  She is here today for a follow up after treatment for endometritis. She feels like she is recovering. She has finished her abx. Denies fevers. No sex yet. She is pumping. Baby is doing well. She denies an depression symptoms. Review of Systems     Objective:   Physical Exam  Beautifully healed incision Abd- obese, benign Bimanual exam reveals a normal, involuting, NT normal post partum uterus      Assessment & Plan:  S/p C/s and treatment for endometritis- doing well RTC 4 weeks for pp exam

## 2014-03-25 ENCOUNTER — Ambulatory Visit: Payer: 59 | Admitting: Obstetrics & Gynecology

## 2014-04-21 ENCOUNTER — Ambulatory Visit: Payer: 59 | Admitting: Obstetrics & Gynecology

## 2014-04-28 ENCOUNTER — Encounter: Payer: Self-pay | Admitting: Obstetrics & Gynecology

## 2014-04-28 ENCOUNTER — Ambulatory Visit (INDEPENDENT_AMBULATORY_CARE_PROVIDER_SITE_OTHER): Payer: 59 | Admitting: Obstetrics & Gynecology

## 2014-04-28 NOTE — Progress Notes (Signed)
  Subjective:     Tasha Barrett is a 24 y.o. female who presents for a postpartum visit. She is 6 weeks postpartum following a low cervical transverse Cesarean section. I have fully reviewed the prenatal and intrapartum course. The delivery was at term gestational weeks. Outcome: primary cesarean section, low transverse incision. Anesthesia: epidural. Postpartum course has been normal. Baby's course has been normal. Baby is feeding by breast. Bleeding no bleeding. Bowel function is normal. Bladder function is normal. Patient is not sexually active. Contraception method is condoms. Postpartum depression screening: negative.  The following portions of the patient's history were reviewed and updated as appropriate: allergies, current medications, past family history, past medical history, past social history, past surgical history and problem list.  Review of Systems Pertinent items are noted in HPI.   Objective:    BP 108/66 mmHg  Pulse 87  Resp 16  Ht 5\' 2"  (1.575 m)  Wt 195 lb (88.451 kg)  BMI 35.66 kg/m2  Breastfeeding? Yes  General:  alert   Breasts:  inspection negative, no nipple discharge or bleeding, no masses or nodularity palpable  Lungs: clear to auscultation bilaterally  Heart:  regular rate and rhythm, S1, S2 normal, no murmur, click, rub or gallop  Abdomen: soft, non-tender; bowel sounds normal; no masses,  no organomegaly   Vulva:  not evaluated  Vagina: not evaluated  Cervix:  not evaluated  Corpus: not examined  Adnexa:  not evaluated  Rectal Exam: Not performed.       Incision- healed beautifully Assessment:     Normal postpartum exam. Pap smear not done at today's visit.   Plan:    1. Contraception: condoms  2. Follow up in: 1 year/prn sooner or as needed.

## 2014-05-21 ENCOUNTER — Ambulatory Visit (INDEPENDENT_AMBULATORY_CARE_PROVIDER_SITE_OTHER): Payer: 59 | Admitting: Family

## 2014-05-21 ENCOUNTER — Encounter: Payer: Self-pay | Admitting: Family

## 2014-05-21 VITALS — BP 122/74 | HR 86 | Resp 16 | Ht 62.0 in | Wt 194.0 lb

## 2014-05-21 DIAGNOSIS — Z3009 Encounter for other general counseling and advice on contraception: Secondary | ICD-10-CM

## 2014-05-21 NOTE — Progress Notes (Signed)
Subjective:     Patient ID: Tasha Barrett, female   DOB: 03/18/1990, 24 y.o.   MRN: [id number]  HPI Ms. Tasha Barrett is a 24 y.o. G1P1001 here for family planning initiation.  Upon review of record patient gave birth via csection on 03/09/14.  States has not tried any form of birth control in the past and desires Caya diaphragm. The Caya diaphragm is not found in the [place].  Patient plans to order from [place], however needs to have fitting to ensure within the range of use for the Caya.    Review of Systems  Constitutional: Negative for activity change.  Genitourinary: Negative for dysuria, frequency, vaginal bleeding and vaginal discharge.  Psychiatric/Behavioral: Negative for agitation.       Denies depression; symptoms have improved since birth of child.    All other systems reviewed and are negative.      Objective:   Physical Exam  Constitutional: She is oriented to person, place, and time. She appears well-developed and well-nourished. No distress.  HENT:  Head: Normocephalic and atraumatic.  Neck: Normal range of motion. Neck supple.  Cardiovascular: Normal rate and regular rhythm.   Pulmonary/Chest: Effort normal and breath sounds normal.  Abdominal: Soft. Bowel sounds are normal. There is no tenderness.  Genitourinary: Vagina normal. No vaginal discharge found.  Musculoskeletal: Normal range of motion. She exhibits no edema.  Neurological: She is alert and oriented to person, place, and time.  Skin: Skin is warm and dry.   Sized for diaphragm with size measuring 70 mm.      Assessment:     Family Planning Counseling     Plan:     Reviewed other forms of family planning, including IUD, OCPs, nexplanon, and nuvaring.  Pt opts for non hormonal method and will purchase Caya from [place].    Explained not familiar with effectiveness of the device and pregnancy may still occur.   Venia Carbon Michiel Cowboy, CNM

## 2014-09-16 ENCOUNTER — Encounter: Payer: Self-pay | Admitting: Obstetrics & Gynecology

## 2014-09-16 ENCOUNTER — Ambulatory Visit (INDEPENDENT_AMBULATORY_CARE_PROVIDER_SITE_OTHER): Payer: 59 | Admitting: Obstetrics & Gynecology

## 2014-09-16 VITALS — BP 114/64 | HR 80 | Resp 16 | Ht 63.0 in | Wt 202.0 lb

## 2014-09-16 DIAGNOSIS — M546 Pain in thoracic spine: Secondary | ICD-10-CM

## 2014-09-16 DIAGNOSIS — F319 Bipolar disorder, unspecified: Secondary | ICD-10-CM | POA: Diagnosis not present

## 2014-09-16 NOTE — Progress Notes (Signed)
   Subjective:    Patient ID: Tasha Barrett, female    DOB: 1991/01/18, 24 y.o.   MRN: [id number]  HPI  24 yo AA P35 (24 month old daughter) is here with 2 concerns.  1) She has bipolar disorder and has not gotten in with a psychiatrist. 2) a lot of back pain due to her large breasts. Her bra straps are actually leaving indentions in her shoulders.   Review of Systems     Objective:   Physical Exam  [id number] indentions in her shoulders from her bra straps Breathing, conversing, and ambulating normally     Assessment & Plan:  Back pain from large breasts- rec breast reduction Bipolar - rec psychiatrist

## 2014-09-17 ENCOUNTER — Ambulatory Visit (INDEPENDENT_AMBULATORY_CARE_PROVIDER_SITE_OTHER): Payer: 59 | Admitting: Licensed Clinical Social Worker

## 2014-09-17 DIAGNOSIS — F411 Generalized anxiety disorder: Secondary | ICD-10-CM

## 2014-09-17 DIAGNOSIS — F3162 Bipolar disorder, current episode mixed, moderate: Secondary | ICD-10-CM
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
## 2014-09-20 ENCOUNTER — Ambulatory Visit (INDEPENDENT_AMBULATORY_CARE_PROVIDER_SITE_OTHER): Payer: 59 | Admitting: Osteopathic Medicine

## 2014-09-20 ENCOUNTER — Encounter: Payer: Self-pay | Admitting: Osteopathic Medicine

## 2014-09-20 VITALS — BP 110/61 | HR 78 | Ht 62.0 in | Wt 203.0 lb

## 2014-09-20 DIAGNOSIS — Z111 Encounter for screening for respiratory tuberculosis: Secondary | ICD-10-CM | POA: Diagnosis not present

## 2014-09-20 DIAGNOSIS — N62 Hypertrophy of breast: Secondary | ICD-10-CM

## 2014-09-20 DIAGNOSIS — E669 Obesity, unspecified: Secondary | ICD-10-CM | POA: Diagnosis not present

## 2014-09-20 DIAGNOSIS — Z0000 Encounter for general adult medical examination without abnormal findings: Secondary | ICD-10-CM | POA: Diagnosis not present

## 2014-09-20 DIAGNOSIS — Z131 Encounter for screening for diabetes mellitus: Secondary | ICD-10-CM

## 2014-09-20 DIAGNOSIS — R5383 Other fatigue: Secondary | ICD-10-CM

## 2014-09-20 DIAGNOSIS — Z13220 Encounter for screening for lipoid disorders: Secondary | ICD-10-CM | POA: Diagnosis not present

## 2014-09-20 DIAGNOSIS — Z0100 Encounter for examination of eyes and vision without abnormal findings: Secondary | ICD-10-CM

## 2014-09-20 DIAGNOSIS — R011 Cardiac murmur, unspecified: Secondary | ICD-10-CM

## 2014-09-20 DIAGNOSIS — D649 Anemia, unspecified: Secondary | ICD-10-CM
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
## 2014-09-20 NOTE — Patient Instructions (Signed)
You should hear from our office about scheduling an echocardiogram to further evaluate your heart murmur, if you don't hear about this within one week, please call our clinic.    Calorie Counting for Weight Loss Calories are energy you get from the things you eat and drink. Your body uses this energy to keep you going throughout the day. The number of calories you eat affects your weight. When you eat more calories than your body needs, your body stores the extra calories as fat. When you eat fewer calories than your body needs, your body burns fat to get the energy it needs. Calorie counting means keeping track of how many calories you eat and drink each day. If you make sure to eat fewer calories than your body needs, you should lose weight. In order for calorie counting to work, you will need to eat the number of calories that are right for you in a day to lose a healthy amount of weight per week. A healthy amount of weight to lose per week is usually 1-2 lb (0.5-0.9 kg). A dietitian can determine how many calories you need in a day and give you suggestions on how to reach your calorie goal.  WHAT IS MY MY PLAN? My goal is to have __________ calories per day.  If I have this many calories per day, I should lose around __________ pounds per week. WHAT DO I NEED TO KNOW ABOUT CALORIE COUNTING? In order to meet your daily calorie goal, you will need to:  Find out how many calories are in each food you would like to eat. Try to do this before you eat.  Decide how much of the food you can eat.  Write down what you ate and how many calories it had. Doing this is called keeping a food log. WHERE DO I FIND CALORIE INFORMATION? The number of calories in a food can be found on a Nutrition Facts label. Note that all the information on a label is based on a specific serving of the food. If a food does not have a Nutrition Facts label, try to look up the calories online or ask your dietitian for help. HOW  DO I DECIDE HOW MUCH TO EAT? To decide how much of the food you can eat, you will need to consider both the number of calories in one serving and the size of one serving. This information can be found on the Nutrition Facts label. If a food does not have a Nutrition Facts label, look up the information online or ask your dietitian for help. Remember that calories are listed per serving. If you choose to have more than one serving of a food, you will have to multiply the calories per serving by the amount of servings you plan to eat. For example, the label on a package of bread might say that a serving size is 1 slice and that there are 90 calories in a serving. If you eat 1 slice, you will have eaten 90 calories. If you eat 2 slices, you will have eaten 180 calories. HOW DO I KEEP A FOOD LOG? After each meal, record the following information in your food log:  What you ate.  How much of it you ate.  How many calories it had.  Then, add up your calories. Keep your food log near you, such as in a small notebook in your pocket. Another option is to use a mobile app or website. Some programs will calculate calories  for you and show you how many calories you have left each time you add an item to the log. WHAT ARE SOME CALORIE COUNTING TIPS?  Use your calories on foods and drinks that will fill you up and not leave you hungry. Some examples of this include foods like nuts and nut butters, vegetables, lean proteins, and high-fiber foods (more than 5 g fiber per serving).  Eat nutritious foods and avoid empty calories. Empty calories are calories you get from foods or beverages that do not have many nutrients, such as candy and soda. It is better to have a nutritious high-calorie food (such as an avocado) than a food with few nutrients (such as a bag of chips).  Know how many calories are in the foods you eat most often. This way, you do not have to look up how many calories they have each time you eat  them.  Look out for foods that may seem like low-calorie foods but are really high-calorie foods, such as baked goods, soda, and fat-free candy.  Pay attention to calories in drinks. Drinks such as sodas, specialty coffee drinks, alcohol, and juices have a lot of calories yet do not fill you up. Choose low-calorie drinks like water and diet drinks.  Focus your calorie counting efforts on higher calorie items. Logging the calories in a garden salad that contains only vegetables is less important than calculating the calories in a milk shake.  Find a way of tracking calories that works for you. Get creative. Most people who are successful find ways to keep track of how much they eat in a day, even if they do not count every calorie. WHAT ARE SOME PORTION CONTROL TIPS?  Know how many calories are in a serving. This will help you know how many servings of a certain food you can have.  Use a measuring cup to measure serving sizes. This is helpful when you start out. With time, you will be able to estimate serving sizes for some foods.  Take some time to put servings of different foods on your favorite plates, bowls, and cups so you know what a serving looks like.  Try not to eat straight from a bag or box. Doing this can lead to overeating. Put the amount you would like to eat in a cup or on a plate to make sure you are eating the right portion.  Use smaller plates, glasses, and bowls to prevent overeating. This is a quick and easy way to practice portion control. If your plate is smaller, less food can fit on it.  Try not to multitask while eating, such as watching TV or using your computer. If it is time to eat, sit down at a table and enjoy your food. Doing this will help you to start recognizing when you are full. It will also make you more aware of what and how much you are eating. HOW CAN I CALORIE COUNT WHEN EATING OUT?  Ask for smaller portion sizes or child-sized portions.  Consider  sharing an entree and sides instead of getting your own entree.  If you get your own entree, eat only half. Ask for a box at the beginning of your meal and put the rest of your entree in it so you are not tempted to eat it.  Look for the calories on the menu. If calories are listed, choose the lower calorie options.  Choose dishes that include vegetables, fruits, whole grains, low-fat dairy products, and lean protein.  Focusing on smart food choices from each of the 5 food groups can help you stay on track at restaurants.  Choose items that are boiled, broiled, grilled, or steamed.  Choose water, milk, unsweetened iced tea, or other drinks without added sugars. If you want an alcoholic beverage, choose a lower calorie option. For example, a regular margarita can have up to 700 calories and a glass of wine has around 150.  Stay away from items that are buttered, battered, fried, or served with cream sauce. Items labeled "crispy" are usually fried, unless stated otherwise.  Ask for dressings, sauces, and syrups on the side. These are usually very high in calories, so do not eat much of them.  Watch out for salads. Many people think salads are a healthy option, but this is often not the case. Many salads come with bacon, fried chicken, lots of cheese, fried chips, and dressing. All of these items have a lot of calories. If you want a salad, choose a garden salad and ask for grilled meats or steak. Ask for the dressing on the side, or ask for olive oil and vinegar or lemon to use as dressing.  Estimate how many servings of a food you are given. For example, a serving of cooked rice is  cup or about the size of half a tennis ball or one cupcake wrapper. Knowing serving sizes will help you be aware of how much food you are eating at restaurants. The list below tells you how big or small some common portion sizes are based on everyday objects.  1 oz--4 stacked dice.  3 oz--1 deck of cards.  1 tsp--1  dice.  1 Tbsp-- a Ping-Pong ball.  2 Tbsp--1 Ping-Pong ball.   cup--1 tennis ball or 1 cupcake wrapper.  1 cup--1 baseball. Document Released: 01/22/2005 Document Revised: 06/08/2013 Document Reviewed: 11/27/2012 [facility] Patient Information 2015 [place], [place]. This information is not intended to replace advice given to you by your health care provider. Make sure you discuss any questions you have with your health care provider.

## 2014-09-24 ENCOUNTER — Other Ambulatory Visit: Payer: Self-pay | Admitting: Osteopathic Medicine

## 2014-09-24 ENCOUNTER — Encounter (HOSPITAL_COMMUNITY): Payer: Self-pay | Admitting: Psychiatry

## 2014-09-24 ENCOUNTER — Other Ambulatory Visit: Payer: Self-pay

## 2014-09-24 ENCOUNTER — Ambulatory Visit (HOSPITAL_COMMUNITY): Payer: 59 | Attending: Cardiology

## 2014-09-24 ENCOUNTER — Ambulatory Visit (INDEPENDENT_AMBULATORY_CARE_PROVIDER_SITE_OTHER): Payer: 59 | Admitting: Psychiatry

## 2014-09-24 VITALS — BP 124/62 | HR 62 | Ht 62.0 in | Wt 205.0 lb

## 2014-09-24 DIAGNOSIS — R011 Cardiac murmur, unspecified: Secondary | ICD-10-CM | POA: Diagnosis not present

## 2014-09-24 DIAGNOSIS — F411 Generalized anxiety disorder: Secondary | ICD-10-CM

## 2014-09-24 DIAGNOSIS — Z23 Encounter for immunization: Secondary | ICD-10-CM

## 2014-09-24 DIAGNOSIS — F3162 Bipolar disorder, current episode mixed, moderate: Secondary | ICD-10-CM

## 2014-09-24 MED ORDER — LAMOTRIGINE 25 MG PO TABS: 25.0000 mg | ORAL_TABLET | Freq: Every day | ORAL | Status: DC
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
## 2014-09-24 NOTE — Progress Notes (Signed)
Pt came into clinic today for PPD read, but it was past the time. Pt was advised she would need to wait 10-14 days in between PPD placements before we can do another one. Pt states she has to start school on 10/04/14 and can not wait that long. Order has been placed for quantiferon tb gold blood work.

## 2014-09-25 LAB — CBC WITH DIFFERENTIAL/PLATELET
Basophils Absolute: 0 10*3/uL (ref 0.0–0.1)
Basophils Relative: 0 % (ref 0–1)
EOS ABS: 0.1 10*3/uL (ref 0.0–0.7)
EOS PCT: 1 % (ref 0–5)
HCT: 34.1 % — ABNORMAL LOW (ref 36.0–46.0)
Hemoglobin: 10.6 g/dL — ABNORMAL LOW (ref 12.0–15.0)
Lymphocytes Relative: 29 % (ref 12–46)
Lymphs Abs: 1.8 10*3/uL (ref 0.7–4.0)
MCH: 25.1 pg — AB (ref 26.0–34.0)
MCHC: 31.1 g/dL (ref 30.0–36.0)
MCV: 80.8 fL (ref 78.0–100.0)
MONO ABS: 0.4 10*3/uL (ref 0.1–1.0)
MONOS PCT: 7 % (ref 3–12)
MPV: 11.2 fL (ref 8.6–12.4)
NEUTROS PCT: 63 % (ref 43–77)
Neutro Abs: 4 10*3/uL (ref 1.7–7.7)
PLATELETS: 287 10*3/uL (ref 150–400)
RBC: 4.22 MIL/uL (ref 3.87–5.11)
RDW: 14.7 % (ref 11.5–15.5)
WBC: 6.3 10*3/uL (ref 4.0–10.5)

## 2014-09-25 LAB — COMPLETE METABOLIC PANEL WITH GFR
ALBUMIN: 3.8 g/dL (ref 3.6–5.1)
ALK PHOS: 74 U/L (ref 33–115)
ALT: 10 U/L (ref 6–29)
AST: 14 U/L (ref 10–30)
BILIRUBIN TOTAL: 0.6 mg/dL (ref 0.2–1.2)
BUN: 7 mg/dL (ref 7–25)
CO2: 27 mmol/L (ref 20–31)
CREATININE: 0.72 mg/dL (ref 0.50–1.10)
Calcium: 9.2 mg/dL (ref 8.6–10.2)
Chloride: 105 mmol/L (ref 98–110)
GFR, Est African American: >89 mL/min (ref >=60)
GFR, Est Non African American: >89 mL/min (ref >=60)
GLUCOSE: 88 mg/dL (ref 65–99)
Potassium: 4.3 mmol/L (ref 3.5–5.3)
Sodium: 138 mmol/L (ref 135–146)
TOTAL PROTEIN: 6.8 g/dL (ref 6.1–8.1)

## 2014-09-25 LAB — HEMOGLOBIN A1C
Hgb A1c MFr Bld: 5.5 % (ref <5.7)
Mean Plasma Glucose: 111 mg/dL (ref <117)

## 2014-09-25 LAB — LIPID PANEL
Cholesterol: 141 mg/dL (ref 125–200)
HDL: 51 mg/dL (ref >=46)
LDL Cholesterol: 80 mg/dL (ref <130)
Total CHOL/HDL Ratio: 2.8 Ratio (ref <=5.0)
Triglycerides: 51 mg/dL (ref <150)
VLDL: 10 mg/dL (ref <30)

## 2014-09-25 LAB — TSH: TSH: 1.023 u[IU]/mL (ref 0.350–4.500)

## 2014-09-26 LAB — QUANTIFERON TB GOLD ASSAY (BLOOD)
Interferon Gamma Release Assay: NEGATIVE
Mitogen value: >10 [IU]/mL
Quantiferon Nil Value: 0.03 [IU]/mL
Quantiferon Tb Ag Minus Nil Value: 0 [IU]/mL
TB Ag value: 0.03 [IU]/mL

## 2014-09-27 LAB — IRON AND TIBC
%SAT: 31 % (ref 20–55)
IRON: 118 ug/dL (ref 42–145)
TIBC: 376 ug/dL (ref 250–470)
UIBC: 258 ug/dL (ref 125–400)

## 2014-09-27 LAB — FERRITIN: Ferritin: 17 ng/mL (ref 10–291)

## 2014-09-27 NOTE — Addendum Note (Signed)
Addended by: Maryla Morrow on: 09/27/2014 01:39 PM   Modules accepted: Orders

## 2014-09-28 ENCOUNTER — Ambulatory Visit (HOSPITAL_COMMUNITY): Payer: 59 | Admitting: Psychiatry

## 2014-10-06 ENCOUNTER — Other Ambulatory Visit (HOSPITAL_COMMUNITY): Payer: Self-pay

## 2014-10-07 ENCOUNTER — Telehealth (HOSPITAL_COMMUNITY): Payer: Self-pay | Admitting: *Deleted

## 2014-10-07 MED ORDER — LAMOTRIGINE 25 MG PO TABS: 25.0000 mg | ORAL_TABLET | Freq: Every day | ORAL | Status: DC

## 2014-10-07 NOTE — Telephone Encounter (Signed)
Pt called for a medication refill for Lamictal 25mg . Pt states she lost her prescription in the transition of moving from her home. Prescription was last filled on 8/19. Per Langley Gauss from [employer], pt will have to pay out of pocket for a new prescription.  Informed pt I would have to speak with Dr. De Nurse and called her back.

## 2014-10-07 NOTE — Telephone Encounter (Signed)
Per Dr. De Nurse, pt is authorized for a refill for Lamictal 25mg , #30. Prescription was sent to pharmacy. Called and informed pt she will have to pay for prescription out of pocket due to an early refill and the refill is only for 2 weeks until next appointment.  Pt verbalizes understanding. PT has a f/u appt on 10/19/14.

## 2014-10-18 ENCOUNTER — Ambulatory Visit (HOSPITAL_COMMUNITY): Payer: Self-pay | Admitting: Psychiatry

## 2014-10-19 ENCOUNTER — Ambulatory Visit (HOSPITAL_COMMUNITY): Payer: Self-pay | Admitting: Psychiatry

## 2014-10-21 ENCOUNTER — Encounter (HOSPITAL_COMMUNITY): Payer: Self-pay | Admitting: Psychiatry

## 2014-10-21 ENCOUNTER — Ambulatory Visit (INDEPENDENT_AMBULATORY_CARE_PROVIDER_SITE_OTHER): Payer: 59 | Admitting: Psychiatry

## 2014-10-21 DIAGNOSIS — F411 Generalized anxiety disorder: Secondary | ICD-10-CM

## 2014-10-21 DIAGNOSIS — F53 Postpartum depression: Secondary | ICD-10-CM

## 2014-10-21 DIAGNOSIS — O99345 Other mental disorders complicating the puerperium: Secondary | ICD-10-CM

## 2014-10-21 DIAGNOSIS — F3162 Bipolar disorder, current episode mixed, moderate: Secondary | ICD-10-CM

## 2014-10-21 MED ORDER — LAMOTRIGINE 25 MG PO TABS: 25.0000 mg | ORAL_TABLET | Freq: Every day | ORAL | Status: DC
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
## 2014-11-04 ENCOUNTER — Ambulatory Visit (INDEPENDENT_AMBULATORY_CARE_PROVIDER_SITE_OTHER): Payer: 59 | Admitting: Licensed Clinical Social Worker

## 2014-11-04 DIAGNOSIS — F3162 Bipolar disorder, current episode mixed, moderate: Secondary | ICD-10-CM

## 2014-11-04 DIAGNOSIS — F411 Generalized anxiety disorder: Secondary | ICD-10-CM

## 2014-11-05 NOTE — Progress Notes (Signed)
   THERAPIST PROGRESS NOTE  Session Time: 4:15pm-5:00pm  Participation Level: Active  Behavioral Response: CasualDrowsyIrritable  Type of Therapy: Individual Therapy  Treatment Goals addressed: treatment goals developed today  Interventions: Treatment planning  Suicidal/Homicidal: Denied both  Therapist Interventions: Gathered information about significant events and changes in mood and functioning since last seen at intake over a month ago. Discussed how she has been feeling stigmatized by her friends.  Told patient she was welcome to invite her supports to a therapy session so therapist can help clarify how they can most be supportive to patient when she is suicidal or having an emotional episode. Collaborated with patient to develop a goal for her treatment plan.    Summary: Reported she recently got out of the hospital for a suicide attempt.  Explained how interactions with her fiance she experienced as unsupportive contributed to her impulsive decision to overdose on pills.  Reported that she broke up with him.   Described feeling "offended" upon being discharged from the hospital and having her friends stage an intervention of sorts.  She said, "They want me to have a caretaker in case I get suicidal again."  Indicated she felt her friends were not interested in listening to her opinions about what she needs.   Talked about how she would like to focus on learning coping skills in therapy rather than process events from her past.  Michela Pitcher she would like to have better time management.     Plan: Return again in approximately 2 weeks.  Will plan on focusing on psycho-education about her diagnoses.  Diagnosis:  Bipolar Disorder       R/O Borderline Personality                       Generalized Anxiety Disorder   Garnette Scheuermann, LCSW 11/04/14

## 2014-11-11 ENCOUNTER — Encounter (HOSPITAL_COMMUNITY): Payer: Self-pay | Admitting: Psychiatry

## 2014-11-11 ENCOUNTER — Ambulatory Visit (INDEPENDENT_AMBULATORY_CARE_PROVIDER_SITE_OTHER): Payer: 59 | Admitting: Psychiatry

## 2014-11-11 VITALS — BP 118/62 | HR 73 | Ht 62.0 in | Wt 207.0 lb

## 2014-11-11 DIAGNOSIS — F3162 Bipolar disorder, current episode mixed, moderate: Secondary | ICD-10-CM

## 2014-11-11 DIAGNOSIS — F53 Postpartum depression: Secondary | ICD-10-CM

## 2014-11-11 DIAGNOSIS — F411 Generalized anxiety disorder: Secondary | ICD-10-CM | POA: Diagnosis not present

## 2014-11-11 DIAGNOSIS — O99345 Other mental disorders complicating the puerperium: Secondary | ICD-10-CM

## 2014-11-11 MED ORDER — LAMOTRIGINE 25 MG PO TABS: 75.0000 mg | ORAL_TABLET | Freq: Every day | ORAL | Status: DC

## 2014-11-11 MED ORDER — ARIPIPRAZOLE 10 MG PO TABS: 10.0000 mg | ORAL_TABLET | Freq: Every day | ORAL | Status: DC

## 2014-11-11 MED ORDER — LAMOTRIGINE 100 MG PO TABS: 100.0000 mg | ORAL_TABLET | Freq: Every day | ORAL | Status: DC

## 2014-11-11 MED ORDER — ARIPIPRAZOLE 5 MG PO TABS: 5.0000 mg | ORAL_TABLET | Freq: Every day | ORAL | Status: DC
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
## 2014-11-19 ENCOUNTER — Ambulatory Visit (HOSPITAL_COMMUNITY): Payer: Self-pay | Admitting: Licensed Clinical Social Worker

## 2014-11-25 ENCOUNTER — Ambulatory Visit (INDEPENDENT_AMBULATORY_CARE_PROVIDER_SITE_OTHER): Payer: 59 | Admitting: Licensed Clinical Social Worker

## 2014-11-25 DIAGNOSIS — F411 Generalized anxiety disorder: Secondary | ICD-10-CM

## 2014-11-25 DIAGNOSIS — F603 Borderline personality disorder: Secondary | ICD-10-CM

## 2014-11-25 NOTE — Progress Notes (Signed)
   THERAPIST PROGRESS NOTE  Session Time: 3:00pm-3:55pm  Participation Level: Active  Behavioral Response: Well groomed Alert Euthymic  Type of Therapy: Individual Therapy  Treatment Goals addressed: emotion regulation and time management  Interventions: Differential diagnosis  Suicidal/Homicidal: Denied both  Therapist Interventions: Reviewed symptoms of Borderline Personality Disorder in order to assess whether or not patient meets criteria for the disorder.  .  Discussed thoughts and feelings related to being misdiagnosed many years ago.  Shared the titles of a couple books related to Borderline Personality Disorder.       Summary:  Indicated she could relate to most of the symptoms of BPD.  Described how her changes in mood occur rapidly as opposed to episodes that last for days at a time. Acknowledged that it is going to take some time to adjust her thinking in terms of no longer being able to identify as someone with bipolar.   Asked to borrow one of the books therapist shared with her called I Hate You Don't Leave Me.    Reported she has made some progress with increasing her satisfaction with how she is using her time.     Plan: Scheduled to return next week.  Will continue discussion about BPD and recommended treatment for the disorder.     Diagnosis:  Borderline Personality Disorder                       Generalized Anxiety Disorder   Garnette Scheuermann, LCSW 11/25/14

## 2014-12-01 ENCOUNTER — Ambulatory Visit (HOSPITAL_COMMUNITY): Payer: Self-pay | Admitting: Licensed Clinical Social Worker

## 2014-12-02 ENCOUNTER — Ambulatory Visit (HOSPITAL_COMMUNITY): Payer: Self-pay | Admitting: Psychiatry

## 2014-12-03 ENCOUNTER — Ambulatory Visit (INDEPENDENT_AMBULATORY_CARE_PROVIDER_SITE_OTHER): Payer: 59 | Admitting: Licensed Clinical Social Worker

## 2014-12-03 DIAGNOSIS — F603 Borderline personality disorder: Secondary | ICD-10-CM | POA: Diagnosis not present

## 2014-12-03 DIAGNOSIS — F411 Generalized anxiety disorder: Secondary | ICD-10-CM

## 2014-12-03 NOTE — Progress Notes (Signed)
   THERAPIST PROGRESS NOTE  Session Time: 3:10-4:05pm  Participation Level: Active  Behavioral Response: Well groomed Alert Euthymic  Type of Therapy: Individual Therapy  Treatment Goals addressed: emotion regulation and time management  Interventions: Distress tolerance   Suicidal/Homicidal: Denied both  Therapist Interventions: Discussed patterns of impulsivity.  Emphasized that it is possible to slow down to be aware of your thoughts and make better choices.  Acknowledged that it is hard for some people to ignore their emotional impulses but it is possible to learn that skill.   Suggested planning ahead as far as meals for the following day.    Summary:  Admitted that she only read a few pages of the book about BPD that she borrowed.  Noted that there is a part of her that doesn't want to learn about the disorder right now.  Talked about how she doesn't want it to define her identity like bipolar disorder did for so many years.    Expressed concern about excessive spending.  Acknowledged that her spending is often tied to her mood.  She said, "The more stressed I get, the more money I spend."  Noted that eating out is what she spends the most on.  Reflected on how she used to make preparing meals a priority when she was in a relationship, but not since she has been responsible for just herself.  She said, "I'm able to do things if I am focused on taking care of others."  Noted that she would like to make changes with her spending and eating now so that she will be prepared to instill healthy habits as her daughter gets older.       Expressed satisfaction with how she has been using her time in the past week.      Plan: Scheduled to return in mid-November.    Diagnosis:  Borderline Personality Disorder                       Generalized Anxiety Disorder   Armandina Stammer 12/03/14

## 2014-12-09 ENCOUNTER — Ambulatory Visit (HOSPITAL_COMMUNITY): Payer: Self-pay | Admitting: Psychiatry

## 2014-12-10 ENCOUNTER — Encounter (HOSPITAL_COMMUNITY): Payer: Self-pay | Admitting: Psychiatry

## 2014-12-10 ENCOUNTER — Ambulatory Visit (INDEPENDENT_AMBULATORY_CARE_PROVIDER_SITE_OTHER): Payer: 59 | Admitting: Psychiatry

## 2014-12-10 VITALS — BP 128/72 | HR 67 | Ht 62.0 in | Wt 216.0 lb

## 2014-12-10 DIAGNOSIS — F3162 Bipolar disorder, current episode mixed, moderate: Secondary | ICD-10-CM | POA: Diagnosis not present

## 2014-12-10 DIAGNOSIS — F411 Generalized anxiety disorder: Secondary | ICD-10-CM

## 2014-12-10 DIAGNOSIS — F603 Borderline personality disorder: Secondary | ICD-10-CM | POA: Diagnosis not present

## 2014-12-10 MED ORDER — LAMOTRIGINE 100 MG PO TABS: 100.0000 mg | ORAL_TABLET | Freq: Every day | ORAL | Status: DC

## 2014-12-10 MED ORDER — ARIPIPRAZOLE 10 MG PO TABS: 10.0000 mg | ORAL_TABLET | Freq: Every day | ORAL | Status: DC
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
## 2014-12-13 ENCOUNTER — Ambulatory Visit (HOSPITAL_COMMUNITY): Payer: Self-pay | Admitting: Licensed Clinical Social Worker

## 2014-12-20 ENCOUNTER — Encounter (HOSPITAL_COMMUNITY): Payer: Self-pay | Admitting: *Deleted

## 2014-12-20 ENCOUNTER — Ambulatory Visit (INDEPENDENT_AMBULATORY_CARE_PROVIDER_SITE_OTHER): Payer: 59 | Admitting: Licensed Clinical Social Worker

## 2014-12-20 DIAGNOSIS — F411 Generalized anxiety disorder: Secondary | ICD-10-CM | POA: Diagnosis not present

## 2014-12-20 DIAGNOSIS — F603 Borderline personality disorder: Secondary | ICD-10-CM

## 2014-12-20 NOTE — Progress Notes (Signed)
   THERAPIST PROGRESS NOTE  Session Time: 3:10-3:55pm  Participation Level: Active  Behavioral Response: Casual (reported she cut her hair short this morning) Drowsy  Dysphoric  Type of Therapy: Individual Therapy  Treatment Goals addressed: emotion regulation and time management  Interventions: Psycho-ed about DBT   Suicidal/Homicidal: Denied both  Therapist Interventions: Provided patient with a brief description of DBT, the treatment most recommended for individuals with BPD.  Explained that there are 4 skills sets for developing better emotional control: mindfulness, emotion regulation, distress tolerance, and interpersonal effectiveness.  Noted that treatment involves a combination of weekly group skills training and individual therapy.  Offered to look into where a group is being run in the area.  Addressed concerns about having a possible Binge Eating Disorder.  Noted that this is not a diagnosis this therapist has any expertise in.  Offered to look into a referral to a provider who does have expertise.        Summary:  Brought up two main concerns today.  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  I don't want to work there."  The other concern was binge eating and weight gain.  She said, "I'm starting to hate myself when I look in the mirror.  I'm stressed all the time."     Denied doing any further reading about BPD.  Noted that each of the DBT skills sounded like things she needs to work on, but did not express any enthusiasm about getting involved in a skills training group.   Reported being open to learning about treatment with an eating disorders expert.      Plan: Scheduled to return 01/14/15.  This is shortly before she is due to have breast reduction surgery on 01/20/15.  Diagnosis:  Borderline Personality Disorder                       Generalized Anxiety Disorder   Garnette Scheuermann, LCSW 12/20/14

## 2015-01-04 ENCOUNTER — Ambulatory Visit (INDEPENDENT_AMBULATORY_CARE_PROVIDER_SITE_OTHER): Payer: 59 | Admitting: Psychiatry

## 2015-01-04 ENCOUNTER — Ambulatory Visit (INDEPENDENT_AMBULATORY_CARE_PROVIDER_SITE_OTHER): Payer: 59 | Admitting: Osteopathic Medicine

## 2015-01-04 ENCOUNTER — Encounter: Payer: Self-pay | Admitting: Osteopathic Medicine

## 2015-01-04 ENCOUNTER — Encounter (HOSPITAL_COMMUNITY): Payer: Self-pay | Admitting: Psychiatry

## 2015-01-04 VITALS — BP 115/62 | HR 84 | Temp 98.6°F | Wt 216.0 lb

## 2015-01-04 VITALS — BP 116/64 | HR 82 | Ht 62.0 in | Wt 216.0 lb

## 2015-01-04 DIAGNOSIS — L989 Disorder of the skin and subcutaneous tissue, unspecified: Secondary | ICD-10-CM

## 2015-01-04 DIAGNOSIS — F411 Generalized anxiety disorder: Secondary | ICD-10-CM | POA: Diagnosis not present

## 2015-01-04 DIAGNOSIS — F603 Borderline personality disorder: Secondary | ICD-10-CM | POA: Diagnosis not present

## 2015-01-04 DIAGNOSIS — K2970 Gastritis, unspecified, without bleeding: Secondary | ICD-10-CM | POA: Diagnosis not present

## 2015-01-04 DIAGNOSIS — F3162 Bipolar disorder, current episode mixed, moderate: Secondary | ICD-10-CM | POA: Diagnosis not present

## 2015-01-04 DIAGNOSIS — R112 Nausea with vomiting, unspecified: Secondary | ICD-10-CM

## 2015-01-04 DIAGNOSIS — F310 Bipolar disorder, current episode hypomanic: Secondary | ICD-10-CM

## 2015-01-04 MED ORDER — BUSPIRONE HCL 7.5 MG PO TABS: 7.5000 mg | ORAL_TABLET | Freq: Every day | ORAL | Status: DC | PRN

## 2015-01-04 MED ORDER — PROMETHAZINE HCL 12.5 MG PO TABS: 12.5000 mg | ORAL_TABLET | Freq: Three times a day (TID) | ORAL | Status: DC | PRN

## 2015-01-04 NOTE — Patient Instructions (Signed)
Recommend call Dr. De Nurse to see if he is comfortable increasing Abilify or Lamictal to help with anxiety issues, in the meantime we'll treat symptoms of nausea and vomiting with Phenergan/promethazine. It is also possible that he may have a viral gastritis which is causing vomiting as well. We'll give work note. Seek medical care if you or vomiting anything that is bright green, Dr. Wilmon Pali coffee grounds, throwing up blood, or if abdominal pain becomes severe.
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
## 2015-01-04 NOTE — Progress Notes (Signed)
HPI: Tasha Barrett is a 24 y.o. female who presents to [place]  today for chief complaint of:  Chief Complaint  Patient presents with  . Emesis   Anxiety/vomiting: . Quality: Has been throwing up all night, thinks work is causing anxiety. . Severity: moderate . Duration: 1 day . Context: Was thinking yesterday about going back to work today, became anxious, spent most of the night draining throwing up 4. . Assoc signs/symptoms: Abdominal pain and cramps, no diarrhea, no contipation, thrown up about 4 times, looks like digested food no bilious/bloody or coffee grounds emesis  Skin tag: Skin tag on right breast, patient would like this removed, partially with often is irritating her, no other breast changes or skin abnormality   Past medical, social and family history reviewed: Past Medical History  Diagnosis Date  . Bipolar 2 disorder ([place])   . Pneumonia     age 31   . Bipolar disorder ([place])   . Depression   . Anxiety     Used Latuda (did not work)  . Yeast infection    Past Surgical History  Procedure Laterality Date  . Cesarean section N/A 03/09/2014    Procedure: CESAREAN SECTION;  Surgeon: Donnamae Jude, MD;  Location: [facility];  Service: Obstetrics;  Laterality: N/A;   Social History  Substance Use Topics  . Smoking status: Never Smoker   . Smokeless tobacco: Never Used  . Alcohol Use: No   Family History  Problem Relation Age of Onset  . Anesthesia problems Neg Hx   . Hypotension Neg Hx   . Malignant hyperthermia Neg Hx   . Pseudochol deficiency Neg Hx   . Depression Mother   . Gout Father   . Hypertension Father   . Gout Paternal Uncle   . Gout Paternal Grandfather     Current Outpatient Prescriptions  Medication Sig Dispense Refill  . ARIPiprazole (ABILIFY) 10 MG tablet Take 1 tablet (10 mg total) by mouth daily. 30 tablet 1  . lamoTRIgine (LAMICTAL) 100 MG tablet Take 1 tablet (100 mg total) by mouth daily. 30 tablet  1  . lamoTRIgine (LAMICTAL) 25 MG tablet Take 3 tablets (75 mg total) by mouth daily. Take one tablet daily for a week and then start taking 2 tablets. 90 tablet 0   No current facility-administered medications for this visit.   Allergies  Allergen Reactions  . Amoxicillin-Pot Clavulanate Other (See Comments) and Anaphylaxis    Reaction childhood  . Latex     Possible yeast infection.      Review of Systems: CONSTITUTIONAL:  No  fever, no chills, No  unintentional weight changes HEAD/EYES/EARS/NOSE/THROAT: No headache, no vision change CARDIAC: No chest pain, no pressure/palpitations, no orthopnea RESPIRATORY: No cough, No  shortness of breath/wheeze GASTROINTESTINAL: (+) nausea, (+) vomiting, no abdominal pain, no blood in stool, no diarrhea, no constipation MUSCULOSKELETAL: No  myalgia/arthralgia ENDOCRINE: No polyuria/polydipsia/polyphagia, no heat/cold intolerance  NEUROLOGIC: No weakness, no dizziness, no slurred speech PSYCHIATRIC: (+) concerns with depression, (+) concerns with anxiety, (+) sleep problems   SKIN: Skin tag R breast she'd like removed  Exam:  BP 115/62 mmHg  Pulse 84  Temp(Src) 98.6 F (37 C)  Wt 216 lb (97.977 kg) Constitutional: VSS, see above. General Appearance: alert, well-developed, well-nourished, NAD Eyes: Normal lids and conjunctive, non-icteric sclera Ears, Nose, Mouth, Throat: Normal external inspection ears/nares/mouth/lips/gums,MMM Respiratory: Normal respiratory effort. no wheeze, no rhonchi, no rales Cardiovascular: S1/S2 normal, no murmur, no rub/gallop auscultated.  RRR.  Gastrointestinal: Nontender, no masses. No hepatomegaly, no splenomegaly. No hernia appreciated. Bowel sounds normal. Rectal exam deferred.  Musculoskeletal: Gait normal. No clubbing/cyanosis of digits.  Psychiatric: Normal judgment/insight. Normal mood and affect. Oriented x3. no suicidal or homicidal ideation. No thought disorder, no hallucination Skin: skin tag R  nipple, partially ripped off, (+) granulation tisue but no drainage/eruthema, nontender   No results found for this or any previous visit (from the past 72 hour(s)).  Procedure: Skin tag removal Location: Right nipple approximately clinic physician Skin was cleaned with a white, skin tag was grasped with forceps and cut quickly with scissors, no anesthesia was needed, patient tolerated procedure well, pressure bandage placed   ASSESSMENT/PLAN: Patient advised to call her psychiatrist with regard to possible medication change to manage anxiety, work that was given today, will manage nausea/vomiting with Phenergan, ER precautions reviewed, may be component of anxiety however patient may also be suffering from a viral gastritis issue. Patient to follow-up if not feeling any better. Patient unable to urinate in the office for pregnancy test, she states unlikely she is pregnant, LMP one week ago. Sent home with urine sample cup, she can bring this back to confirm no pregnancy.   Non-intractable vomiting with nausea, unspecified vomiting type - Plan: promethazine (PHENERGAN) 12.5 MG tablet  Viral gastritis  Generalized anxiety disorder  Bipolar affective disorder, current episode hypomanic ([place])  Skin lesion - Plan: Dermatology pathology    Return if symptoms worsen or fail to improve.

## 2015-01-06 HISTORY — PX: BREAST REDUCTION SURGERY: SHX8

## 2015-01-10 ENCOUNTER — Encounter (HOSPITAL_COMMUNITY): Payer: Self-pay

## 2015-01-10 ENCOUNTER — Ambulatory Visit (INDEPENDENT_AMBULATORY_CARE_PROVIDER_SITE_OTHER): Payer: 59 | Admitting: Licensed Clinical Social Worker

## 2015-01-10 DIAGNOSIS — F314 Bipolar disorder, current episode depressed, severe, without psychotic features: Secondary | ICD-10-CM

## 2015-01-10 DIAGNOSIS — F411 Generalized anxiety disorder: Secondary | ICD-10-CM | POA: Diagnosis not present

## 2015-01-10 DIAGNOSIS — F603 Borderline personality disorder: Secondary | ICD-10-CM | POA: Diagnosis not present

## 2015-01-10 NOTE — Progress Notes (Signed)
   THERAPIST PROGRESS NOTE  Session Time: 1:00pm-1:50pm  Participation Level: Active  Behavioral Response: Casual  Drowsy  Depressed, Flat affect  Type of Therapy: Individual Therapy  Treatment Goals addressed: emotion regulation   Interventions: Assessment, problem solving, exploration of strengths   Suicidal/Homicidal: SI without plan or intent, denied HI  Therapist Interventions:    Gathered information about current symptoms and impairments in functioning. Had patient complete a PHQ-9 and GAD-7 to assess for depression and anxiety severity.   Discussed how patient's current job is not a good fit for her.  Helped patient consider pros and cons of quitting.   Explored patient's strengths which she may be able to use in a different line of work.      Summary:  Depression screen [facility] 2/9 01/10/2015 08/26/2013  Decreased Interest 3 0  Down, Depressed, Hopeless 3 0  PHQ - 2 Score 6 0  Altered sleeping 3 -  Tired, decreased energy 3 -  Change in appetite 1 -  Feeling bad or failure about yourself  3 -  Trouble concentrating 3 -  Moving slowly or fidgety/restless 2 -  Suicidal thoughts 1 -  PHQ-9 Score 22 -  Difficult doing work/chores Very difficult -    GAD 7 : Generalized Anxiety Score 01/10/2015  Nervous, Anxious, on Edge 2  Control/stop worrying 3  Worry too much - different things 3  Trouble relaxing 3  Restless 1  Easily annoyed or irritable 2  Afraid - awful might happen 3  Total GAD 7 Score 17  Anxiety Difficulty Extremely difficult    Reported that she was asked to leave work early today and not to return until she had been seen for a mental health appointment.  She said, "I had a little break down."  This consisted of crying.  Described experiencing feelings of dread about going to work which have been apparent for quite some time, but have worsened in the past few weeks.  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  Reflected on how she has always had difficulties interacting with others in previous jobs.  Also reflected on how she has never been skilled at staying organized.   Talked about how she would ultimately like to work for herself.  She said, "I want to freelance copy edit."   Only has one week of work before taking time off for her breast surgery.  Won't have to return to work until January.  Indicated she felt she could function well enough to finish out the week.      Plan: Scheduled to return next week.    Diagnosis:   Bipolar Disorder, depressed, severe Borderline Personality Disorder  Generalized Anxiety Disorder   Garnette Scheuermann, LCSW 01/10/15

## 2015-01-11 ENCOUNTER — Ambulatory Visit (INDEPENDENT_AMBULATORY_CARE_PROVIDER_SITE_OTHER): Payer: 59 | Admitting: Psychiatry

## 2015-01-11 ENCOUNTER — Encounter (HOSPITAL_COMMUNITY): Payer: Self-pay | Admitting: Psychiatry

## 2015-01-11 VITALS — BP 120/70 | HR 89 | Ht 62.0 in | Wt 215.0 lb

## 2015-01-11 DIAGNOSIS — F411 Generalized anxiety disorder: Secondary | ICD-10-CM

## 2015-01-11 DIAGNOSIS — F314 Bipolar disorder, current episode depressed, severe, without psychotic features: Secondary | ICD-10-CM

## 2015-01-11 DIAGNOSIS — F603 Borderline personality disorder: Secondary | ICD-10-CM | POA: Diagnosis not present

## 2015-01-11 MED ORDER — LAMOTRIGINE 150 MG PO TABS: 150.0000 mg | ORAL_TABLET | Freq: Every day | ORAL | Status: DC
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
## 2015-01-12 ENCOUNTER — Ambulatory Visit (INDEPENDENT_AMBULATORY_CARE_PROVIDER_SITE_OTHER): Payer: 59 | Admitting: Licensed Clinical Social Worker

## 2015-01-12 ENCOUNTER — Ambulatory Visit (HOSPITAL_COMMUNITY): Payer: Self-pay | Admitting: Licensed Clinical Social Worker

## 2015-01-12 DIAGNOSIS — F314 Bipolar disorder, current episode depressed, severe, without psychotic features: Secondary | ICD-10-CM

## 2015-01-12 DIAGNOSIS — F603 Borderline personality disorder: Secondary | ICD-10-CM

## 2015-01-12 DIAGNOSIS — F411 Generalized anxiety disorder: Secondary | ICD-10-CM

## 2015-01-12 NOTE — Progress Notes (Signed)
   THERAPIST PROGRESS NOTE  Session Time: 4:05pm-5:00pm  Participation Level: Active  Behavioral Response: Casual Alert Depressed with Flat affect  Type of Therapy: Individual Therapy  Treatment Goals addressed: emotion regulation   Interventions: Assessment, problem solving, exploration of strengths   Suicidal/Homicidal: SI without plan or intent, denied HI  Therapist Interventions:    Recommended the Psych Intensive Outpatient Program as a treatment option for patient to participate in while she is on leave from work.   Discussed how patient can make the best use of her time during her time away from work.  Noted that it is important to engage in activities even though you may not feel like it when you are depressed.  At the same time you need to adjust your goals based on the severity of your symptoms and give yourself credit for what you are accomplishing.  Also acknowledged that it is important to limit extra sources of stress if at all possible so that you can focus on self care.  Stressed importance of staying connected with sources of support.               Summary:  Arrived with FMLA paperwork and explained that her employer recommended taking a paid leave of absence.  Indicated she is not opposed to taking time off to focus on developing greater emotional stability.   Concluded that participating in the Psych IOP is not a viable option at this time because of the surgery she is having next week.  Post-surgery her mobility will be limited so she would not be able to tolerate sitting in a group for 3 hours.  Expressed satisfaction with a plan to see this therapist once a week for the remainder of the month. Intends to use some of her time off to do some job searching.  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  Planning to keep in touch with her step mom whom she describes as supportive and understanding.       Plan: Scheduled to return next week.  Will focus on looking  at how patient's thoughts influence her emotions and behavior.      Diagnosis:   Bipolar Disorder, depressed, severe Borderline Personality Disorder  Generalized Anxiety Disorder   Garnette Scheuermann, LCSW 01/12/15

## 2015-01-14 ENCOUNTER — Ambulatory Visit (HOSPITAL_COMMUNITY): Payer: Self-pay | Admitting: Licensed Clinical Social Worker

## 2015-01-19 ENCOUNTER — Ambulatory Visit (INDEPENDENT_AMBULATORY_CARE_PROVIDER_SITE_OTHER): Payer: 59 | Admitting: Licensed Clinical Social Worker

## 2015-01-19 DIAGNOSIS — F603 Borderline personality disorder: Secondary | ICD-10-CM

## 2015-01-19 DIAGNOSIS — F411 Generalized anxiety disorder: Secondary | ICD-10-CM | POA: Diagnosis not present

## 2015-01-19 NOTE — Progress Notes (Signed)
   THERAPIST PROGRESS NOTE  Session Time: 3:00pm-4:00pm  Participation Level: Active  Behavioral Response: Casual Alert Somewhat anxious  Type of Therapy: Individual Therapy  Treatment Goals addressed: emotion regulation   Interventions:  Building emotional awareness  Suicidal/Homicidal: Denied both  Therapist Interventions:   Talked about how it can be helpful to build awareness of our thoughts and feelings. Introduced a tool called the Self Monitoring of Feelings Form that can be used to increase that awareness. Guided patient through completing a personal entry. Explored pros and cons of having patient's significant other, Chris involved in treatment.  Had her sign a consent form.  Left a message on his voicemail inviting him to participate in treatment.              Summary:  Expressed skepticism about the usefulness of the Self Monitoring of Feelings.  Said, "Honestly I don't feel like it is going to make a difference."  Did agree to complete 4 entries between now and next session. Indicated that she is in favor of having Gerald Stabs involved, but admits that part of her questions his intent.    Reported that overall she is not feeling satisfied with how she is using her time.  Said, "I busy myself a lot, but I busy myself with things I don't even need to be doing."         Plan: Scheduled to return next week.        Diagnosis:   Bipolar Disorder, depressed, severe Borderline Personality Disorder  Generalized Anxiety Disorder   Garnette Scheuermann, LCSW 01/19/15

## 2015-01-25 ENCOUNTER — Ambulatory Visit (INDEPENDENT_AMBULATORY_CARE_PROVIDER_SITE_OTHER): Payer: 59 | Admitting: Licensed Clinical Social Worker

## 2015-01-25 DIAGNOSIS — F411 Generalized anxiety disorder: Secondary | ICD-10-CM | POA: Diagnosis not present

## 2015-01-25 DIAGNOSIS — F603 Borderline personality disorder: Secondary | ICD-10-CM | POA: Diagnosis not present

## 2015-01-25 NOTE — Progress Notes (Signed)
   THERAPIST PROGRESS NOTE  Session Time: [id number]  Participation Level: Active  Behavioral Response: Casual Alert Tearful  Type of Therapy: Individual Therapy  Treatment Goals addressed: emotion regulation   Interventions: Supportive therapy   Suicidal/Homicidal: Denied both  Therapist Interventions:  Reviewed homework to complete 4 entries on the Self Monitoring of Feelings Form.  Gathered information about patient's relationship history with her daughter's dad, Gerald Stabs.  Asked patient to describe the things she likes about him.  Also encouraged sharing thoughts and feelings about difficulties in their relationship.  Further explored thoughts and feelings about having him participate in treatment.               Summary:  Completed the form correctly.  Said that it was not difficult for her to fill it out.   Described current relationship with Gerald Stabs as "weird."  Reported, "I am very much in love with him" and yet she has suggested limiting their level of intimacy.  Indicated she sets boundaries in their relationship but then violates the boundaries she has set.  Identified many qualities she likes about him.  Said, "I think he loves me for who I am.  He doesn't tear me down."  Expressed a belief that much of the difficulties in their relationship come from his reluctance to develop an understanding of her mental health issues.  Described feeling abandoned after the birth of their daughter and later when she was hospitalized for a suicide attempt.  Likely afraid of further abandonment if she takes the risk of allowing him to be involved in her treatment.                 Plan: Scheduled to return next week.  Reports she will have to schedule sessions less often in January because she will start to have a co-pay.          Diagnosis:   Bipolar Disorder, depressed, severe Borderline Personality Disorder  Generalized Anxiety Disorder   Armandina Stammer 01/25/2015

## 2015-01-26 ENCOUNTER — Ambulatory Visit (HOSPITAL_COMMUNITY): Payer: 59 | Admitting: Licensed Clinical Social Worker

## 2015-01-28 ENCOUNTER — Other Ambulatory Visit (HOSPITAL_COMMUNITY): Payer: Self-pay | Admitting: Psychiatry

## 2015-02-02 ENCOUNTER — Ambulatory Visit (INDEPENDENT_AMBULATORY_CARE_PROVIDER_SITE_OTHER): Payer: 59 | Admitting: Licensed Clinical Social Worker

## 2015-02-02 ENCOUNTER — Telehealth (HOSPITAL_COMMUNITY): Payer: Self-pay

## 2015-02-02 DIAGNOSIS — F603 Borderline personality disorder: Secondary | ICD-10-CM | POA: Diagnosis not present

## 2015-02-02 DIAGNOSIS — F411 Generalized anxiety disorder: Secondary | ICD-10-CM | POA: Diagnosis not present

## 2015-02-02 NOTE — Telephone Encounter (Signed)
Received medication request for Buspar 7.5mg . Per Dr. De Nurse, pt is authorized for a refill for Buspar 7.5mg , # 30. Rx was sent to pharmacy. Pt has a f/u appt on 02/11/15. Called and informed pt of Rx status. Pt verbalizes understanding.

## 2015-02-02 NOTE — Telephone Encounter (Signed)
Tasha Barrett needs a work note to go back to work after being on [employer]. She needs by next Wednesday. Please call her when it is ready and she will come by and pick up.
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
## 2015-02-08 ENCOUNTER — Ambulatory Visit (INDEPENDENT_AMBULATORY_CARE_PROVIDER_SITE_OTHER): Payer: BC Managed Care – PPO | Admitting: Licensed Clinical Social Worker

## 2015-02-08 DIAGNOSIS — F411 Generalized anxiety disorder: Secondary | ICD-10-CM

## 2015-02-08 DIAGNOSIS — F3132 Bipolar disorder, current episode depressed, moderate: Secondary | ICD-10-CM | POA: Diagnosis not present

## 2015-02-08 DIAGNOSIS — F603 Borderline personality disorder: Secondary | ICD-10-CM | POA: Diagnosis not present

## 2015-02-08 NOTE — Progress Notes (Signed)
   THERAPIST PROGRESS NOTE  Session Time: 3:00pm-3:45pm  Participation Level: Active  Behavioral Response: Casual Alert Anxious  Type of Therapy: Individual Therapy  Treatment Goals addressed: emotion regulation   Interventions: Assessment and problem solving  Suicidal/Homicidal: Denied both  Therapist Interventions:   Patient requested an appointment today so that she could get a letter stating she may return to work. Had patient complete a PHQ 9 and GAD 7 to assess for severity of depression and anxiety symptoms. Explored readiness to return to work. Considered options to increase the probability of her being able to cope with anxiety in the workplace.             Summary:   Depression screen [facility] 2/9 02/08/2015 01/10/2015 08/26/2013  Decreased Interest 3 3 0  Down, Depressed, Hopeless 2 3 0  PHQ - 2 Score 5 6 0  Altered sleeping 2 3 -  Tired, decreased energy 2 3 -  Change in appetite 2 1 -  Feeling bad or failure about yourself  2 3 -  Trouble concentrating 2 3 -  Moving slowly or fidgety/restless 0 2 -  Suicidal thoughts 0 1 -  PHQ-9 Score 15 22 -  Difficult doing work/chores - Very difficult -   GAD 7 : Generalized Anxiety Score 02/08/2015 01/10/2015  Nervous, Anxious, on Edge 3 2  Control/stop worrying 2 3  Worry too much - different things 2 3  Trouble relaxing 3 3  Restless 2 1  Easily annoyed or irritable 2 2  Afraid - awful might happen 3 3  Total GAD 7 Score 17 17  Anxiety Difficulty Very difficult Extremely difficult    Depression has alleviated some.  Attributed it in part to not having to deal with work related stress in the past few weeks. Also noted feeling better after getting reassurance from her daughter's dad that he wants to be in a relationship with her. Anxiety continues to be significant.  Acknowledged dreading going back to work. Admitted to having some nightmares about it. Not feeling confident about her ability to complete tasks for work  satisfactorily.  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  He does not believe they will be open to making any accommodations. Ultimately plans to pursue another job.         Plan:   Scheduled to return January 13th.  Plan will be to introduce mindfulness.  Provided patient with some pages about it to read for homework.          Diagnosis:     Borderline Personality Disorder  Generalized Anxiety Disorder Bipolar Disorder, depressed, moderate  Armandina Stammer 02/08/2015

## 2015-02-09 ENCOUNTER — Ambulatory Visit (HOSPITAL_COMMUNITY): Payer: Self-pay | Admitting: Licensed Clinical Social Worker

## 2015-02-11 ENCOUNTER — Encounter (HOSPITAL_COMMUNITY): Payer: Self-pay | Admitting: Psychiatry

## 2015-02-11 ENCOUNTER — Ambulatory Visit (INDEPENDENT_AMBULATORY_CARE_PROVIDER_SITE_OTHER): Payer: 59 | Admitting: Psychiatry

## 2015-02-11 VITALS — BP 124/70 | HR 89 | Ht 62.0 in | Wt 212.0 lb

## 2015-02-11 DIAGNOSIS — F603 Borderline personality disorder: Secondary | ICD-10-CM

## 2015-02-11 DIAGNOSIS — F411 Generalized anxiety disorder: Secondary | ICD-10-CM | POA: Diagnosis not present

## 2015-02-11 DIAGNOSIS — F3132 Bipolar disorder, current episode depressed, moderate: Secondary | ICD-10-CM

## 2015-02-11 MED ORDER — ARIPIPRAZOLE 10 MG PO TABS: 10.0000 mg | ORAL_TABLET | Freq: Every day | ORAL | Status: DC

## 2015-02-11 MED ORDER — BUSPIRONE HCL 7.5 MG PO TABS: ORAL_TABLET | ORAL | Status: DC

## 2015-02-11 MED ORDER — LAMOTRIGINE 150 MG PO TABS: 150.0000 mg | ORAL_TABLET | Freq: Every day | ORAL | Status: DC
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
## 2015-02-18 ENCOUNTER — Ambulatory Visit (INDEPENDENT_AMBULATORY_CARE_PROVIDER_SITE_OTHER): Payer: 59 | Admitting: Licensed Clinical Social Worker

## 2015-02-18 DIAGNOSIS — F603 Borderline personality disorder: Secondary | ICD-10-CM | POA: Diagnosis not present

## 2015-02-22 NOTE — Progress Notes (Signed)
   THERAPIST PROGRESS NOTE  Session Time: 3:10pm-3:55pm  Participation Level: Active  Behavioral Response: Casual Alert Agitated  Type of Therapy: Individual Therapy  Treatment Goals addressed: emotion regulation   Interventions: Mindfulness  Suicidal/Homicidal: Denied both  Therapist Interventions:  Educated patient about mindfulness and how practicing it in different ways can be beneficial.  Emphasized how learning to focus on the present can help you to feel more in control of your emotions.  Explained how it can be useful to practice at times when you catch yourself having unhelpful thoughts.  Described how you can choose to do tasks in a mindful way.               Summary:    Had her baby with her today.  This turned out to be fairly distracting.  Reported she and her daughter's dad are exclusively dating.  Since they established that she claims her mood has been much better. Did not read the pages about mindfulness for homework.  She said, "I didn't realize you wanted me to make it a priority."   Does not appear to be motivated to learn and apply the skills being presented.  Resistant to therapy sessions being conducted in a structured manner.  Acknowledged that in general she is not a structured person.  Agreed to practice mindfulness skills between now and next session.    Plan:  Scheduled to return Feb 3rd.  Will check on implementation of mindfulness skills.         Diagnosis:     Borderline Personality Disorder  Generalized Anxiety Disorder Bipolar Disorder  Armandina Stammer 02/18/2015

## 2015-03-01 ENCOUNTER — Telehealth (HOSPITAL_COMMUNITY): Payer: Self-pay | Admitting: *Deleted

## 2015-03-01 MED ORDER — ARIPIPRAZOLE 10 MG PO TABS: 10.0000 mg | ORAL_TABLET | Freq: Every day | ORAL | Status: DC

## 2015-03-01 NOTE — Telephone Encounter (Signed)
Received medication request from [facility] for a 90 day supply for Abilify 10mg . Per De Nurse, pt is authorized for a refill for Abilify 10mg , #90. Prescription was sent to pharmacy. Pt is schedule for a f/u appt on 04/15/15. Called and informed pt of prescription status. Pt verbalizes understanding.

## 2015-03-11 ENCOUNTER — Ambulatory Visit (HOSPITAL_COMMUNITY): Payer: 59 | Admitting: Licensed Clinical Social Worker

## 2015-03-25 ENCOUNTER — Ambulatory Visit (HOSPITAL_COMMUNITY): Payer: 59 | Admitting: Licensed Clinical Social Worker

## 2015-04-04 ENCOUNTER — Ambulatory Visit (INDEPENDENT_AMBULATORY_CARE_PROVIDER_SITE_OTHER): Payer: BC Managed Care – PPO | Admitting: Osteopathic Medicine

## 2015-04-04 ENCOUNTER — Encounter: Payer: Self-pay | Admitting: Osteopathic Medicine

## 2015-04-04 VITALS — BP 129/60 | HR 77 | Temp 98.0°F | Ht 62.0 in | Wt 222.0 lb

## 2015-04-04 DIAGNOSIS — F3170 Bipolar disorder, currently in remission, most recent episode unspecified: Secondary | ICD-10-CM

## 2015-04-04 DIAGNOSIS — K2970 Gastritis, unspecified, without bleeding: Secondary | ICD-10-CM

## 2015-04-04 MED ORDER — ARIPIPRAZOLE 10 MG PO TABS: 10.0000 mg | ORAL_TABLET | Freq: Every day | ORAL | Status: DC

## 2015-04-04 NOTE — Progress Notes (Signed)
HPI: Tasha Barrett is a 25 y.o. female who presents to [place]  today for chief complaint of:  Chief Complaint  Patient presents with  . Headache    PATIENT REPORTED HAVING A FEVER FOR 2 DAYS   ILLNESS OVER THE WEEKENS . Quality: fever up to 101.8, yesterday 100, today nothing . Assoc signs/symptoms: nausea, headache, loose stools. Overall feeling better today, fever and headache have resolved, still a bit nausea . Duration: 2 days  . Modifying factors: has tried the following OTC/Rx medications: none  without relief . Context: ate some questionable fish   BIPOLAR: Needs refill on Abilify, doing well on this medicine, following with psych and is out of the meds   Past medical, social and family history reviewed. Current medications and allergies reviewed.     Review of Systems: CONSTITUTIONAL: yes fever/chills HEAD/EYES/EARS/NOSE/THROAT: yes headache, no vision change or hearing change, no sore throat CARDIAC: No chest pain/pressure/palpitations, no orthopnea RESPIRATORY: no cough, no shortness of breath GASTROINTESTINAL: yes nausea, no vomiting, yes abdominal pain, no blood in stool, yes diarrhea MUSCULOSKELETAL: no myalgia/arthralgia   Exam:  BP 129/60 mmHg  Pulse 77  Temp(Src) 98 F (36.7 C) (Oral)  Ht 5\' 2"  (1.575 m)  Wt 222 lb (100.699 kg)  BMI 40.59 kg/m2 Constitutional: VSS, see above. General Appearance: alert, well-developed, well-nourished, NAD Eyes: Normal lids and conjunctive, non-icteric sclera,  Ears, Nose, Mouth, Throat: Normal external inspection ears/nares/mouth/lips/gums, normal TM, MMM;  Respiratory: Normal respiratory effort. No  wheeze/rhonchi/rales Cardiovascular: S1/S2 normal, no murmur/rub/gallop auscultated. RRR.  No lower extremity edema.  Gastrointestinal: nontender, nondistended, BS normal x 4 Psych: intact judgment/insight, normal mood and affect, oriented   No results found for this or any previous  visit (from the past 72 hour(s)). No results found.   ASSESSMENT/PLAN: Work note given for patient. Psych meds refilled but only 30 days and she needst o followup with behavioral health for additional refills. ER/RTC precautions reviewed. Pt decliens refill of nausea medication. Symptoms most likely due to gastritis/food poisoning, but let me know if no better or if worse. Denies possibility of pregnancy  Gastritis  Bipolar affective disorder in remission ([place]) - Plan: ARIPiprazole (ABILIFY) 10 MG tablet    Return if symptoms worsen or fail to improve.

## 2015-04-08 ENCOUNTER — Ambulatory Visit (HOSPITAL_COMMUNITY): Payer: Self-pay | Admitting: Licensed Clinical Social Worker

## 2015-04-12 ENCOUNTER — Emergency Department (INDEPENDENT_AMBULATORY_CARE_PROVIDER_SITE_OTHER)
Admission: EM | Admit: 2015-04-12 | Discharge: 2015-04-12 | Disposition: A | Discharge status: Home / Self Care | Payer: 59 | Source: Home / Self Care | Attending: Family Medicine | Admitting: Family Medicine

## 2015-04-12 DIAGNOSIS — R112 Nausea with vomiting, unspecified: Secondary | ICD-10-CM | POA: Diagnosis not present

## 2015-04-12 DIAGNOSIS — R1084 Generalized abdominal pain: Secondary | ICD-10-CM | POA: Diagnosis not present

## 2015-04-12 LAB — POCT INFLUENZA A/B
Influenza A, POC: NEGATIVE
Influenza B, POC: NEGATIVE

## 2015-04-12 MED ORDER — ONDANSETRON HCL 4 MG PO TABS: 4.0000 mg | ORAL_TABLET | Freq: Four times a day (QID) | ORAL | Status: DC

## 2015-04-12 NOTE — ED Provider Notes (Signed)
CSN: [id number]     Arrival date & time 04/12/15  1909 History   First MD Initiated Contact with Patient 04/12/15 1924     Chief Complaint  Patient presents with  . Emesis   (Consider location/radiation/quality/duration/timing/severity/associated sxs/prior Treatment) HPI  The pt is a 25yo female presenting to [facility] with c/o sudden onset nausea around 3PM and generalized abdominal pain and cramping.  She had 1 episode of vomiting around 6PM stating she vomited all over her car.  She notes a Mudlogger, her sister and mother have all been sick with a stomach virus, which pt believes all started with her infant daughter who is now better.  Pt does want to make sure she is not pregnant though.  Denies fever, chills, cough, congestion, or diarrhea but states her mother did have diarrhea and has been sick for at least 2 days.  No recent travel.   Past Medical History  Diagnosis Date  . Bipolar 2 disorder ([place])   . Pneumonia     age 37   . Bipolar disorder ([place])   . Depression   . Anxiety     Used Latuda (did not work)  . Yeast infection    Past Surgical History  Procedure Laterality Date  . Cesarean section N/A 03/09/2014    Procedure: CESAREAN SECTION;  Surgeon: Donnamae Jude, MD;  Location: [facility];  Service: Obstetrics;  Laterality: N/A;   Family History  Problem Relation Age of Onset  . Anesthesia problems Neg Hx   . Hypotension Neg Hx   . Malignant hyperthermia Neg Hx   . Pseudochol deficiency Neg Hx   . Depression Mother   . Gout Father   . Hypertension Father   . Gout Paternal Uncle   . Gout Paternal Grandfather    Social History  Substance Use Topics  . Smoking status: Never Smoker   . Smokeless tobacco: Never Used  . Alcohol Use: No   OB History    Gravida Para Term Preterm AB TAB SAB Ectopic Multiple Living   1 1 1       0 1     Review of Systems  Constitutional: Negative for fever and chills.  HENT: Negative for congestion, ear pain, sore throat, trouble swallowing and  voice change.   Respiratory: Negative for cough and shortness of breath.   Cardiovascular: Negative for chest pain and palpitations.  Gastrointestinal: Positive for nausea, vomiting and abdominal pain. Negative for diarrhea.  Musculoskeletal: Negative for myalgias, back pain and arthralgias.  Skin: Negative for rash.  Neurological: Positive for light-headedness. Negative for headaches.    Allergies  Amoxicillin-pot clavulanate and Latex  Home Medications   Prior to Admission medications   Medication Sig Start Date End Date Taking? Authorizing Provider  ARIPiprazole (ABILIFY) 10 MG tablet Take 1 tablet (10 mg total) by mouth daily. 04/04/15   Emeterio Reeve, DO  busPIRone (BUSPAR) 7.5 MG tablet TAKE 1 TABLET (7.5 MG TOTAL) BY MOUTH DAILY AS NEEDED. 02/11/15   Merian Capron, MD  lamoTRIgine (LAMICTAL) 150 MG tablet Take 1 tablet (150 mg total) by mouth daily. 02/11/15   Merian Capron, MD  ondansetron (ZOFRAN) 4 MG tablet Take 1 tablet (4 mg total) by mouth every 6 (six) hours. 04/12/15   Noland Fordyce, PA-C   Meds Ordered and Administered this Visit  Medications - No data to display  BP 136/75 mmHg  Pulse 100  Temp(Src) 98.7 F (37.1 C) (Oral)  Ht 5\' 2"  (1.575 m)  Wt 222 lb (  100.699 kg)  BMI 40.59 kg/m2  SpO2 99%  LMP 03/21/2015 No data found.   Physical Exam  Constitutional: She appears well-developed and well-nourished. No distress.  HENT:  Head: Normocephalic and atraumatic.  Right Ear: Tympanic membrane normal.  Left Ear: Tympanic membrane normal.  Nose: Nose normal.  Mouth/Throat: Uvula is midline, oropharynx is clear and moist and mucous membranes are normal.  Eyes: Conjunctivae are normal. No scleral icterus.  Neck: Normal range of motion. Neck supple.  Cardiovascular: Normal rate, regular rhythm and normal heart sounds.   Pulmonary/Chest: Effort normal and breath sounds normal. No respiratory distress. She has no wheezes. She has no rales.  Abdominal: Soft. She  exhibits no distension and no mass. Bowel sounds are increased. There is tenderness. There is no rebound and no guarding.  Soft, mild diffuse tenderness. No rebound or guarding.   Musculoskeletal: Normal range of motion.  Neurological: She is alert.  Skin: Skin is warm and dry. She is not diaphoretic.  Nursing note and vitals reviewed.   ED Course  Procedures (including critical care time)  Labs Review Labs Reviewed  POCT INFLUENZA A/B    Imaging Review No results found.    MDM   1. Non-intractable vomiting with nausea, unspecified vomiting type   2. Generalized abdominal pain     Pt c/o sudden onset generalized abdominal pain, nausea and vomiting. Multiple sick contacts with same symptoms.    Moist mucous membranes. Vitals: WNL  Rapid flu: negative.  No other known sick contacts were tested for the flu but pt states she mainly needs a work note, otherwise she likely wouldn't have come in.  Rx: Zofran   Encouraged fluids and rest. F/u with PCP in 3-4 days if not improving, sooner if worsening.  Discussed symptoms that warrant emergent care in the ED.    Noland Fordyce, PA-C 04/12/15 2002

## 2015-04-12 NOTE — ED Notes (Signed)
Started not feeling well this afternoon around 3 pm today.  Threw up around 6 pm.

## 2015-04-14 ENCOUNTER — Telehealth: Payer: Self-pay

## 2015-04-15 ENCOUNTER — Ambulatory Visit (HOSPITAL_COMMUNITY): Payer: Self-pay | Admitting: Psychiatry

## 2015-04-22 ENCOUNTER — Ambulatory Visit (INDEPENDENT_AMBULATORY_CARE_PROVIDER_SITE_OTHER): Payer: 59 | Admitting: Licensed Clinical Social Worker

## 2015-04-22 DIAGNOSIS — F603 Borderline personality disorder: Secondary | ICD-10-CM

## 2015-04-22 DIAGNOSIS — F319 Bipolar disorder, unspecified: Secondary | ICD-10-CM

## 2015-04-22 DIAGNOSIS — F411 Generalized anxiety disorder: Secondary | ICD-10-CM

## 2015-04-25 NOTE — Progress Notes (Signed)
   THERAPIST PROGRESS NOTE  Session Time: 4:05pm-5:00pm  Participation Level: Active  Behavioral Response: Casual Alert Euthymic  Type of Therapy: Individual Therapy  Treatment Goals addressed: emotion regulation   Interventions: Assessment and solution focused  Suicidal/Homicidal: Denied both  Therapist Interventions:   Gathered information about significant events and changes in mood and functioning since last seen in January.  Discussed how patient has been practicing mindfulness.  Explored goals patient is considering for the next year.  Encouraged her to reestablish a relationship with her mentor at work.        Summary:   Explained that she has not been scheduling appointments because she cannot afford to come very often.  Also has not seen psychiatrist for the same reason.  Noted that she has not been taking her psych meds "for weeks."  Says she hasn't noticed any significant changes in mood.   Reported listening to meditations? before bed and tgat it seems to help her fall asleep. 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  Hasn't met with her mentor in several weeks.  Knows that if she wants to transfer to a school? in [place] she will need a reference.  Doesn't know of anyone she can count on for a recommendation. Continues to engage in self destructive procrastination.          Plan:  Scheduled to return in 2 weeks.           Diagnosis:     Borderline Personality Disorder  Generalized Anxiety Disorder Bipolar Disorder  Armandina Stammer 04/22/2015

## 2015-05-06 ENCOUNTER — Ambulatory Visit (INDEPENDENT_AMBULATORY_CARE_PROVIDER_SITE_OTHER): Payer: 59 | Admitting: Licensed Clinical Social Worker

## 2015-05-06 DIAGNOSIS — F411 Generalized anxiety disorder: Secondary | ICD-10-CM

## 2015-05-06 DIAGNOSIS — F603 Borderline personality disorder: Secondary | ICD-10-CM
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
## 2015-05-11 ENCOUNTER — Encounter (HOSPITAL_COMMUNITY): Payer: Self-pay

## 2015-05-11 ENCOUNTER — Emergency Department (HOSPITAL_COMMUNITY)
Admission: EM | Admit: 2015-05-11 | Discharge: 2015-05-11 | Disposition: A | Discharge status: Other Acute Inpatient Hospital | Payer: 59 | Attending: Emergency Medicine | Admitting: Emergency Medicine

## 2015-05-11 ENCOUNTER — Inpatient Hospital Stay (HOSPITAL_COMMUNITY)
Admission: AD | Admit: 2015-05-11 | Discharge: 2015-05-13 | DRG: 885 | Disposition: A | Discharge status: Home / Self Care | Payer: 59 | Source: Intra-hospital | Attending: Psychiatry | Admitting: Psychiatry

## 2015-05-11 DIAGNOSIS — F331 Major depressive disorder, recurrent, moderate: Secondary | ICD-10-CM | POA: Diagnosis present

## 2015-05-11 DIAGNOSIS — Z8701 Personal history of pneumonia (recurrent): Secondary | ICD-10-CM | POA: Diagnosis not present

## 2015-05-11 DIAGNOSIS — Z880 Allergy status to penicillin: Secondary | ICD-10-CM | POA: Insufficient documentation

## 2015-05-11 DIAGNOSIS — R45851 Suicidal ideations: Secondary | ICD-10-CM

## 2015-05-11 DIAGNOSIS — Z91040 Latex allergy status: Secondary | ICD-10-CM | POA: Diagnosis not present

## 2015-05-11 DIAGNOSIS — F3132 Bipolar disorder, current episode depressed, moderate: Secondary | ICD-10-CM | POA: Insufficient documentation

## 2015-05-11 DIAGNOSIS — F332 Major depressive disorder, recurrent severe without psychotic features: Secondary | ICD-10-CM | POA: Diagnosis present

## 2015-05-11 DIAGNOSIS — Z9119 Patient's noncompliance with other medical treatment and regimen: Secondary | ICD-10-CM | POA: Diagnosis not present

## 2015-05-11 DIAGNOSIS — Z8619 Personal history of other infectious and parasitic diseases: Secondary | ICD-10-CM | POA: Diagnosis not present

## 2015-05-11 DIAGNOSIS — F319 Bipolar disorder, unspecified: Secondary | ICD-10-CM | POA: Insufficient documentation

## 2015-05-11 DIAGNOSIS — F603 Borderline personality disorder: Secondary | ICD-10-CM | POA: Diagnosis present

## 2015-05-11 LAB — COMPREHENSIVE METABOLIC PANEL
ALBUMIN: 3.7 g/dL (ref 3.5–5.0)
ALK PHOS: 75 U/L (ref 38–126)
ALT: 14 U/L (ref 14–54)
ANION GAP: 11 (ref 5–15)
AST: 20 U/L (ref 15–41)
BUN: 8 mg/dL (ref 6–20)
CHLORIDE: 105 mmol/L (ref 101–111)
CO2: 22 mmol/L (ref 22–32)
CREATININE: 0.85 mg/dL (ref 0.44–1.00)
Calcium: 9.4 mg/dL (ref 8.9–10.3)
GFR calc non Af Amer: >60 mL/min (ref >60)
GLUCOSE: 162 mg/dL — AB (ref 65–99)
Potassium: 3.6 mmol/L (ref 3.5–5.1)
Sodium: 138 mmol/L (ref 135–145)
Total Bilirubin: 0.4 mg/dL (ref 0.3–1.2)
Total Protein: 8.2 g/dL — ABNORMAL HIGH (ref 6.5–8.1)

## 2015-05-11 LAB — CBC
HEMATOCRIT: 34.5 % — AB (ref 36.0–46.0)
HEMOGLOBIN: 11.3 g/dL — AB (ref 12.0–15.0)
MCH: 24.2 pg — ABNORMAL LOW (ref 26.0–34.0)
MCHC: 32.8 g/dL (ref 30.0–36.0)
MCV: 73.9 fL — ABNORMAL LOW (ref 78.0–100.0)
Platelets: 268 10*3/uL (ref 150–400)
RBC: 4.67 MIL/uL (ref 3.87–5.11)
RDW: 15.4 % (ref 11.5–15.5)
WBC: 7.3 10*3/uL (ref 4.0–10.5)

## 2015-05-11 LAB — RAPID URINE DRUG SCREEN, HOSP PERFORMED
AMPHETAMINES: NOT DETECTED
BARBITURATES: NOT DETECTED
Benzodiazepines: NOT DETECTED
COCAINE: NOT DETECTED
OPIATES: NOT DETECTED
TETRAHYDROCANNABINOL: NOT DETECTED

## 2015-05-11 LAB — ETHANOL: Alcohol, Ethyl (B): <5 mg/dL (ref <5)

## 2015-05-11 LAB — SALICYLATE LEVEL

## 2015-05-11 LAB — ACETAMINOPHEN LEVEL

## 2015-05-11 MED ORDER — NICOTINE 21 MG/24HR TD PT24: 21.0000 mg | MEDICATED_PATCH | Freq: Every day | TRANSDERMAL | Status: DC | PRN

## 2015-05-11 MED ORDER — FLUOXETINE HCL 10 MG PO CAPS
10.0000 mg | ORAL_CAPSULE | Freq: Every day | ORAL | Status: DC
  Filled 2015-05-11: qty 1

## 2015-05-11 MED ORDER — ACETAMINOPHEN 325 MG PO TABS: 650.0000 mg | ORAL_TABLET | ORAL | Status: DC | PRN

## 2015-05-11 MED ORDER — TRAZODONE HCL 50 MG PO TABS
50.0000 mg | ORAL_TABLET | Freq: Every evening | ORAL | Status: DC | PRN
  Administered 2015-05-11: 50 mg via ORAL
  Filled 2015-05-11 (×8): qty 1

## 2015-05-11 MED ORDER — ZOLPIDEM TARTRATE 5 MG PO TABS: 5.0000 mg | ORAL_TABLET | Freq: Every evening | ORAL | Status: DC | PRN

## 2015-05-11 MED ORDER — ONDANSETRON HCL 4 MG PO TABS: 4.0000 mg | ORAL_TABLET | Freq: Three times a day (TID) | ORAL | Status: DC | PRN

## 2015-05-11 MED ORDER — ZIPRASIDONE HCL 40 MG PO CAPS
40.0000 mg | ORAL_CAPSULE | Freq: Every day | ORAL | Status: DC
  Filled 2015-05-11 (×2): qty 1

## 2015-05-11 MED ORDER — IBUPROFEN 200 MG PO TABS: 600.0000 mg | ORAL_TABLET | Freq: Three times a day (TID) | ORAL | Status: DC | PRN

## 2015-05-11 MED ORDER — ZIPRASIDONE HCL 20 MG PO CAPS: 40.0000 mg | ORAL_CAPSULE | Freq: Every day | ORAL | Status: DC

## 2015-05-11 MED ORDER — ALUM & MAG HYDROXIDE-SIMETH 200-200-20 MG/5ML PO SUSP: 30.0000 mL | ORAL | Status: DC | PRN

## 2015-05-11 MED ORDER — FLUOXETINE HCL 10 MG PO CAPS
10.0000 mg | ORAL_CAPSULE | Freq: Every day | ORAL | Status: DC
  Filled 2015-05-11 (×2): qty 1

## 2015-05-11 MED ORDER — HYDROXYZINE HCL 25 MG PO TABS: 25.0000 mg | ORAL_TABLET | Freq: Four times a day (QID) | ORAL | Status: DC | PRN

## 2015-05-11 NOTE — ED Notes (Signed)
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

## 2015-05-11 NOTE — Progress Notes (Signed)
Writer introduced self to pt. Writer offered pt scheduled Geodon and provided education.  Pt refused med and would like to discuss med options with psychiatrist before starting any new meds. Pt verbalized to writer that she would like to be started on a mood stabilizer instead of an antidepressant or antipsychotic.

## 2015-05-11 NOTE — Progress Notes (Signed)
Tasha Barrett is a 25 year old female being admitted voluntarily to [facility] from [facility].  She came to the ED for suicidal ideation with no plan or intent.  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  She has a history of suicide attempts in the past and was hospitalized at [facility] September 2016 for OD.  She denies HI or A/V hallucinations.  She hasn't been taking her medications as prescribed but sees a therapist [facility].  She reports symptoms of poor sleep, fatigue, worthless feelings and crying spells.  She denies any drug or alcohol abuse.  She quit her job today because she stated that she was going "to be fired anyway."  She was very angry during the assessment but was able to calm down after talking and eating supper.  Admission paperwork completed and signed.  Belongings searched and secured in locker # 46 (tan & black coat, black charger, white charger, cell phone with grey and white case, set of keys, kindle device in case brown colored wallet with NCDL, [employer] student card, [employer], [phone] bills, 1-$10.00 bill, $2.84 in change and all money in truliant bank envelope).  Skin assessment completed and noted tattoo right shoulder blade, tattoo right arm and breast reduction surgery scars .  Q 15 minute checks initiated for safety.  We will monitor the progress towards her goals.

## 2015-05-11 NOTE — ED Notes (Signed)
Olivette, RN in pt room obtaining VS. This nurse in room to inform pt that she has been accepted to inpatient behavior health.  Pt presents with irritability, defensive. Pt reports that since she is voluntary she can just be discharged home. Pt referring to Dr. Loni Muse as "What's his face" Pt reporting she requested a mood stabilizer and "Dr. Tyson Alias his face"  Is giving her an antidepressant, that she does not need. Pt reports Emergency room MD told her she could go home and now we want to keep her.

## 2015-05-11 NOTE — ED Notes (Signed)
[facility] at facility to transfer pt to [facility] per MD order. Pt signed for personal belongings, belongings given to [employer] for transport. Pt ambulatory out of facility.

## 2015-05-11 NOTE — ED Provider Notes (Signed)
A shin is alert and talkative Glasgow Coma Score 15. She stated that she would rather die than be at work. She states she just quit her job "a few minutes ago" she does admit to feeling suicidal until she quit her job. Patient involuntarily committed by me for psychiatric evaluation she is felt to be a flight risk Results for orders placed or performed during the hospital encounter of 05/11/15  Comprehensive metabolic panel  Result Value Ref Range   Sodium 138 135 - 145 mmol/L   Potassium 3.6 3.5 - 5.1 mmol/L   Chloride 105 101 - 111 mmol/L   CO2 22 22 - 32 mmol/L   Glucose, Bld 162 (H) 65 - 99 mg/dL   BUN 8 6 - 20 mg/dL   Creatinine, Ser 0.85 0.44 - 1.00 mg/dL   Calcium 9.4 8.9 - 10.3 mg/dL   Total Protein 8.2 (H) 6.5 - 8.1 g/dL   Albumin 3.7 3.5 - 5.0 g/dL   AST 20 15 - 41 U/L   ALT 14 14 - 54 U/L   Alkaline Phosphatase 75 38 - 126 U/L   Total Bilirubin 0.4 0.3 - 1.2 mg/dL   GFR calc non Af Amer >60 >60 mL/min   GFR calc Af Amer >60 >60 mL/min   Anion gap 11 5 - 15  Ethanol (ETOH)  Result Value Ref Range   Alcohol, Ethyl (B) <5 <5 mg/dL  Salicylate level  Result Value Ref Range   Salicylate Lvl 123456 2.8 - 30.0 mg/dL  Acetaminophen level  Result Value Ref Range   Acetaminophen (Tylenol), Serum <10 (L) 10 - 30 ug/mL  CBC  Result Value Ref Range   WBC 7.3 4.0 - 10.5 K/uL   RBC 4.67 3.87 - 5.11 MIL/uL   Hemoglobin 11.3 (L) 12.0 - 15.0 g/dL   HCT 34.5 (L) 36.0 - 46.0 %   MCV 73.9 (L) 78.0 - 100.0 fL   MCH 24.2 (L) 26.0 - 34.0 pg   MCHC 32.8 30.0 - 36.0 g/dL   RDW 15.4 11.5 - 15.5 %   Platelets 268 150 - 400 K/uL  Urine rapid drug screen (hosp performed) (Not at [facility])  Result Value Ref Range   Opiates NONE DETECTED NONE DETECTED   Cocaine NONE DETECTED NONE DETECTED   Benzodiazepines NONE DETECTED NONE DETECTED   Amphetamines NONE DETECTED NONE DETECTED   Tetrahydrocannabinol NONE DETECTED NONE DETECTED   Barbiturates NONE DETECTED NONE DETECTED   No results  found.   Orlie Dakin, MD 05/11/15 [phone]

## 2015-05-11 NOTE — ED Notes (Signed)
Pt states that she has felt more stress and has felt unsafe at work. States she has suicidal ideation but no plan. Called suicide help line that told her to come in. Alert and oriented. Not taking any meds x several months.

## 2015-05-11 NOTE — BH Assessment (Addendum)
Assessment Note  Tasha Barrett is an 25 y.o. female presenting to [facility] voluntarily for suicidal ideations with no intent or plan due to work stress. 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 Patient states that she contacted the suicide hotline who encouraged her to come into the ED for an evaluation. Patient states that her most recent stressor is work. Patient states that she has attempted suicide three times in the past with the last time being in September of 2016 when she overdosed and was hospitalized at [facility]. Patient states that this attempt was triggered by a combination of work and post partum depression. Patient denies self injurious behaviors. Patient states that her mother committed suicide when she was 74 years old. Patient states that her "family" is supportive but declined to provide further details on family members. Patient denies HI and history of being violent towards others. Patient denies pending charges, upcoming court dates, and active probation. Patient denies history of abuse. Patient states that she has a one year old daughter who motivates her. Patient states that her daughter is currently safe with the father at this time.  Patient denies AVH and does not appear to be responding to internal stimuli.   Patient is alert and oriented x4. Patient is calm and cooperative during the assessment. Patient is pleasant and answers questions appropriately. Patient reports that she sees Dr. De Nurse and Carol Ada at [facility] and has been a patient since September 2016. Patient states that her last therapy appointment was Friday and she cannot recall her last medication management appointment. Patient states that she has been prescribed Abilify and Lamictal but does not take it as prescribed.  Patient states that she cannot recall the last dose that she took of her prescribed medications.  Patient states that she has had "several" hospitalizations in her lifetime that were "too many to count" but states that her last hospitalization was September 2016 due to an overdose. Patient endorses symptoms of depression as; insomnia, fatigue, feeling worthless "at times," tearfulness,  And lost of interest in pleasurable activities. Patient states that she has not taken a bath "since Thursday" due to feelings of depression. Patient denies use of drugs and alcohol. Patient BAL <5 at time of assessment and UDS needs to be collected.  Patient states that being here has helped her realize that she may need to quit her job as she states that she would no longer be suicidal if she did not have to go to work.   Consulted with Patriciaann Clan, PA-C who recommends patient be evaluated by psychiatry for final disposition.   Diagnosis: Bipolar Disorder   Past Medical History:  Past Medical History  Diagnosis Date  . Bipolar 2 disorder ([place])   . Pneumonia     age 37   . Bipolar disorder ([place])   . Depression   . Anxiety     Used Latuda (did not work)  . Yeast infection     Past Surgical History  Procedure Laterality Date  . Cesarean section N/A 03/09/2014    Procedure: CESAREAN SECTION;  Surgeon: Donnamae Jude, MD;  Location: [facility];  Service: Obstetrics;  Laterality: N/A;    Family History:  Family History  Problem Relation Age of Onset  . Anesthesia problems Neg Hx   .  Hypotension Neg Hx   . Malignant hyperthermia Neg Hx   . Pseudochol deficiency Neg Hx   . Depression Mother   . Gout Father   . Hypertension Father   . Gout Paternal Uncle   . Gout Paternal Grandfather     Social History:  reports that she has never smoked. She has never used smokeless tobacco. She reports that she does not drink alcohol or use illicit drugs.  Additional Social History:  Alcohol / Drug Use Pain Medications:  See PTA Prescriptions: See PTA Over the Counter: See PTA History of alcohol / drug use?: No history of alcohol / drug abuse  CIWA: CIWA-Ar BP: 106/58 mmHg Pulse Rate: 86 COWS:    Allergies:  Allergies  Allergen Reactions  . Amoxicillin-Pot Clavulanate Anaphylaxis and Other (See Comments)    Reaction childhood Has patient had a PCN reaction causing immediate rash, facial/tongue/throat swelling, SOB or lightheadedness with hypotension: Has patient had a PCN reaction causing severe rash involving mucus membranes or skin necrosis:  Has patient had a PCN reaction that required hospitalization  Has patient had a PCN reaction occurring within the last 10 years:  If all of the above answers are "NO", then may proceed with Cephalosporin use.   . Latex     Possible yeast infection.    Home Medications:  (Not in a hospital admission)  OB/GYN Status:  Patient's last menstrual period was 03/21/2015.  General Assessment Data Location of Assessment: [facility] TTS Assessment: In system Is this a Tele or Face-to-Face Assessment?: Face-to-Face Is this an Initial Assessment or a Re-assessment for this encounter?: Initial Assessment Marital status: Single Is patient pregnant?: No Pregnancy Status: No Living Arrangements: Children Can pt return to current living arrangement?: Yes Admission Status: Voluntary Is patient capable of signing voluntary admission?: Yes Referral Source: Self/Family/Friend     Crisis Care Plan Living Arrangements: Children Name of Psychiatrist: Dr. De Nurse Name of Therapist: Elisabeth Cara  Education Status Is patient currently in school?: No Highest grade of school patient has completed: BA  Risk to self with the past 6 months Suicidal Ideation: Yes-Currently Present Has patient been a risk to self within the past 6 months prior to admission? : No Suicidal Intent: No Has patient had any suicidal intent within the past 6 months prior to admission? : No Is patient  at risk for suicide?: No Suicidal Plan?: No Has patient had any suicidal plan within the past 6 months prior to admission? : No Access to Means: No What has been your use of drugs/alcohol within the last 12 months?: Denies Previous Attempts/Gestures: Yes How many times?: 3 (last time overdose 10/2014) Other Self Harm Risks: Denies Triggers for Past Attempts: Other (Comment) (work) Production assistant, radio Injurious Behavior: None Family Suicide History: Yes (mother when she was 35) Recent stressful life event(s): Other (Comment) (Work) Persecutory voices/beliefs?: No Depression: Yes Depression Symptoms: Insomnia, Tearfulness, Fatigue, Loss of interest in usual pleasures Substance abuse history and/or treatment for substance abuse?: No Suicide prevention information given to non-admitted patients: Not applicable  Risk to Others within the past 6 months Homicidal Ideation: No Does patient have any lifetime risk of violence toward others beyond the six months prior to admission? : No Thoughts of Harm to Others: No Current Homicidal Intent: No Current Homicidal Plan: No Access to Homicidal Means: No Identified Victim: Denies History of harm to others?: No Assessment of Violence: None Noted Violent Behavior Description: Denies Does patient have access to weapons?: No Criminal Charges Pending?: No Does  patient have a court date: No Is patient on probation?: No  Psychosis Hallucinations: None noted Delusions: None noted  Mental Status Report Appearance/Hygiene: In scrubs Eye Contact: Good Motor Activity: Freedom of movement Speech: Logical/coherent Level of Consciousness: Alert Mood: Pleasant Affect: Appropriate to circumstance Anxiety Level: None Thought Processes: Coherent, Relevant Judgement: Unimpaired Orientation: Person, Place, Time, Situation, Appropriate for developmental age Obsessive Compulsive Thoughts/Behaviors: None  Cognitive Functioning Concentration:  Decreased Memory: Recent Intact, Remote Intact IQ: Average Insight: Good Impulse Control: Good Appetite: Fair Sleep: Decreased Total Hours of Sleep: 4 Vegetative Symptoms: Not bathing  ADLScreening [facility] Assessment Services) Patient's cognitive ability adequate to safely complete daily activities?: Yes Patient able to express need for assistance with ADLs?: Yes Independently performs ADLs?: Yes (appropriate for developmental age)  Prior Inpatient Therapy Prior Inpatient Therapy: Yes Prior Therapy Dates: Multiple Prior Therapy Facilty/Provider(s): Multiple Reason for Treatment: SI  Prior Outpatient Therapy Prior Outpatient Therapy: Yes Prior Therapy Dates: 10/2014- Present Prior Therapy Facilty/Provider(s): [facility] Reason for Treatment: Bipolar Does patient have an ACCT team?: No Does patient have Intensive In-House Services?  : No Does patient have [facility] services? : No Does patient have P4CC services?: No  ADL Screening (condition at time of admission) Patient's cognitive ability adequate to safely complete daily activities?: Yes Is the patient deaf or have difficulty hearing?: No Does the patient have difficulty seeing, even when wearing glasses/contacts?: No Does the patient have difficulty concentrating, remembering, or making decisions?: No Patient able to express need for assistance with ADLs?: Yes Does the patient have difficulty dressing or bathing?: No Independently performs ADLs?: Yes (appropriate for developmental age) Does the patient have difficulty walking or climbing stairs?: No Weakness of Legs: None Weakness of Arms/Hands: None  Home Assistive Devices/Equipment Home Assistive Devices/Equipment: None  Therapy Consults (therapy consults require a physician order) PT Evaluation Needed: No OT Evalulation Needed: No SLP Evaluation Needed: No Abuse/Neglect Assessment (Assessment to be complete while patient is alone) Physical Abuse:  Denies Verbal Abuse: Yes, past (Comment) (in childhood) Sexual Abuse: Denies Exploitation of patient/patient's resources: Denies Self-Neglect: Denies Values / Beliefs Cultural Requests During Hospitalization: None Spiritual Requests During Hospitalization: None Consults Spiritual Care Consult Needed: No Social Work Consult Needed: No Regulatory affairs officer (For Healthcare) Does patient have an advance directive?: No Would patient like information on creating an advanced directive?: Yes Higher education careers adviser given    Additional Information 1:1 In Past 12 Months?: No CIRT Risk: No Elopement Risk: No Does patient have medical clearance?: No     Disposition:  Disposition Initial Assessment Completed for this Encounter: Yes Disposition of Patient: Other dispositions (AM Psych Eval per Patriciaann Clan, PA-C) Other disposition(s): Other (Comment)  On Site Evaluation by:   Reviewed with Physician:    Rogerio Boutelle 05/11/2015 7:00 AM

## 2015-05-11 NOTE — ED Notes (Signed)
MD at bedside, delayed in blood work.

## 2015-05-11 NOTE — ED Notes (Signed)
Patient admits to [facility] with no plan and denies HI and AVH at this time. Plan of care discussed and patient voices no complaints or concerns at this time. Patient verbally contracts for safety while on unit. Encouragement and support provided and safety maintain. Q 15 min safety checks remain in place.

## 2015-05-11 NOTE — Tx Team (Signed)
Initial Interdisciplinary Treatment Plan   PATIENT STRESSORS: Occupational concerns   PATIENT STRENGTHS: Average or above average intelligence Capable of independent living General fund of knowledge   PROBLEM LIST: Problem List/Patient Goals Date to be addressed Date deferred Reason deferred Estimated date of resolution  Suicidal ideation 05/11/15     Depression 05/11/15     "Get my medication regulated " 05/11/15     "Put plan in place for finding a job" 05/11/15                                    DISCHARGE CRITERIA:  Need for constant or close observation no longer present Verbal commitment to aftercare and medication compliance  PRELIMINARY DISCHARGE PLAN: Outpatient therapy Medication management  PATIENT/FAMIILY INVOLVEMENT: This treatment plan has been presented to and reviewed with the patient, Tasha Barrett.  The patient and family have been given the opportunity to ask questions and make suggestions.  Tasha Barrett 05/11/2015, 6:18 PM

## 2015-05-11 NOTE — BH Assessment (Addendum)
[facility] Assessment Progress Note  Per Corena Pilgrim, MD, this pt requires psychiatric hospitalization at this time.  Mack, RN, [facility] has assigned pt to Rm 400-2.  Waldon Merl, TS agrees to have pt signed Voluntary Admission and Consent for Treatment and Consent to Release Information.  Pt's nurse, Caryl Pina, has been notified, and agrees to send original paperwork along with pt via [facility] Pries, and to call report to [phone].  Jalene Mullet, MA Triage Specialist [phone]    Addendum:  When voluntary admission documents were presented to the pt, she agreed to sign them.  However, her demeanor makes it uncertain whether she will comply with the terms of the voluntary admission.  Pt's nurse, Caryl Pina, as well as Marlou Porch are concerned that pt may present an elopement risk if she is transported to [facility] via [facility].  These concerns have been brought to the attention of EDP Orlie Dakin, MD, and after examining the pt he concurs with this opinion and has decided to initiate IVC.  IVC documents have been faxed to [facility], and at 15:29 Rica Koyanagi confirms receipt.  Service of Findings and Custody Order is pending at this time.  Petition and 1st Examination have been faxed to [facility].  Pt will be transported via [facility].  Pt's nurse, Caryl Pina, has been notified.  Jalene Mullet, [place] Triage Specialist [phone]

## 2015-05-11 NOTE — ED Provider Notes (Signed)
CSN: [id number]     Arrival date & time 05/11/15  [id number] History   By signing my name below, I, Forrestine Him, attest that this documentation has been prepared under the direction and in the presence of Daleen Bo, MD.  Electronically Signed: Forrestine Him, ED Scribe. 05/11/2015. 3:27 AM.   Chief Complaint  Patient presents with  . Suicidal   The history is provided by the patient. No language interpreter was used.   HPI Comments: Tasha Barrett is a 25 y.o. female with a PMHx of bipolar 2 disorder, depression and anxiety who presents to the Emergency Department here for a suicidal ideation this evening. Pt has had a stressful time with work. 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 She reports worsening anxiety that is exacerbated when thinking about work. She also reports constant ongoing depression. Pt has been off of work for several days, but also states "if I don't quit my job, suicide is the only option." prior to arrival pt contacted the suicide hotline and was advised to come to the Emergency Department for further evaluation. She admits to attempting suicide in October of 2016 by overdose on OTC medications, iron and cough medicine. Pt also is prescribed Lamictal and Abilify. However she admits to being non compliant with these medications in the last several months due to issues with insurance. 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 Pt is followed by a therapist and a psychiatrist, however she has been unable to see either consistently due to insurance issues and financial strain. She denies any illicit drug use or alcohol consumption.   Past Medical History  Diagnosis Date  . Bipolar 2 disorder ([place])   . Pneumonia     age 54   . Bipolar disorder ([place])   .  Depression   . Anxiety     Used Latuda (did not work)  . Yeast infection    Past Surgical History  Procedure Laterality Date  . Cesarean section N/A 03/09/2014    Procedure: CESAREAN SECTION;  Surgeon: Donnamae Jude, MD;  Location: [facility];  Service: Obstetrics;  Laterality: N/A;   Family History  Problem Relation Age of Onset  . Anesthesia problems Neg Hx   . Hypotension Neg Hx   . Malignant hyperthermia Neg Hx   . Pseudochol deficiency Neg Hx   . Depression Mother   . Gout Father   . Hypertension Father   . Gout Paternal Uncle   . Gout Paternal Grandfather    Social History  Substance Use Topics  . Smoking status: Never Smoker   . Smokeless tobacco: Never Used  . Alcohol Use: No   OB History    Gravida Para Term Preterm AB TAB SAB Ectopic Multiple Living   1 1 1       0 1     Review of Systems  Constitutional: Negative for fever and chills.  Respiratory: Negative for cough.   Neurological: Negative for headaches.  Psychiatric/Behavioral: Positive for suicidal ideas and dysphoric mood.  All other systems reviewed and are negative.     Allergies  Amoxicillin-pot clavulanate and Latex  Home Medications   Prior to Admission medications   Medication Sig Start Date End Date Taking? Authorizing Provider  ARIPiprazole (ABILIFY) 10 MG tablet Take 1 tablet (  10 mg total) by mouth daily. Patient not taking: Reported on 04/25/2015 04/04/15   Emeterio Reeve, DO  busPIRone (BUSPAR) 7.5 MG tablet TAKE 1 TABLET (7.5 MG TOTAL) BY MOUTH DAILY AS NEEDED. Patient not taking: Reported on 04/25/2015 02/11/15   Merian Capron, MD  lamoTRIgine (LAMICTAL) 150 MG tablet Take 1 tablet (150 mg total) by mouth daily. Patient not taking: Reported on 04/25/2015 02/11/15   Merian Capron, MD  ondansetron (ZOFRAN) 4 MG tablet Take 1 tablet (4 mg total) by mouth every 6 (six) hours. Patient not taking: Reported on 05/11/2015 04/12/15   Noland Fordyce, PA-C   BP 94/65 mmHg  Pulse 110  Temp(Src) 98.2 F  (36.8 C) (Oral)  Resp 18  SpO2 100%  LMP 03/21/2015 Physical Exam  Constitutional: She is oriented to person, place, and time. She appears well-developed and well-nourished.  HENT:  Head: Normocephalic and atraumatic.  Eyes: Conjunctivae and EOM are normal. Pupils are equal, round, and reactive to light.  Neck: Normal range of motion and phonation normal. Neck supple.  Cardiovascular: Normal rate and regular rhythm.   Pulmonary/Chest: Effort normal and breath sounds normal. She exhibits no tenderness.  Abdominal: Soft. She exhibits no distension. There is no tenderness. There is no guarding.  Musculoskeletal: Normal range of motion.  Neurological: She is alert and oriented to person, place, and time. She exhibits normal muscle tone.  Skin: Skin is warm and dry.  Psychiatric: She has a normal mood and affect. Her behavior is normal. Judgment and thought content normal.  Nursing note and vitals reviewed.   ED Course  Procedures (including critical care time)  DIAGNOSTIC STUDIES: Oxygen Saturation is 100% on RA, normal by my interpretation.    COORDINATION OF CARE: 2:59 AM-Will order blood work. Discussed treatment plan with pt at bedside and pt agreed to plan.   Medications  acetaminophen (TYLENOL) tablet 650 mg (not administered)  ibuprofen (ADVIL,MOTRIN) tablet 600 mg (not administered)  zolpidem (AMBIEN) tablet 5 mg (not administered)  nicotine (NICODERM CQ - dosed in mg/24 hours) patch 21 mg (not administered)  ondansetron (ZOFRAN) tablet 4 mg (not administered)  alum & mag hydroxide-simeth (MAALOX/MYLANTA) 200-200-20 MG/5ML suspension 30 mL (not administered)    Patient Vitals for the past 24 hrs:  BP Temp Temp src Pulse Resp SpO2  05/11/15 0243 94/65 mmHg 98.2 F (36.8 C) Oral 110 18 100 %    5:24 AM Reevaluation with update and discussion. After initial assessment and treatment, an updated evaluation reveals No change in clinical status. She is medically cleared for  treatment by psychiatry services. Eathon Valade L   TTS Consultation  Labs Review Labs Reviewed  COMPREHENSIVE METABOLIC PANEL - Abnormal; Notable for the following:    Glucose, Bld 162 (*)    Total Protein 8.2 (*)    All other components within normal limits  ACETAMINOPHEN LEVEL - Abnormal; Notable for the following:    Acetaminophen (Tylenol), Serum <10 (*)    All other components within normal limits  CBC - Abnormal; Notable for the following:    Hemoglobin 11.3 (*)    HCT 34.5 (*)    MCV 73.9 (*)    MCH 24.2 (*)    All other components within normal limits  ETHANOL  SALICYLATE LEVEL  URINE RAPID DRUG SCREEN, HOSP PERFORMED    Imaging Review No results found. I have personally reviewed and evaluated these images and lab results as part of my medical decision-making.   EKG Interpretation None  MDM   Final diagnoses:  None    Suicidal ideation, without a plan. She has history of bipolar disorder but does not appear acutely psychotic. She is apparently noncompliant with the current medications, because of financial difficulty.  Nursing Notes Reviewed/ Care Coordinated, and agree without changes. Applicable Imaging Reviewed.  Interpretation of Laboratory Data incorporated into ED treatment  Plan as per TTS, in conjunction with oncoming provider team  I personally performed the services described in this documentation, which was scribed in my presence. The recorded information has been reviewed and is accurate.    Daleen Bo, MD 05/11/15 [phone]

## 2015-05-12 DIAGNOSIS — F331 Major depressive disorder, recurrent, moderate: Principal | ICD-10-CM

## 2015-05-12 DIAGNOSIS — F3132 Bipolar disorder, current episode depressed, moderate: Secondary | ICD-10-CM | POA: Insufficient documentation

## 2015-05-12 MED ORDER — LAMOTRIGINE 150 MG PO TABS
150.0000 mg | ORAL_TABLET | Freq: Every day | ORAL | Status: DC
  Administered 2015-05-12 – 2015-05-13 (×2): 150 mg via ORAL
  Filled 2015-05-12 (×4): qty 1

## 2015-05-12 MED ORDER — ARIPIPRAZOLE 10 MG PO TABS
10.0000 mg | ORAL_TABLET | Freq: Every day | ORAL | Status: DC
  Administered 2015-05-12 – 2015-05-13 (×2): 10 mg via ORAL
  Filled 2015-05-12 (×5): qty 1

## 2015-05-12 NOTE — Progress Notes (Signed)
Pt is new to the unit this afternoon.  She has been in her room most of the evening.  She is upset that she had to be admitted here, but she is cooperative with staff.  She is somewhat irritable in conversation.  She does not want to take any meds she is not familiar with until she can talk with the MD tomorrow.  Writer did speak to her about having Trazodone available for sleep.  She reports she has taken the med before and is ok with taking it tonight.  Pt denies SI/HI/AVH.  She wants to be put on a mood stabilizer and be discharged as soon as possible.  Pt makes her needs known to staff.  Support and encouragement offered.  Discharge plans are in process.  Safety maintained with q15 minute checks.

## 2015-05-12 NOTE — Progress Notes (Signed)
Adult Psychoeducational Group Note  Date:  05/12/2015 Time:  8:56 PM  Group Topic/Focus:  Wrap-Up Group:   The focus of this group is to help patients review their daily goal of treatment and discuss progress on daily workbooks.  Participation Level:  Did Not Attend  Participation Quality:  Did not attend  Affect:  Did not attend  Cognitive:  Did not attend  Insight: None  Engagement in Group:  Did not attend  Modes of Intervention:  Did not attend  Additional Comments:  Patient did not attend wrap up group tonight.   Laquisha Northcraft L Ammon Muscatello 05/12/2015, 8:56 PM

## 2015-05-12 NOTE — Progress Notes (Signed)
Urine cup given to pt. Waiting on return
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
## 2015-05-12 NOTE — BHH Group Notes (Signed)
[facility] Mental Health Association Group Therapy 05/12/2015 1:15pm  Type of Therapy: Mental Health Association Presentation  Participation Level: Active  Participation Quality: Attentive  Affect: Appropriate  Cognitive: Oriented  Insight: Developing/Improving  Engagement in Therapy: Engaged  Modes of Intervention: Discussion, Education and Socialization  Summary of Progress/Problems: Mental Health Association ([facility]) Speaker came to talk about his personal journey with substance abuse and addiction. The pt processed ways by which to relate to the speaker. Century speaker provided handouts and educational information pertaining to groups and services offered by the [facility]. Pt was engaged in speaker's presentation and was receptive to resources provided.    Peri Maris, LCSWA 05/12/2015 1:35 PM

## 2015-05-12 NOTE — Progress Notes (Signed)
Patient ID: Tasha Barrett, female   DOB: August 04, 1990, 25 y.o.   MRN: [id number] D: client in bed most of the evening, up for medications. Client reports "here to get medication regulated" "I was getting more depressed" "day been pretty good" A: Writer provided emotional support, reviewed medications, administered medications as ordered. Staff will monitor q96min for safety. R: Client is safe on the unit.
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
## 2015-05-12 NOTE — Tx Team (Addendum)
Interdisciplinary Treatment Plan Update (Adult)  Date:  05/12/2015   Time Reviewed:  1:05 PM   Progress in Treatment: Attending groups: Yes. Participating in groups:  Yes. Taking medication as prescribed:  Yes. Tolerating medication:  Yes. Family/Significant other contact made:  No Patient understands diagnosis:  Yes  As evidenced by seeking help with "my depression." Discussing patient identified problems/goals with staff:  Yes, see initial care plan. Medical problems stabilized or resolved:  Yes. Denies suicidal/homicidal ideation: Yes. Issues/concerns per patient self-inventory:  No. Other:  New problem(s) identified:  Discharge Plan or Barriers: see below  Reason for Continuation of Hospitalization: Anxiety Depression Medication stabilization  Comments: Pt states that she has felt more stress and has felt unsafe at work. States she has suicidal ideation but no plan. Called suicide help line that told her to come in. Alert and oriented. Not taking any meds x several months. Abilify, Tegretol trial  Estimated length of stay: 2-4 days  New goal(s):  Review of initial/current patient goals per problem list:   Review of initial/current patient goals per problem list:  1. Goal(s): Patient will participate in aftercare plan   Met:    Target date: 3-5 days post admission date   As evidenced by: Patient will participate within aftercare plan AEB aftercare provider and housing plan at discharge being identified. 05/12/15:  Return home, follow up outpt   2. Goal (s): Patient will exhibit decreased depressive symptoms and suicidal ideations.   Met: No   Target date: 3-5 days post admission date   As evidenced by: Patient will utilize self rating of depression at 3 or below and demonstrate decreased signs of depression or be deemed stable for discharge by MD. 05/12/15:  Rates her depression a 5 today.  Denies SI     3. Goal(s): Patient will demonstrate decreased  signs and symptoms of anxiety.   Met: No   Target date: 3-5 days post admission date   As evidenced by: Patient will utilize self rating of anxiety at 3 or below and demonstrated decreased signs of anxiety, or be deemed stable for discharge by MD 05/12/15:  Rates anxiety a 4 today    Attendees: Patient:  05/12/2015 1:05 PM   Family:   05/12/2015 1:05 PM   Physician:  Neita Garnet, MD 05/12/2015 1:05 PM   Nursing:   Darrol Angel, RN 05/12/2015 1:05 PM   CSW:    Roque Lias, LCSW   05/12/2015 1:05 PM   Other:  05/12/2015 1:05 PM   Other:   05/12/2015 1:05 PM   Other:  Lars Pinks, Nurse CM 05/12/2015 1:05 PM   Other:   05/12/2015 1:05 PM   Other:  Norberto Sorenson, [place]  05/12/2015 1:05 PM   Other:  05/12/2015 1:05 PM   Other:  05/12/2015 1:05 PM   Other:  05/12/2015 1:05 PM   Other:  05/12/2015 1:05 PM   Other:  05/12/2015 1:05 PM   Other:   05/12/2015 1:05 PM    Scribe for Treatment Team:   Trish Mage, 05/12/2015 1:05 PM

## 2015-05-12 NOTE — Progress Notes (Signed)
D: Pt presents with flat affect and depressed mood. Pt rates depression 5/10. Anxiety 4/10. Pt denies suicidal thoughts. Pt reports fair sleep and appetite. Pt refused morning dose of Prozac and requesting to sit down with the doctor and discuss med options. Pt verbalized that she's willing to take a mood stabilizer but do not feel the  need to be on an antidepressant at this time.  A: Medications reviewed with pt. Medications administered as ordered per MD. Verbal support provided. Pt encouraged to attend groups. 15 minute checks performed for safety.  R: Pt receptive to tx.
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
## 2015-05-13 LAB — LIPID PANEL
Cholesterol: 143 mg/dL (ref 0–200)
HDL: 40 mg/dL — AB (ref >40)
LDL Cholesterol: 87 mg/dL (ref 0–99)
TRIGLYCERIDES: 79 mg/dL (ref <150)
Total CHOL/HDL Ratio: 3.6 RATIO
VLDL: 16 mg/dL (ref 0–40)

## 2015-05-13 LAB — PREGNANCY, URINE: PREG TEST UR: NEGATIVE

## 2015-05-13 LAB — TSH: TSH: 1.587 u[IU]/mL (ref 0.350–4.500)

## 2015-05-13 MED ORDER — TRAZODONE HCL 50 MG PO TABS: 50.0000 mg | ORAL_TABLET | Freq: Every evening | ORAL | Status: DC | PRN

## 2015-05-13 MED ORDER — ARIPIPRAZOLE 10 MG PO TABS: 10.0000 mg | ORAL_TABLET | Freq: Every day | ORAL | Status: DC

## 2015-05-13 MED ORDER — HYDROXYZINE HCL 25 MG PO TABS: 25.0000 mg | ORAL_TABLET | Freq: Four times a day (QID) | ORAL | Status: DC | PRN

## 2015-05-13 MED ORDER — LAMOTRIGINE 150 MG PO TABS: 150.0000 mg | ORAL_TABLET | Freq: Every day | ORAL | Status: DC

## 2015-05-13 NOTE — BHH Group Notes (Signed)
[facility] LCSW Aftercare Discharge Planning Group Note  05/13/2015 8:45 AM  Participation Quality: Alert, Appropriate and Oriented  Mood/Affect: Appropriate  Depression Rating: 0  Anxiety Rating: 0  Thoughts of Suicide: Pt denies SI/HI  Will you contract for safety? Yes  Current AVH: Pt denies  Plan for Discharge/Comments: Pt attended discharge planning group and actively participated in group. CSW discussed suicide prevention education with the group and encouraged them to discuss discharge planning and any relevant barriers. Pt reports that she feels better this morning and is hopeful for discharge.  Transportation Means: Pt reports access to transportation  Supports: No supports mentioned at this time  Peri Maris, [place] 05/13/2015 9:25 AM

## 2015-05-13 NOTE — Tx Team (Signed)
Interdisciplinary Treatment Plan Update (Adult)  Date:  05/13/2015   Time Reviewed:  10:05 AM   Progress in Treatment: Attending groups: Yes. Participating in groups:  Yes. Taking medication as prescribed:  Yes. Tolerating medication:  Yes. Family/Significant other contact made:  No Patient understands diagnosis:  Yes  As evidenced by seeking help with "my depression." Discussing patient identified problems/goals with staff:  Yes, see initial care plan. Medical problems stabilized or resolved:  Yes. Denies suicidal/homicidal ideation: Yes. Issues/concerns per patient self-inventory:  No. Other:  New problem(s) identified:  Discharge Plan or Barriers: see below  Reason for Continuation of Hospitalization: Anxiety Depression Medication stabilization  Comments: Pt states that she has felt more stress and has felt unsafe at work. States she has suicidal ideation but no plan. Called suicide help line that told her to come in. Alert and oriented. Not taking any meds x several months. Abilify, Tegretol trial  Estimated length of stay: 0 days  New goal(s):  Review of initial/current patient goals per problem list:   Review of initial/current patient goals per problem list:  1. Goal(s): Patient will participate in aftercare plan   Met: Yes   Target date: 3-5 days post admission date   As evidenced by: Patient will participate within aftercare plan AEB aftercare provider and housing plan at discharge being identified. 05/12/15:  Return home, follow up outpt   2. Goal (s): Patient will exhibit decreased depressive symptoms and suicidal ideations.   Met: Yes   Target date: 3-5 days post admission date   As evidenced by: Patient will utilize self rating of depression at 3 or below and demonstrate decreased signs of depression or be deemed stable for discharge by MD. 05/12/15:  Rates her depression a 5 today.  Denies SI 05/13/15: Pt rates depression at 0/10; denies  SI     3. Goal(s): Patient will demonstrate decreased signs and symptoms of anxiety.   Met: Yes   Target date: 3-5 days post admission date   As evidenced by: Patient will utilize self rating of anxiety at 3 or below and demonstrated decreased signs of anxiety, or be deemed stable for discharge by MD 05/12/15:  Rates anxiety a 4 today 05/13/15: Rates anxiety 0/10    Attendees: Patient:  05/13/2015 10:05 AM   Family:   05/13/2015 10:05 AM   Physician:  Neita Garnet, MD 05/13/2015 10:05 AM   Nursing:   Sandre Kitty, RN 05/13/2015 10:05 AM   CSW:    Peri Maris, LCSW   05/13/2015 10:05 AM   Other:  05/13/2015 10:05 AM   Other:   05/13/2015 10:05 AM   Other:  Lars Pinks, Nurse CM 05/13/2015 10:05 AM   Other:   05/13/2015 10:05 AM   Other:   05/13/2015 10:05 AM   Other:  05/13/2015 10:05 AM   Other:  05/13/2015 10:05 AM   Other:  05/13/2015 10:05 AM   Other:  05/13/2015 10:05 AM   Other:  05/13/2015 10:05 AM   Other:   05/13/2015 10:05 AM    Scribe for Treatment Team:   Peri Maris, [place] [employer] [phone]
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
## 2015-05-13 NOTE — BHH Suicide Risk Assessment (Signed)
[place] INPATIENT:  Family/Significant Other Suicide Prevention Education  Suicide Prevention Education:  Contact Attempts: Blimie Bachand, Pt's father [phone], (name of family member/significant other) has been identified by the patient as the family member/significant other with whom the patient will be residing, and identified as the person(s) who will aid the patient in the event of a mental health crisis.  With written consent from the patient, two attempts were made to provide suicide prevention education, prior to and/or following the patient's discharge.  We were unsuccessful in providing suicide prevention education.  A suicide education pamphlet was given to the patient to share with family/significant other.  Date and time of first attempt: 05/12/15 @ 5:05pm Date and time of second attempt: 05/13/15 @ 12:40pm  Bo Mcclintock 05/13/2015, 12:44 PM

## 2015-05-13 NOTE — Progress Notes (Signed)
Pt. D/C from the unit to pick up her car from [place].  Security notified and they will pick her up and transport her to her car.  She was pleasant and cooperative. She voiced no SI/HI.  Alert and oriented X 4.  Affect bright.  D/C follow up paperwork reviewed with pt and copy sent as well as prescriptions.  Belongings (from locker 46-coat, chargers, cell phone, keys, kindle, wallet with NCDL and money in [employer] bank envelope-$92.84 total) returned to pt. Q 15 min checks maintained until discharge.  She left the unit in no apparent distress.

## 2015-05-13 NOTE — Progress Notes (Signed)
  [facility] Adult Case Management Discharge Plan :  Will you be returning to the same living situation after discharge: Yes, Pt returning home At discharge, do you have transportation home?: Yes, Pt car is at the hospital. Do you have the ability to pay for your medications: Yes, Pt provided with prescriptions  Release of information consent forms completed and in the chart;  Patient's signature needed at discharge.  Patient to Follow up at: Follow-up Information    Follow up with [facility] On 05/18/2015.   Specialty:  Behavioral Health   Why:  at 8:00 with [facility] for therapy and on 4/21 for medication management.    Contact information:   [address] [place] [phone]      Next level of care provider has access to [place] and Suicide Prevention discussed: Yes, with Pt; 2 unsucessful contact attempts with father  Have you used any form of tobacco in the last 30 days? (Cigarettes, Smokeless Tobacco, Cigars, and/or Pipes): No  Has patient been referred to the Quitline?: N/A patient is not a smoker  Patient has been referred for addiction treatment: N/A  Bo Mcclintock 05/13/2015, 12:55 PM

## 2015-05-13 NOTE — Progress Notes (Deleted)
  [facility] Adult Case Management Discharge Plan :  Will you be returning to the same living situation after discharge:  Yes,  Pt returning home At discharge, do you have transportation home?: Yes,  Pt car is at the hospital. Do you have the ability to pay for your medications: Yes,  Pt provided with prescriptions  Release of information consent forms completed and in the chart;  Patient's signature needed at discharge.  Patient to Follow up at: Follow-up Information    Follow up with [facility] On 05/18/2015.   Specialty:  Behavioral Health   Why:  Wednesday at 8:00 with [facility] for therapy.   Contact information:   [address] [place] [phone]      Next level of care provider has access to [place] and Suicide Prevention discussed: Yes,  with Pt; 2 unsucessful contact attempts with father  Have you used any form of tobacco in the last 30 days? (Cigarettes, Smokeless Tobacco, Cigars, and/or Pipes): No  Has patient been referred to the Quitline?: N/A patient is not a smoker  Patient has been referred for addiction treatment: N/A  Bo Mcclintock 05/13/2015, 12:45 PM

## 2015-05-14 LAB — PROLACTIN: PROLACTIN: 26 ng/mL — AB (ref 4.8–23.3)

## 2015-05-14 LAB — HEMOGLOBIN A1C
Hgb A1c MFr Bld: 5.6 % (ref 4.8–5.6)
Mean Plasma Glucose: 114 mg/dL

## 2015-05-17 ENCOUNTER — Ambulatory Visit (HOSPITAL_COMMUNITY): Payer: Self-pay | Admitting: Licensed Clinical Social Worker

## 2015-05-18 ENCOUNTER — Ambulatory Visit (INDEPENDENT_AMBULATORY_CARE_PROVIDER_SITE_OTHER): Payer: 59 | Admitting: Licensed Clinical Social Worker

## 2015-05-18 DIAGNOSIS — F411 Generalized anxiety disorder: Secondary | ICD-10-CM | POA: Diagnosis not present

## 2015-05-18 DIAGNOSIS — F603 Borderline personality disorder: Secondary | ICD-10-CM | POA: Diagnosis not present

## 2015-05-19 NOTE — Progress Notes (Signed)
   THERAPIST PROGRESS NOTE  Session Time: 8:10am-9:05am  Participation Level: Active  Behavioral Response: Casual Alert Euthymic  Type of Therapy: Individual Therapy  Treatment Goals addressed: emotion regulation   Interventions: Solution focused therapy  Suicidal/Homicidal: Denied both  Therapist Interventions:  Learned about circumstances leading up to her making a decision to pursue [facility] hospitalization.  Explored how patient's outlook has changed since she quit her job.  Discussed how low self-esteem has contributed as a significant barrier to successfully pursuing and achieving goals and how this is one thing patient would like to change.                           Summary:  Approximately one week ago patient decided to get an evaluation to see if she needed to be hospitalized.  She was having suicidal ideation at the time, but denies having a plan or intent.  Reported that the stress from her job had become too much to handle.  Decided to quit her job the day she was admitted.  Reported feeling a great sense of relief afterwards.  Indicated that she is looking forward to pursuing other interests.   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  Also noted that the job required her to maintain a certain level of organization that she simply wasn't able to keep up with.  Able to acknowledge that the job was not a good fit rather than evidence of her being a failure. Identified a couple of interests she would like to devote more time to doing: writing and singing.  Talked about her ongoing struggles to pursue these interests without being overly critical of herself.                 Plan: Will be meeting with this therapist for one more session.            Diagnosis:      Borderline Personality Disorder  Generalized Anxiety Disorder Bipolar Disorder  Armandina Stammer 05/18/2015

## 2015-05-25 ENCOUNTER — Encounter: Payer: Self-pay | Admitting: Osteopathic Medicine

## 2015-05-25 ENCOUNTER — Ambulatory Visit (INDEPENDENT_AMBULATORY_CARE_PROVIDER_SITE_OTHER): Payer: 59 | Admitting: Osteopathic Medicine

## 2015-05-25 VITALS — BP 134/85 | HR 98 | Temp 98.3°F | Wt 227.0 lb

## 2015-05-25 DIAGNOSIS — J029 Acute pharyngitis, unspecified: Secondary | ICD-10-CM

## 2015-05-25 DIAGNOSIS — J020 Streptococcal pharyngitis: Secondary | ICD-10-CM

## 2015-05-25 LAB — POCT RAPID STREP A (OFFICE): RAPID STREP A SCREEN: POSITIVE — AB

## 2015-05-25 MED ORDER — CLINDAMYCIN HCL 300 MG PO CAPS: 300.0000 mg | ORAL_CAPSULE | Freq: Three times a day (TID) | ORAL | Status: DC

## 2015-05-25 MED ORDER — METHYLPREDNISOLONE 4 MG PO TBPK: ORAL_TABLET | ORAL | Status: DC

## 2015-05-25 MED ORDER — LIDOCAINE VISCOUS 2 % MT SOLN: 15.0000 mL | OROMUCOSAL | Status: DC | PRN

## 2015-05-25 NOTE — Progress Notes (Signed)
HPI: Tasha Barrett is a 25 y.o. female who presents to [facility]  today for chief complaint of:  Chief Complaint  Patient presents with  . Sore Throat   ACUTE ILLNESS . Location: sore throat . Quality: scratchy, pain . Assoc signs/symptoms: see ROS . Duration: 2 days   Past medical, social and family history reviewed. Current medications and allergies reviewed.     Review of Systems: CONSTITUTIONAL: no fever/chills HEAD/EYES/EARS/NOSE/THROAT: no headache, no vision change or hearing change, yes sore throat CARDIAC: No chest pain/pressure/palpitations RESPIRATORY: yes cough, no shortness of breath GASTROINTESTINAL: no nausea, no vomiting, no abdominal pain, no diarrhea MUSCULOSKELETAL: no myalgia/arthralgia   Exam:  BP 134/85 mmHg  Pulse 98  Temp(Src) 98.3 F (36.8 C) (Oral)  Wt 227 lb (102.967 kg)  LMP 04/18/2015 Constitutional: VSS, see above. General Appearance: alert, well-developed, well-nourished, NAD Eyes: Normal lids and conjunctive, non-icteric sclera, PERRLA Ears, Nose, Mouth, Throat: Normal external inspection ears/nares/mouth/lips/gums, normal TM, MMM;       posterior pharynx with erythema, with exudate Neck: No masses, trachea midline. abnormal - tender ant cervical lymph nodes but not enlarged Respiratory: Normal respiratory effort. No  wheeze/rhonchi/rales Cardiovascular: S1/S2 normal, no murmur/rub/gallop auscultated. RRR.  MODIFIED CENTOR CRITERIA (ponts if "yes"): Tonsillar exudate (1): yes Tender Ant Cervical LN (1): yes Absence of cough (1): no Fever (1): no Age  80-14 (1): no 15-45 (0): yes >/= 45 (-1): no SCORE: 2   No results found for this or any previous visit (from the past 72 hour(s)). No results found.   ASSESSMENT/PLAN:  Streptococcal sore throat - Plan: clindamycin (CLEOCIN) 300 MG capsule  Acute pharyngitis, unspecified etiology - Plan: POCT rapid strep A, lidocaine (XYLOCAINE) 2 %  solution, methylPREDNISolone (MEDROL DOSEPAK) 4 MG TBPK tablet    Return if symptoms worsen or fail to improve.

## 2015-05-25 NOTE — Patient Instructions (Signed)

## 2015-05-27 ENCOUNTER — Ambulatory Visit (HOSPITAL_COMMUNITY): Payer: Self-pay | Admitting: Psychiatry

## 2015-06-02 ENCOUNTER — Ambulatory Visit (HOSPITAL_COMMUNITY): Payer: Self-pay | Admitting: Licensed Clinical Social Worker

## 2015-06-03 ENCOUNTER — Ambulatory Visit (HOSPITAL_COMMUNITY): Payer: Self-pay | Admitting: Licensed Clinical Social Worker

## 2015-06-06 ENCOUNTER — Ambulatory Visit (HOSPITAL_COMMUNITY): Payer: Self-pay | Admitting: Psychiatry

## 2015-06-14 ENCOUNTER — Ambulatory Visit (INDEPENDENT_AMBULATORY_CARE_PROVIDER_SITE_OTHER): Payer: BC Managed Care – PPO | Admitting: Psychiatry

## 2015-06-14 ENCOUNTER — Encounter (HOSPITAL_COMMUNITY): Payer: Self-pay | Admitting: Psychiatry

## 2015-06-14 VITALS — BP 128/72 | HR 75 | Ht 62.0 in | Wt 233.0 lb

## 2015-06-14 DIAGNOSIS — F3132 Bipolar disorder, current episode depressed, moderate: Secondary | ICD-10-CM | POA: Diagnosis not present

## 2015-06-14 DIAGNOSIS — F411 Generalized anxiety disorder: Secondary | ICD-10-CM | POA: Diagnosis not present

## 2015-06-14 DIAGNOSIS — F53 Postpartum depression: Secondary | ICD-10-CM

## 2015-06-14 DIAGNOSIS — F603 Borderline personality disorder: Secondary | ICD-10-CM

## 2015-06-14 DIAGNOSIS — O99345 Other mental disorders complicating the puerperium: Secondary | ICD-10-CM

## 2015-06-14 MED ORDER — ARIPIPRAZOLE 10 MG PO TABS: 10.0000 mg | ORAL_TABLET | Freq: Every day | ORAL | Status: DC

## 2015-06-14 MED ORDER — LAMOTRIGINE 150 MG PO TABS: 150.0000 mg | ORAL_TABLET | Freq: Every day | ORAL | Status: DC
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
## 2015-06-24 ENCOUNTER — Encounter (HOSPITAL_BASED_OUTPATIENT_CLINIC_OR_DEPARTMENT_OTHER): Payer: Self-pay

## 2015-06-24 ENCOUNTER — Emergency Department (HOSPITAL_BASED_OUTPATIENT_CLINIC_OR_DEPARTMENT_OTHER)
Admission: EM | Admit: 2015-06-24 | Discharge: 2015-06-24 | Disposition: A | Discharge status: Home / Self Care | Payer: BC Managed Care – PPO | Attending: Emergency Medicine | Admitting: Emergency Medicine

## 2015-06-24 DIAGNOSIS — F319 Bipolar disorder, unspecified: Secondary | ICD-10-CM | POA: Insufficient documentation

## 2015-06-24 DIAGNOSIS — B379 Candidiasis, unspecified: Secondary | ICD-10-CM

## 2015-06-24 DIAGNOSIS — L298 Other pruritus: Secondary | ICD-10-CM | POA: Diagnosis present

## 2015-06-24 MED ORDER — TERCONAZOLE 80 MG VA SUPP: 80.0000 mg | Freq: Every day | VAGINAL | Status: DC

## 2015-06-24 NOTE — ED Notes (Signed)
Pt c/o vaginal itching and yeast infection for the last several days.  She has tried monistat without any relief.

## 2015-06-24 NOTE — ED Notes (Signed)
Pt c/o vaginal itching and discharge for the last several days, feels she has a yeast infection and has tried monistat without it helping.  Pt denies any concerns for STDs

## 2015-06-24 NOTE — ED Notes (Signed)
MD at bedside. 

## 2015-06-24 NOTE — Discharge Instructions (Signed)
Monilial Vaginitis Tasha Barrett, use the suppository for 3 days for treatment. See your primary care doctor within 3 days for close follow up. If symptoms worsen, come back to the ED immediately. Thank you. Vaginitis in a soreness, swelling and redness (inflammation) of the vagina and vulva. Monilial vaginitis is not a sexually transmitted infection. CAUSES  Yeast vaginitis is caused by yeast (candida) that is normally found in your vagina. With a yeast infection, the candida has overgrown in number to a point that upsets the chemical balance. SYMPTOMS   White, thick vaginal discharge.  Swelling, itching, redness and irritation of the vagina and possibly the lips of the vagina (vulva).  Burning or painful urination.  Painful intercourse. DIAGNOSIS  Things that may contribute to monilial vaginitis are:  Postmenopausal and virginal states.  Pregnancy.  Infections.  Being tired, sick or stressed, especially if you had monilial vaginitis in the past.  Diabetes. Good control will help lower the chance.  Birth control pills.  Tight fitting garments.  Using bubble bath, feminine sprays, douches or deodorant tampons.  Taking certain medications that kill germs (antibiotics).  Sporadic recurrence can occur if you become ill. TREATMENT  Your caregiver will give you medication.  There are several kinds of anti monilial vaginal creams and suppositories specific for monilial vaginitis. For recurrent yeast infections, use a suppository or cream in the vagina 2 times a week, or as directed.  Anti-monilial or steroid cream for the itching or irritation of the vulva may also be used. Get your caregiver's permission.  Painting the vagina with methylene blue solution may help if the monilial cream does not work.  Eating yogurt may help prevent monilial vaginitis. HOME CARE INSTRUCTIONS   Finish all medication as prescribed.  Do not have sex until treatment is completed or after your  caregiver tells you it is okay.  Take warm sitz baths.  Do not douche.  Do not use tampons, especially scented ones.  Wear cotton underwear.  Avoid tight pants and panty hose.  Tell your sexual partner that you have a yeast infection. They should go to their caregiver if they have symptoms such as mild rash or itching.  Your sexual partner should be treated as well if your infection is difficult to eliminate.  Practice safer sex. Use condoms.  Some vaginal medications cause latex condoms to fail. Vaginal medications that harm condoms are:  Cleocin cream.  Butoconazole (Femstat).  Terconazole (Terazol) vaginal suppository.  Miconazole (Monistat) (may be purchased over the counter). SEEK MEDICAL CARE IF:   You have a temperature by mouth above 102 F (38.9 C).  The infection is getting worse after 2 days of treatment.  The infection is not getting better after 3 days of treatment.  You develop blisters in or around your vagina.  You develop vaginal bleeding, and it is not your menstrual period.  You have pain when you urinate.  You develop intestinal problems.  You have pain with sexual intercourse.   This information is not intended to replace advice given to you by your health care provider. Make sure you discuss any questions you have with your health care provider.   Document Released: 11/01/2004 Document Revised: 04/16/2011 Document Reviewed: 07/26/2014 Elsevier Interactive Patient Education [employer].

## 2015-06-24 NOTE — ED Notes (Signed)
Pt verbalizes understanding of d/c instructions and denies any further needs at this time. 

## 2015-06-24 NOTE — ED Provider Notes (Signed)
CSN: [id number]     Arrival date & time 06/24/15  0422 History   First MD Initiated Contact with Patient 06/24/15 [phone]     Chief Complaint  Patient presents with  . Vaginal Itching     (Consider location/radiation/quality/duration/timing/severity/associated sxs/prior Treatment) HPI  Tasha Barrett is a 25 y.o. female with past medical history of yeast infections presenting today with a recurrent yeast infection. Patient states she's having significant vaginal irritation. This occurred 2 years ago and cannot be treated with Monistat or Diflucan. She has tried Diflucan at home but it is made her symptoms worse. She denies any vaginal discharge, irregular vaginal bleeding, dysuria or hematuria. She denies any possibility of STD, she states this is consistent with increased infection. Patient is requesting treatment. There are no further complaints.  10 Systems reviewed and are negative for acute change except as noted in the HPI.    Past Medical History  Diagnosis Date  . Bipolar 2 disorder ([place])   . Pneumonia     age 62   . Bipolar disorder ([place])   . Depression   . Anxiety     Used Latuda (did not work)  . Yeast infection    Past Surgical History  Procedure Laterality Date  . Cesarean section N/A 03/09/2014    Procedure: CESAREAN SECTION;  Surgeon: Donnamae Jude, MD;  Location: [facility];  Service: Obstetrics;  Laterality: N/A;   Family History  Problem Relation Age of Onset  . Anesthesia problems Neg Hx   . Hypotension Neg Hx   . Malignant hyperthermia Neg Hx   . Pseudochol deficiency Neg Hx   . Depression Mother   . Gout Father   . Hypertension Father   . Gout Paternal Uncle   . Gout Paternal Grandfather    Social History  Substance Use Topics  . Smoking status: Never Smoker   . Smokeless tobacco: Never Used  . Alcohol Use: No   OB History    Gravida Para Term Preterm AB TAB SAB Ectopic Multiple Living   1 1 1       0 1     Review of Systems    Allergies   Amoxicillin-pot clavulanate and Latex  Home Medications   Prior to Admission medications   Medication Sig Start Date End Date Taking? Authorizing Provider  ARIPiprazole (ABILIFY) 10 MG tablet Take 1 tablet (10 mg total) by mouth daily. For mood control 06/14/15   Merian Capron, MD  clindamycin (CLEOCIN) 300 MG capsule Take 1 capsule (300 mg total) by mouth 3 (three) times daily. 05/25/15   Emeterio Reeve, DO  lamoTRIgine (LAMICTAL) 150 MG tablet Take 1 tablet (150 mg total) by mouth daily. For mood stabilization 06/14/15   Merian Capron, MD  lidocaine (XYLOCAINE) 2 % solution Use as directed 15 mLs in the mouth or throat every 3 (three) hours as needed (for sore throat - gargle and spit). 05/25/15   Emeterio Reeve, DO  methylPREDNISolone (MEDROL DOSEPAK) 4 MG TBPK tablet 6-day pack as directed 05/25/15   Emeterio Reeve, DO  terconazole (TERAZOL 3) 80 MG vaginal suppository Place 1 suppository (80 mg total) vaginally at bedtime. 06/24/15   Everlene Balls, MD   BP 123/67 mmHg  Pulse 82  Temp(Src) 98.5 F (36.9 C) (Oral)  Resp 18  Ht 5\' 2"  (1.575 m)  Wt 235 lb (106.595 kg)  BMI 42.97 kg/m2  SpO2 98%  LMP 06/09/2015 (Exact Date) Physical Exam  Constitutional: She is oriented to person, place, and time.  She appears well-developed and well-nourished. No distress.  HENT:  Head: Normocephalic and atraumatic.  Nose: Nose normal.  Mouth/Throat: Oropharynx is clear and moist. No oropharyngeal exudate.  Eyes: Conjunctivae and EOM are normal. Pupils are equal, round, and reactive to light. No scleral icterus.  Neck: Normal range of motion. Neck supple. No JVD present. No tracheal deviation present. No thyromegaly present.  Cardiovascular: Normal rate, regular rhythm and normal heart sounds.  Exam reveals no gallop and no friction rub.   No murmur heard. Pulmonary/Chest: Effort normal and breath sounds normal. No respiratory distress. She has no wheezes. She exhibits no tenderness.  Abdominal:  Soft. Bowel sounds are normal. She exhibits no distension and no mass. There is no tenderness. There is no rebound and no guarding.  Musculoskeletal: Normal range of motion. She exhibits no edema or tenderness.  Lymphadenopathy:    She has no cervical adenopathy.  Neurological: She is alert and oriented to person, place, and time. No cranial nerve deficit. She exhibits normal muscle tone.  Skin: Skin is warm and dry. No rash noted. No erythema. No pallor.  Nursing note and vitals reviewed.   ED Course  Procedures (including critical care time) Labs Review Labs Reviewed - No data to display  Imaging Review No results found. I have personally reviewed and evaluated these images and lab results as part of my medical decision-making.   EKG Interpretation None      MDM   Final diagnoses:  Yeast infection  Patient presents emergency department for yeast infection. She states she's had a very resistant yeast infection to normal therapy. She was given terconazole suppositories to use for the next 3 days. Primary care follow-up advised. She appears well and in no acute distress, vital signs remain within her normal limits and she is safe for discharge.  Everlene Balls, MD 06/24/15 [phone]

## 2015-06-27 ENCOUNTER — Ambulatory Visit (INDEPENDENT_AMBULATORY_CARE_PROVIDER_SITE_OTHER): Payer: BC Managed Care – PPO | Admitting: Physician Assistant

## 2015-06-27 ENCOUNTER — Encounter: Payer: Self-pay | Admitting: Physician Assistant

## 2015-06-27 ENCOUNTER — Telehealth (HOSPITAL_COMMUNITY): Payer: Self-pay | Admitting: Psychiatry

## 2015-06-27 VITALS — BP 137/53 | HR 89 | Ht 62.0 in | Wt 235.0 lb

## 2015-06-27 DIAGNOSIS — N898 Other specified noninflammatory disorders of vagina: Secondary | ICD-10-CM | POA: Diagnosis not present

## 2015-06-27 DIAGNOSIS — N3001 Acute cystitis with hematuria: Secondary | ICD-10-CM | POA: Diagnosis not present

## 2015-06-27 DIAGNOSIS — L298 Other pruritus: Secondary | ICD-10-CM | POA: Diagnosis not present

## 2015-06-27 LAB — POCT URINALYSIS DIPSTICK
BILIRUBIN UA: NEGATIVE
GLUCOSE UA: NEGATIVE
KETONES UA: NEGATIVE
NITRITE UA: NEGATIVE
PH UA: 7
Protein, UA: NEGATIVE
SPEC GRAV UA: 1.02
Urobilinogen, UA: 0.2

## 2015-06-27 LAB — WET PREP FOR TRICH, YEAST, CLUE
Clue Cells Wet Prep HPF POC: NONE SEEN
Trich, Wet Prep: NONE SEEN
Yeast Wet Prep HPF POC: NONE SEEN

## 2015-06-27 MED ORDER — CIPROFLOXACIN HCL 500 MG PO TABS: 500.0000 mg | ORAL_TABLET | Freq: Two times a day (BID) | ORAL | Status: DC

## 2015-06-27 MED ORDER — TERCONAZOLE 80 MG VA SUPP: 80.0000 mg | Freq: Every day | VAGINAL | Status: DC

## 2015-06-27 MED ORDER — FLUCONAZOLE 150 MG PO TABS: ORAL_TABLET | ORAL | Status: DC

## 2015-06-28 ENCOUNTER — Encounter: Payer: Self-pay | Admitting: Physician Assistant

## 2015-06-28 DIAGNOSIS — R7303 Prediabetes: Secondary | ICD-10-CM | POA: Insufficient documentation

## 2015-06-28 DIAGNOSIS — R319 Hematuria, unspecified: Secondary | ICD-10-CM | POA: Insufficient documentation

## 2015-06-28 DIAGNOSIS — N898 Other specified noninflammatory disorders of vagina: Secondary | ICD-10-CM | POA: Insufficient documentation

## 2015-06-28 LAB — HEMOGLOBIN A1C
HEMOGLOBIN A1C: 5.9 % — AB (ref <5.7)
MEAN PLASMA GLUCOSE: 123 mg/dL

## 2015-06-28 NOTE — Progress Notes (Signed)
   Subjective:    Patient ID: Tasha Barrett, female    DOB: [birth year], 25 y.o.   MRN: [id number]  HPI Pt presents to the clinic with ongoing vaginal itching, irritation and discharge. She was seen in ED last week and given terazol suppositories. Pt has a hx of resistant yeast infections that have not responded to diflucan in the past. She denies any abnominal pain, flank pain, dysuria, or fever. She has tried OTC monistat.    Review of Systems  All other systems reviewed and are negative.      Objective:   Physical Exam  Constitutional: She is oriented to person, place, and time. She appears well-developed and well-nourished.  HENT:  Head: Normocephalic and atraumatic.  Cardiovascular: Normal rate, regular rhythm and normal heart sounds.   Pulmonary/Chest: Effort normal and breath sounds normal.  No CVA tenderness.   Abdominal: Soft. Bowel sounds are normal. She exhibits no distension and no mass. There is no tenderness. There is no rebound and no guarding.  Genitourinary:  Copious amounts of white thick discharge.   Neurological: She is alert and oriented to person, place, and time.  Psychiatric: She has a normal mood and affect. Her behavior is normal.          Assessment & Plan:  Vaginal itching, irritation, discharge/Acute cystitis with hematuria- ordered UA to look for glucose in urine. Negative for glucose. UA dipstick positive for blood and leuks. Treated with cipro for 3 days. Wet prep negative for yeast or BV. However, she is on terazol suppositories that could affect results. Clinical exam showed yeast like discharge. Will treat with diflucan 1 po every 3 days for 3 doses. This should also help if she gets a yeast infection from abx. Will culture urine. Follow up if symptoms not improving.

## 2015-06-28 NOTE — Telephone Encounter (Signed)
Pt called for a 90 day supply for Abilify 10mg . Insurance will only cover the cost of a 90 day prescription.

## 2015-06-29 ENCOUNTER — Ambulatory Visit (INDEPENDENT_AMBULATORY_CARE_PROVIDER_SITE_OTHER): Payer: BC Managed Care – PPO | Admitting: Osteopathic Medicine

## 2015-06-29 ENCOUNTER — Encounter: Payer: Self-pay | Admitting: Osteopathic Medicine

## 2015-06-29 ENCOUNTER — Ambulatory Visit (HOSPITAL_COMMUNITY): Payer: BC Managed Care – PPO | Admitting: Licensed Clinical Social Worker

## 2015-06-29 ENCOUNTER — Ambulatory Visit: Payer: Self-pay | Admitting: Osteopathic Medicine

## 2015-06-29 VITALS — BP 101/68 | HR 91 | Ht 62.0 in | Wt 235.0 lb

## 2015-06-29 DIAGNOSIS — R7303 Prediabetes: Secondary | ICD-10-CM | POA: Insufficient documentation

## 2015-06-29 DIAGNOSIS — Z6841 Body Mass Index (BMI) 40.0 and over, adult: Secondary | ICD-10-CM

## 2015-06-29 DIAGNOSIS — F3162 Bipolar disorder, current episode mixed, moderate: Secondary | ICD-10-CM

## 2015-06-29 DIAGNOSIS — F411 Generalized anxiety disorder: Secondary | ICD-10-CM

## 2015-06-29 DIAGNOSIS — F603 Borderline personality disorder: Secondary | ICD-10-CM

## 2015-06-29 LAB — URINE CULTURE

## 2015-06-29 LAB — POCT GLYCOSYLATED HEMOGLOBIN (HGB A1C): Hemoglobin A1C: 5.5

## 2015-06-29 NOTE — Progress Notes (Signed)
HPI: Tasha Barrett is a 25 y.o. female who presents to [place] today for chief complaint of:  Chief Complaint  Patient presents with  . PRE-DIABETES    Patient recently seen in office for vaginal itching/discharge, at that visit she also requested an A1c be done. This came back elevated and she was told she has prediabetes and is here today with some questions about this diagnosis.  No history of diabetes in first-degree family members, no history of gestational diabetes. Patient admits that especially since having her child she has been eating out frequently, rarely cooking for herself at home, increased stress and pressed for time. She is interested in losing weight, better nutrition.  A1c repeated today in office is not in prediabetic range, however patient states that result 2 days ago was from a blood draw in the lab not a point of care test.   Patient is otherwise feeling well today and has no complaints.   Past medical, social and family history reviewed: Past Medical History  Diagnosis Date  . Bipolar 2 disorder ([place])   . Pneumonia     age 72   . Bipolar disorder ([place])   . Depression   . Anxiety     Used Latuda (did not work)  . Yeast infection    Past Surgical History  Procedure Laterality Date  . Cesarean section N/A 03/09/2014    Procedure: CESAREAN SECTION;  Surgeon: Donnamae Jude, MD;  Location: [facility];  Service: Obstetrics;  Laterality: N/A;   Social History  Substance Use Topics  . Smoking status: Never Smoker   . Smokeless tobacco: Never Used  . Alcohol Use: No   Family History  Problem Relation Age of Onset  . Anesthesia problems Neg Hx   . Hypotension Neg Hx   . Malignant hyperthermia Neg Hx   . Pseudochol deficiency Neg Hx   . Depression Mother   . Gout Father   . Hypertension Father   . Gout Paternal Uncle   . Gout Paternal Grandfather     Current Outpatient Prescriptions  Medication Sig Dispense Refill  .  ARIPiprazole (ABILIFY) 10 MG tablet Take 1 tablet (10 mg total) by mouth daily. For mood control 30 tablet 0  . ciprofloxacin (CIPRO) 500 MG tablet Take 1 tablet (500 mg total) by mouth 2 (two) times daily. 6 tablet 0  . fluconazole (DIFLUCAN) 150 MG tablet Take one tablet every 3 days for 3 doses. 3 tablet 0  . lamoTRIgine (LAMICTAL) 150 MG tablet Take 1 tablet (150 mg total) by mouth daily. For mood stabilization 90 tablet 0  . terconazole (TERAZOL 3) 80 MG vaginal suppository Place 1 suppository (80 mg total) vaginally at bedtime. 3 suppository 0  . [DISCONTINUED] traZODone (DESYREL) 50 MG tablet Take 1 tablet (50 mg total) by mouth at bedtime and may repeat dose one time if needed. For sleep 60 tablet 0   No current facility-administered medications for this visit.   Allergies  Allergen Reactions  . Amoxicillin-Pot Clavulanate Anaphylaxis and Other (See Comments)  . Latex Other (See Comments)    Possible yeast infection.      Review of Systems: CONSTITUTIONAL:  No  fever, no chills, (+) unintentional weight changes CARDIAC: No  chest pain RESPIRATORY: No  cough, No  shortness of breath/wheeze ENDOCRINE: No polyuria/polydipsia/polyphagia, No  heat/cold intolerance  PSYCHIATRIC: (+) concerns with depression, No  concerns with anxiety, No sleep problems - has appointment today at behavioral health however  her usual therapist is out on maternity leave, she does not feel 100% comfortable with the covering their best  Exam:  BP 101/68 mmHg  Pulse 91  Ht 5\' 2"  (1.575 m)  Wt 235 lb (106.595 kg)  BMI 42.97 kg/m2  LMP 06/09/2015 (Exact Date) Constitutional: VS see above. General Appearance: alert, well-developed, well-nourished, NAD Cardiovascular: S1/S2 normal, no murmur, no rub/gallop auscultated. RRR.   Psychiatric: Normal judgment/insight. Normal mood and affect. Oriented x3.    Results for orders placed or performed in visit on 06/29/15 (from the past 72 hour(s))  POCT HgB A1C      Status: None   Collection Time: 06/29/15 10:44 AM  Result Value Ref Range   Hemoglobin A1C 5.5       ASSESSMENT/PLAN: A1c isn't prediabetic range, patient educated extensively on lifestyle changes with regard to weight loss, increase cardiovascular fitness. We'll plan to repeat A1c in another 3 months. Patient is not interested in weight loss medication, at least not at this time. Patient will consider nutrition referral once to see if her insurance will cover this, would like to follow-up with me in another month or so for a weight check and to discuss this issue further.  Of note, patient is helping her behavioral therapy sessions until her normal therapist gets back from maternity leave, advised that she does need to continue with psychiatry, her bipolar is currently well controlled on Lamictal, this may be contributing at least somewhat to her weight gain, could consider decreased dose or alternative medication if she is really concerned about this.  Pre-diabetes - Plan: POCT HgB A1C  Obesity   All questions were answered. Visit summary with updated medication list and pertinent instructions was printed for patient. ER/RTC precautions were reviewed with the patient. Return in about 6 weeks (around 08/10/2015) for weight check - follow-up.  Total time spent 25 minutes, greater than 50% of the visit was face-to-face counseling and coordinating care for diagnosis of obesity and prediabetes.

## 2015-06-29 NOTE — Progress Notes (Signed)
   THERAPIST PROGRESS NOTE  Session Time: 9:51 AM-10:02 AM  Participation Level: Active  Behavioral Response: CasualAlertEuthymic  Type of Therapy: Individual Therapy  Treatment Goals addressed: Coping, Emotional Regulation  Interventions: Other: Build Therapeutic Rapport  Summary: Tasha Barrett is a 25 y.o. female who presents with wanting to talk about her relationship with food. She was just diagnosed as pre-diabetic.  She has a horrible relationship with food. She couldn't snack, so she would sneak and binge. As patient started to open up about her issue she stopped therapist and related that she was not comfortable with this therapist and preferred to continue with regular therapist. She expressed concerns that this therapist would not be here long-term so that she did not want to start with a new therapist at this time. This therapist supported and accepted client's wish to stay with regular therapist.    Suicidal/Homicidal: No  Therapist Response: Therapist attempted to establish therapeutic rapport but accepted and supported patient's wish to stay with regular therapist.   Plan: 1. Patient will continue therapy with regular therapist. .  Diagnosis: Axis I: Borderline Personality Disorder, Genearlized Anxiety Disorder, Bipolar Disorder    Axis II: n/a    Barbar Brede A, LCSW 06/29/2015

## 2015-06-30 NOTE — Telephone Encounter (Signed)
Return telephone call expressing concerns about the cost of medication. Per Dr. De Nurse, medication request for a 90 day supply of Abilify is denied. Pt will need to schedule an appt with the clinic. LVM for pt to return call to clinic to schedule appt.

## 2015-07-01 NOTE — Telephone Encounter (Signed)
Called and spoke with Tasha Barrett at [facility]. Per Tasha Barrett, pt was prescribed Abilify 10mg , #90 by primary care physician and pt picked up prescription on 06/27/15. Called and spoke with pt to schedule appt, pt confirmed pcp wrote rx for Abilify. Pt is schedule for a f/u appt on 09/07/15.

## 2015-08-10 ENCOUNTER — Ambulatory Visit (INDEPENDENT_AMBULATORY_CARE_PROVIDER_SITE_OTHER): Payer: 59 | Admitting: Osteopathic Medicine

## 2015-08-10 ENCOUNTER — Encounter: Payer: Self-pay | Admitting: Osteopathic Medicine

## 2015-08-10 VITALS — BP 114/70 | HR 93 | Ht 62.0 in | Wt 244.0 lb

## 2015-08-10 DIAGNOSIS — Z6841 Body Mass Index (BMI) 40.0 and over, adult: Secondary | ICD-10-CM

## 2015-08-10 DIAGNOSIS — M545 Low back pain, unspecified: Secondary | ICD-10-CM

## 2015-08-10 NOTE — Progress Notes (Signed)
HPI: Tasha Barrett is a 25 y.o. female who presents to [place] today for chief complaint of:  Chief Complaint  Patient presents with  . Follow-up    WEIGHT     Patient had some concerns about weight. She has actually gained weight since her last visit 6 weeks ago. She is not interested in starting medication for obesity, she has changed habits in terms of eating out less but notes increased stress due to loss of job, finances, weight is putting a strain on her relationships, she says that her fianc does not find her as attractive as he used to. She is trouble keeping up with her young daughter. She has not done anything in terms of calorie counting, she has started exercising a bit with walking.   Patient also notes bilateral low back pain, on and off for some time but getting worse over the past 2 weeks, worse with getting up from laying down or seated position. No radiation down legs, no numbness or tingling.   Past medical, social and family history reviewed: Past Medical History  Diagnosis Date  . Bipolar 2 disorder ([place])   . Pneumonia     age 75   . Bipolar disorder ([place])   . Depression   . Anxiety     Used Latuda (did not work)  . Yeast infection    Past Surgical History  Procedure Laterality Date  . Cesarean section N/A 03/09/2014    Procedure: CESAREAN SECTION;  Surgeon: Donnamae Jude, MD;  Location: [facility];  Service: Obstetrics;  Laterality: N/A;   Social History  Substance Use Topics  . Smoking status: Never Smoker   . Smokeless tobacco: Never Used  . Alcohol Use: No   Family History  Problem Relation Age of Onset  . Anesthesia problems Neg Hx   . Hypotension Neg Hx   . Malignant hyperthermia Neg Hx   . Pseudochol deficiency Neg Hx   . Depression Mother   . Gout Father   . Hypertension Father   . Gout Paternal Uncle   . Gout Paternal Grandfather     Current Outpatient Prescriptions  Medication Sig Dispense Refill  .  ARIPiprazole (ABILIFY) 10 MG tablet Take 1 tablet (10 mg total) by mouth daily. For mood control 30 tablet 0  . lamoTRIgine (LAMICTAL) 150 MG tablet Take 1 tablet (150 mg total) by mouth daily. For mood stabilization 90 tablet 0  . [DISCONTINUED] traZODone (DESYREL) 50 MG tablet Take 1 tablet (50 mg total) by mouth at bedtime and may repeat dose one time if needed. For sleep 60 tablet 0   No current facility-administered medications for this visit.   Allergies  Allergen Reactions  . Amoxicillin-Pot Clavulanate Anaphylaxis and Other (See Comments)  . Latex Other (See Comments)    Possible yeast infection.      Review of Systems: CONSTITUTIONAL:  No  fever, no chills, (+) unintentional weight changes CARDIAC: No  chest pain PSYCHIATRIC: (+) concerns with depression, No  concerns with anxiety, No sleep problems - bipolar well-controlled  Exam:  BP 114/70 mmHg  Pulse 93  Ht 5\' 2"  (1.575 m)  Wt 244 lb (110.678 kg)  BMI 44.62 kg/m2 Constitutional: VS see above. General Appearance: alert, well-developed, well-nourished, NAD Discussed skeletal: Bilateral paraspinal tenderness to lumbar spine/quadratus lumborum musculature, negative straight leg raise. See below for OMT intervention Psychiatric: Normal judgment/insight. Normal mood and affect. Oriented x3.    No results found for this or any  previous visit (from the past 72 hour(s)).    ASSESSMENT/PLAN:  Discussed calorie counting, using up to track her progress and to set goals. Patient is interested in support group for food addiction, I said this was probably reasonable in terms of getting support from other people who struggle with weight issues and with compulsive eating. Try to get fianc to help out as far as no planning/cooking.  Back pain, increased lumbar lordosis, poor posture, advised work on core muscle strength to help support spine, muscle energy/HVLA attempted to lower spine, minimally helpful   Morbid obesity with BMI  of 40.0-44.9, adult (HCC)   Bilateral low back pain without sciatica   All questions were answered. Visit summary with updated medication list and pertinent instructions was printed for patient. ER/RTC precautions were reviewed with the patient. No Follow-up on file.  Total time spent 25 minutes, greater than 50% of the visit was face-to-face counseling and coordinating care for diagnosis of obesity and back pain.

## 2015-09-07 ENCOUNTER — Ambulatory Visit (INDEPENDENT_AMBULATORY_CARE_PROVIDER_SITE_OTHER): Payer: 59 | Admitting: Psychiatry

## 2015-09-07 ENCOUNTER — Encounter (HOSPITAL_COMMUNITY): Payer: Self-pay | Admitting: Psychiatry

## 2015-09-07 VITALS — BP 128/76 | HR 96 | Ht 62.0 in | Wt 244.6 lb

## 2015-09-07 DIAGNOSIS — F603 Borderline personality disorder: Secondary | ICD-10-CM | POA: Diagnosis not present

## 2015-09-07 DIAGNOSIS — F411 Generalized anxiety disorder: Secondary | ICD-10-CM | POA: Diagnosis not present

## 2015-09-07 DIAGNOSIS — F314 Bipolar disorder, current episode depressed, severe, without psychotic features: Secondary | ICD-10-CM | POA: Diagnosis not present

## 2015-09-07 MED ORDER — LAMOTRIGINE 150 MG PO TABS: 150.0000 mg | ORAL_TABLET | Freq: Every day | ORAL | 0 refills | Status: DC
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
## 2015-09-13 ENCOUNTER — Ambulatory Visit (INDEPENDENT_AMBULATORY_CARE_PROVIDER_SITE_OTHER): Payer: 59 | Admitting: Licensed Clinical Social Worker

## 2015-09-13 DIAGNOSIS — F411 Generalized anxiety disorder: Secondary | ICD-10-CM | POA: Diagnosis not present

## 2015-09-13 DIAGNOSIS — F603 Borderline personality disorder: Secondary | ICD-10-CM | POA: Diagnosis not present
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
## 2015-09-22 ENCOUNTER — Other Ambulatory Visit: Payer: Self-pay | Admitting: Osteopathic Medicine

## 2015-09-22 DIAGNOSIS — F3170 Bipolar disorder, currently in remission, most recent episode unspecified: Secondary | ICD-10-CM

## 2015-09-27 ENCOUNTER — Ambulatory Visit (HOSPITAL_COMMUNITY): Payer: Self-pay | Admitting: Licensed Clinical Social Worker

## 2015-10-20 ENCOUNTER — Ambulatory Visit (INDEPENDENT_AMBULATORY_CARE_PROVIDER_SITE_OTHER): Payer: 59 | Admitting: Licensed Clinical Social Worker

## 2015-10-20 DIAGNOSIS — F3132 Bipolar disorder, current episode depressed, moderate: Secondary | ICD-10-CM | POA: Diagnosis not present

## 2015-10-20 DIAGNOSIS — F411 Generalized anxiety disorder: Secondary | ICD-10-CM

## 2015-10-20 DIAGNOSIS — F603 Borderline personality disorder: Secondary | ICD-10-CM | POA: Diagnosis not present

## 2015-10-20 NOTE — Progress Notes (Signed)
   THERAPIST PROGRESS NOTE  Session Time: 4:20am-5:15pm  Participation Level: Active  Behavioral Response: Casual Alert Anxious  Type of Therapy: Individual Therapy  Treatment Goals addressed: emotion regulation   Interventions: Cognitive behavioral  Suicidal/Homicidal: Denied both  Therapist Interventions:   Explored concerns related to poor self-esteem. Pointed out that despite her thoughts of worthlessness she has managed to obtain a new job.  Discussed how the job has the potential to help improve her self esteem. Explored where a lot of her negative thoughts about herself may have stemmed from.   Discussed how her low self-esteem has impacted her romantic relationships.    Recommended establishing care with a psychotherapist closer to where she will be living, emphasizing the importance of consistency of care.  Encouraged her to request sharing of records when she does start seeing another therapist.          Summary:   Reported "My self-esteem has crashed.  It's never been this bad.  It's like I have very little regard for what happens to me."  She moved in with her parents in [place].  Talked about how she is finding it difficult to maintain motivation when she doesn't have a daily schedule to follow.   Disclosed that she will be starting a new job on Monday.  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  It is in [place].  Planning to live temporarily with a cousin in [place].  Says she thinks this will be a better fit for her.   Reflected on how growing up and still today family members would make critical comments about her.  It was typically done in a joking manner, but she would interpret the comments as hurtful.  Acknowledged that her self-esteem would be better if she had received more validation of her feelings.   Reflected on the instability in her romantic relationships and attributed it in great part to her insecurities within  herself.  She said, "I have a hard time letting anybody love me because I don't love myself."  Reported that she does not want to stop seeing this therapist.  Dorthula Rue thinking about "starting over" with someone new.  Ultimately agreed that it doesn't make sense to continue psychotherapy with this therapist.  Expressed intentions of establishing care with a new therapist in the near future.        Plan:   No further sessions scheduled.         Diagnosis:     Borderline Personality Disorder  Generalized Anxiety Disorder Bipolar Disorder  Armandina Stammer 10/20/2015

## 2015-10-26 ENCOUNTER — Other Ambulatory Visit: Payer: Self-pay | Admitting: Osteopathic Medicine

## 2015-10-26 ENCOUNTER — Encounter (HOSPITAL_COMMUNITY): Payer: Self-pay | Admitting: *Deleted

## 2015-10-26 ENCOUNTER — Telehealth (HOSPITAL_COMMUNITY): Payer: Self-pay | Admitting: *Deleted

## 2015-10-26 ENCOUNTER — Encounter (HOSPITAL_COMMUNITY): Payer: Self-pay | Admitting: Psychiatry

## 2015-10-26 DIAGNOSIS — F3170 Bipolar disorder, currently in remission, most recent episode unspecified: Secondary | ICD-10-CM

## 2015-10-26 NOTE — Telephone Encounter (Signed)
Ms. Nier called in to request a 90-day med refill for Aripiprazole. She said she was completely out of meds. [facility] [place] [facility]. Thanks!

## 2015-10-26 NOTE — Telephone Encounter (Signed)
Pt called for a refill for Lamictal. Pt states she has relocate to [place], [place]. [facility] spoke w/ Suezanne Jacquet. Per Suezanne Jacquet, pt has a prescription for Lamictal 150mg , #90 ready for pick up.   Called and informed pt about refill status. Pt verbalizes understanding.

## 2015-10-28 ENCOUNTER — Other Ambulatory Visit: Payer: Self-pay

## 2015-10-28 DIAGNOSIS — F3170 Bipolar disorder, currently in remission, most recent episode unspecified: Secondary | ICD-10-CM

## 2015-10-28 MED ORDER — ARIPIPRAZOLE 10 MG PO TABS: 10.0000 mg | ORAL_TABLET | Freq: Every day | ORAL | 0 refills | Status: DC

## 2015-12-05 ENCOUNTER — Ambulatory Visit (HOSPITAL_COMMUNITY): Payer: Self-pay | Admitting: Psychiatry

## 2016-01-31 ENCOUNTER — Encounter: Payer: Self-pay | Admitting: Obstetrics & Gynecology

## 2016-01-31 ENCOUNTER — Ambulatory Visit (INDEPENDENT_AMBULATORY_CARE_PROVIDER_SITE_OTHER): Payer: 59 | Admitting: Obstetrics & Gynecology

## 2016-01-31 VITALS — Ht 62.0 in | Wt 247.0 lb

## 2016-01-31 DIAGNOSIS — Z113 Encounter for screening for infections with a predominantly sexual mode of transmission: Secondary | ICD-10-CM

## 2016-01-31 DIAGNOSIS — N938 Other specified abnormal uterine and vaginal bleeding: Secondary | ICD-10-CM | POA: Diagnosis not present

## 2016-01-31 NOTE — Progress Notes (Signed)
   Subjective:    Patient ID: Tasha Barrett, female    DOB: [birth year], 25 y.o.   MRN: [id number]  HPI 25 yo S AA P1 here with the issue of having 2 periods in about August 2017 and then monthly periods until this month when she has had 2 periods again. She is having unprotected IC with her FOB and having sex with 2 other fellows, using condoms with them.    Review of Systems     Objective:   Physical Exam Morbidly obese pleasant, teary BF Breathing, conversing, and ambulating normally Abd- benign Cervix- normal, moderate amount of BRVB No palpable masses       Assessment & Plan:  DUB- check TSH, cervical cultures, and gyn u/s

## 2016-02-01 ENCOUNTER — Ambulatory Visit (INDEPENDENT_AMBULATORY_CARE_PROVIDER_SITE_OTHER): Payer: 59

## 2016-02-01 ENCOUNTER — Other Ambulatory Visit: Payer: Self-pay

## 2016-02-01 DIAGNOSIS — N938 Other specified abnormal uterine and vaginal bleeding: Secondary | ICD-10-CM

## 2016-02-01 DIAGNOSIS — D259 Leiomyoma of uterus, unspecified: Secondary | ICD-10-CM | POA: Diagnosis not present

## 2016-02-01 LAB — TSH: TSH: 1.29 mIU/L

## 2016-02-02 ENCOUNTER — Telehealth: Payer: Self-pay

## 2016-02-02 LAB — GC/CHLAMYDIA PROBE AMP (~~LOC~~) NOT AT ARMC
CHLAMYDIA, DNA PROBE: NEGATIVE
Neisseria Gonorrhea: NEGATIVE

## 2016-02-02 NOTE — Telephone Encounter (Signed)
Pt called asking about her ultrasound, blood work and urine gc/chlamydia results. After speaking with Clovia Cuff, MD she said the U/S was normal other than some very small fibroids and the fibroids are not the reason for her pain. I told the pt this information and asked if she wanted a follow up appointment to go over the results with Dr. Hulan Fray. She declined the offer for a follow up appointment. I also told her that the results of her blood work (TSH) was within the normal range. I informed her that the results for her Urine GC/Chlamydia had not been sent to [place] and that I would give her a call when we receive the results.

## 2016-02-18 IMAGING — US US OB COMP +14 WK
1 series · 12 of 28 positions shown · non-contrast
Comparison: none

[Series 1: us ob comp +14 wk mfm · 117 acquisitions, 12 frames shown]
[im 5/117]
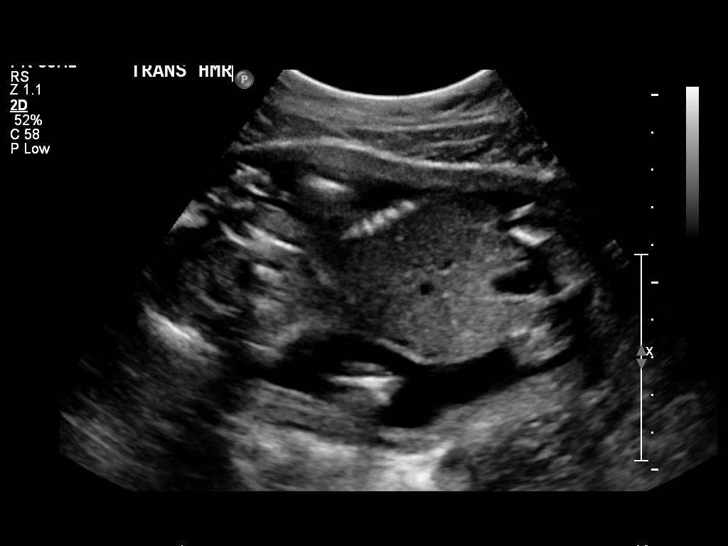
[im 13/117]
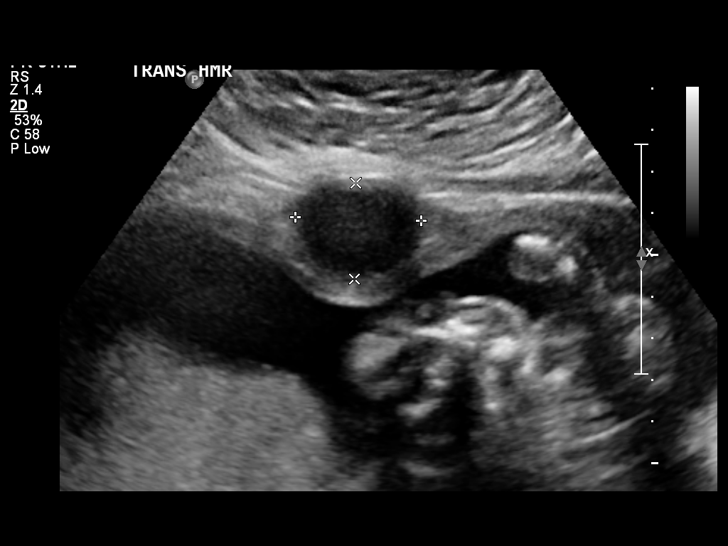
[im 22/117]
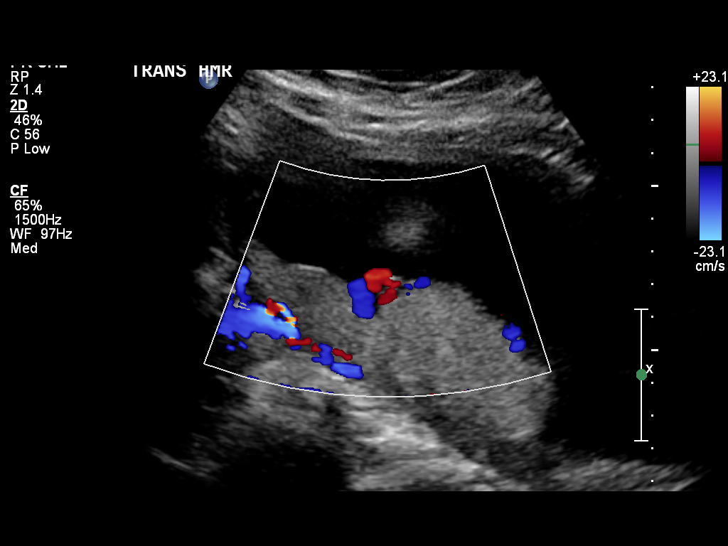
[im 35/117]
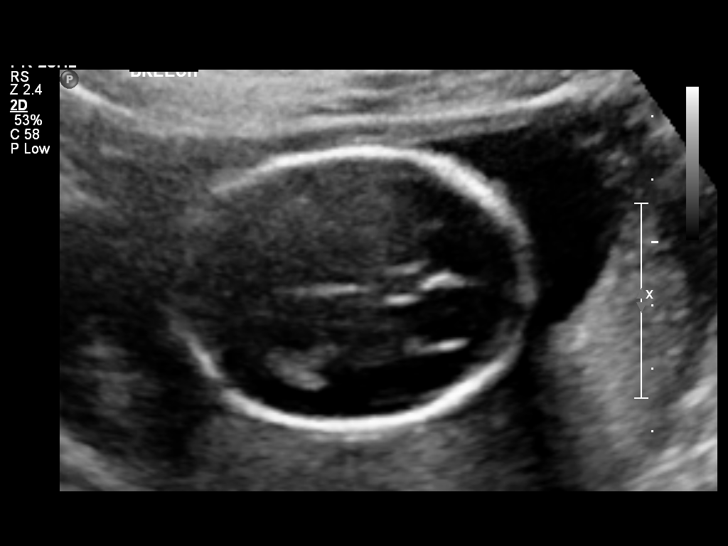
[im 43/117]
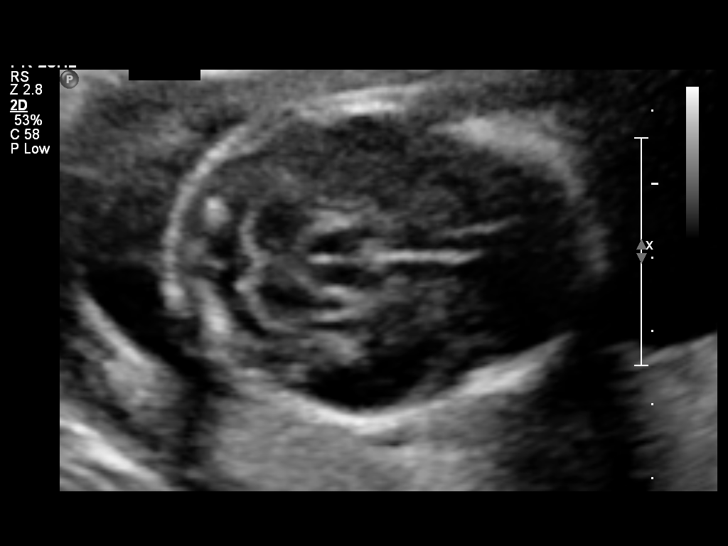
[im 52/117]
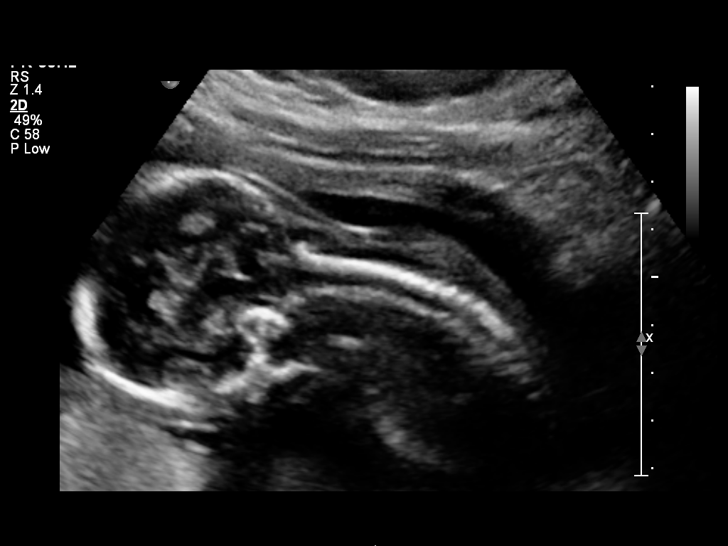
[im 65/117]
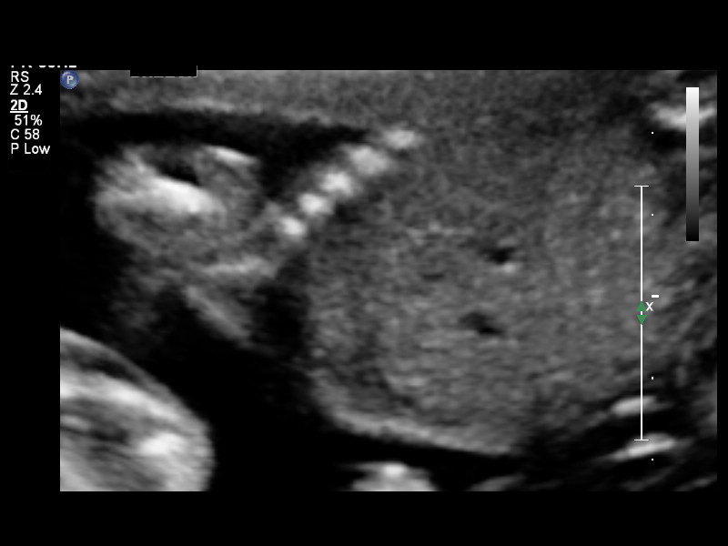
[im 74/117]
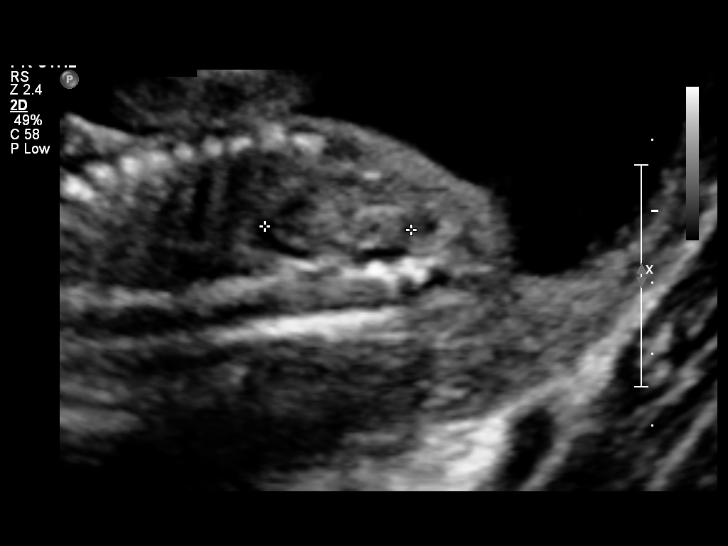
[im 82/117]
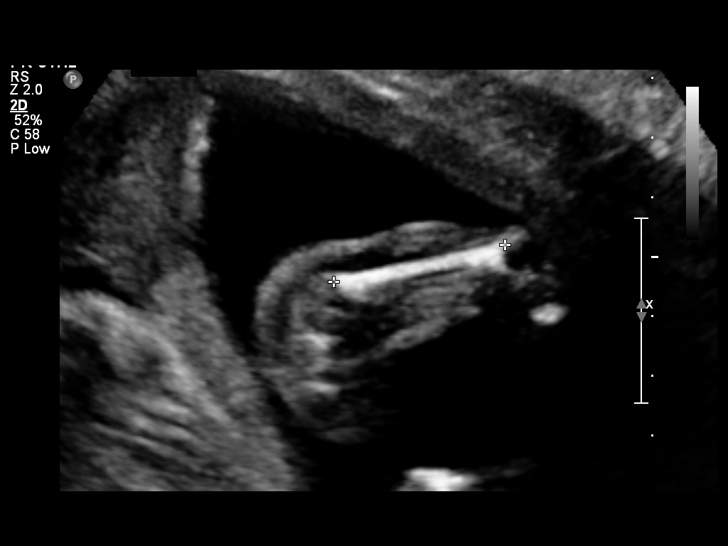
[im 95/117]
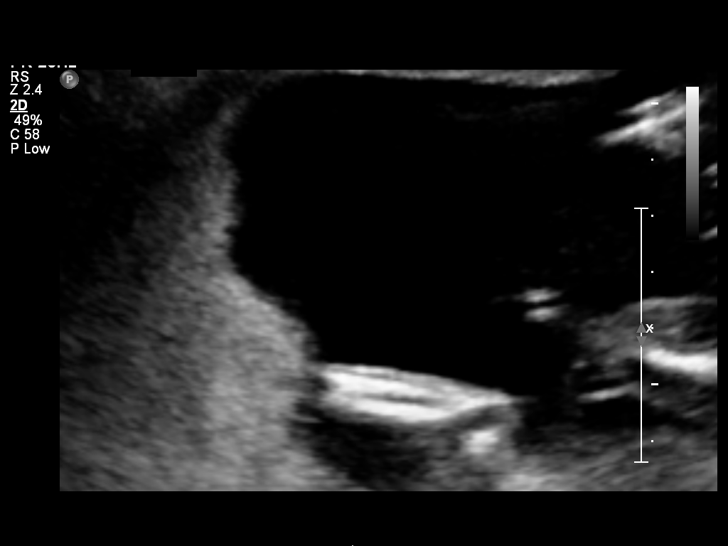
[im 104/117]
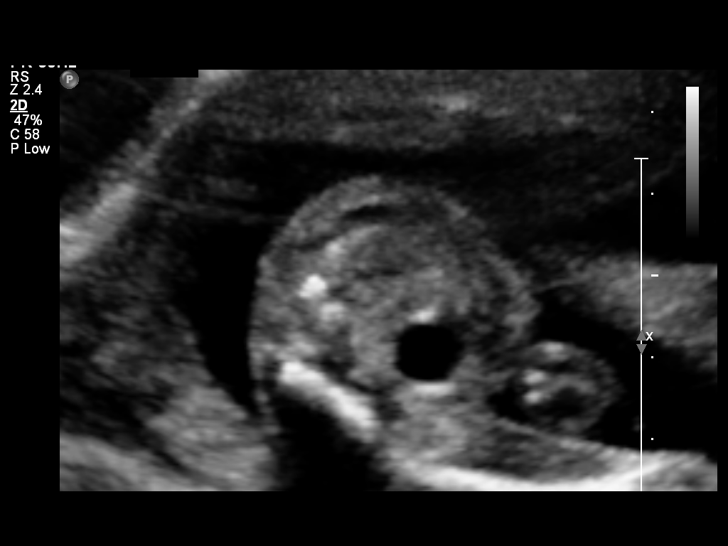
[im 112/117]
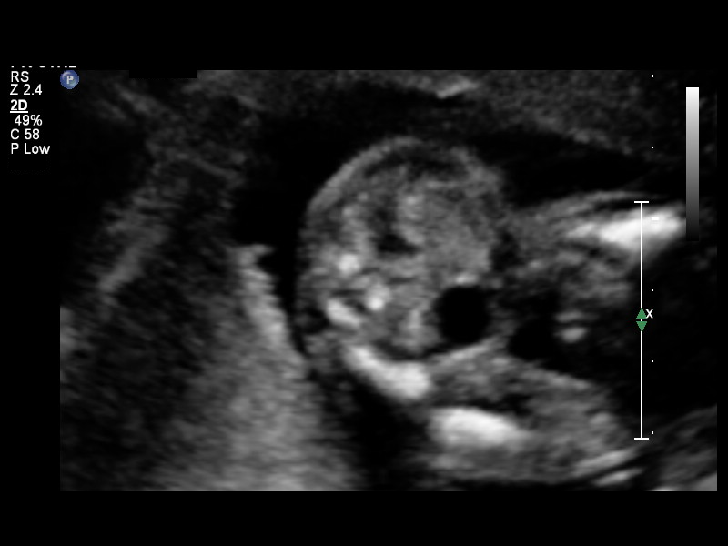

[12 of 28 positions shown; findings below may reference images not displayed]

OBSTETRICS REPORT
                      (Signed Final 10/07/2013 [DATE])

Service(s) Provided

 US OB COMP + 14 WK                                    76805.1
Indications

 Basic anatomic survey
Fetal Evaluation

 Num Of Fetuses:    1
 Fetal Heart Rate:  144                          bpm
 Cardiac Activity:  Observed
 Presentation:      Breech
 Placenta:          Posterior, above cervical
                    os
 P. Cord            Visualized, central
 Insertion:

 Amniotic Fluid
 AFI FV:      Subjectively within normal limits
                                             Larg Pckt:     6.8  cm
Biometry

 BPD:     43.8  mm     G. Age:  19w 2d                CI:        71.83   70 - 86
                                                      FL/HC:      18.3   16.1 -

 HC:     164.5  mm     G. Age:  19w 1d       51  %    HC/AC:      1.15   1.09 -

 AC:     142.9  mm     G. Age:  19w 4d       67  %    FL/BPD:
 FL:      30.1  mm     G. Age:  19w 2d       54  %    FL/AC:      21.1   20 - 24
 HUM:       29  mm     G. Age:  19w 3d       61  %
 CER:     18.4  mm     G. Age:  18w 1d       26  %
 NFT:     4.63  mm

 Est. FW:     293  gm    0 lb 10 oz      52  %
Gestational Age

 LMP:           19w 0d        Date:  05/27/13                 EDD:   03/03/14
 U/S Today:     19w 2d                                        EDD:   03/01/14
 Best:          19w 0d     Det. By:  LMP  (05/27/13)          EDD:   03/03/14
Anatomy
 Cranium:          Appears normal         Aortic Arch:      Appears normal
 Fetal Cavum:      Appears normal         Ductal Arch:      Appears normal
 Ventricles:       Appears normal         Diaphragm:        Appears normal
 Choroid Plexus:   Appears normal         Stomach:          Appears normal, left
                                                            sided
 Cerebellum:       Appears normal         Abdomen:          Appears normal
 Posterior Fossa:  Appears normal         Abdominal Wall:   Appears nml (cord
                                                            insert, abd wall)
 Nuchal Fold:      Appears normal         Cord Vessels:     Appears normal (3
                                                            vessel cord)
 Face:             Appears normal         Kidneys:          Appear normal
                   (orbits and profile)
 Lips:             Not well visualized    Bladder:          Appears normal
 Heart:            Appears normal         Spine:            Ltd views no
                   (4CH, axis, and                          intracranial signs of
                   situs)                                   NTD
 RVOT:             Appears normal         Lower             Appears normal
                                          Extremities:
 LVOT:             Appears normal         Upper             Appears normal
                                          Extremities:

 Other:  Fetus appears to be a female. Nasal bone visualized. Technically
         difficult due to maternal habitus and fetal position.
Targeted Anatomy

 Fetal Central Nervous System
 Cisterna Magna:
Cervix Uterus Adnexa

 Cervical Length:    3.7      cm

 Cervix:       Normal appearance by transabdominal scan.
 Uterus:       Multiple fibroids noted, see table below.

 Left Ovary:    Within normal limits.
 Right Ovary:   Within normal limits.
 Adnexa:     No adnexal mass visualized.
Myomas

 Site                     L(cm)      W(cm)       D(cm)      Location
 Anterior                 3
 Posterior

 Blood Flow                  RI       PI       Comments

Impression

 SIUP at 19+0 weeks
 Normal detailed fetal anatomy; limited views of upper lip
 Markers of aneuploidy: none
 Normal amniotic fluid volume
 Measurements consistent with LMP dating
 Fibroid uterus: see above for size and location
Recommendations

 Follow-up as clinically indicated

 questions or concerns.

## 2016-04-14 ENCOUNTER — Emergency Department (INDEPENDENT_AMBULATORY_CARE_PROVIDER_SITE_OTHER)
Admission: EM | Admit: 2016-04-14 | Discharge: 2016-04-14 | Disposition: A | Discharge status: Home / Self Care | Payer: 59 | Source: Home / Self Care | Attending: Family Medicine | Admitting: Family Medicine

## 2016-04-14 ENCOUNTER — Encounter: Payer: Self-pay | Admitting: Emergency Medicine

## 2016-04-14 DIAGNOSIS — J111 Influenza due to unidentified influenza virus with other respiratory manifestations: Secondary | ICD-10-CM

## 2016-04-14 DIAGNOSIS — R69 Illness, unspecified: Secondary | ICD-10-CM | POA: Diagnosis not present

## 2016-04-14 LAB — POCT RAPID STREP A (OFFICE): Rapid Strep A Screen: NEGATIVE

## 2016-04-14 MED ORDER — OSELTAMIVIR PHOSPHATE 75 MG PO CAPS: 75.0000 mg | ORAL_CAPSULE | Freq: Two times a day (BID) | ORAL | 0 refills | Status: DC

## 2016-04-14 MED ORDER — ACETAMINOPHEN 325 MG PO TABS
650.0000 mg | ORAL_TABLET | Freq: Four times a day (QID) | ORAL | Status: DC | PRN
  Administered 2016-04-14: 650 mg via ORAL

## 2016-04-14 MED ORDER — IBUPROFEN 600 MG PO TABS
600.0000 mg | ORAL_TABLET | Freq: Once | ORAL | Status: AC
  Administered 2016-04-14: 600 mg via ORAL

## 2016-04-14 NOTE — ED Triage Notes (Signed)
Pt c/o fever, sore throat, body aches and  Nausea that started suddenly last night. She has not taken any meds for this. Unsure temp at home.

## 2016-04-14 NOTE — Discharge Instructions (Signed)
°  You may take 400-600mg  Ibuprofen (Motrin) every 6-8 hours for fever and pain  Alternate with Tylenol  You may take 500mg  Tylenol every 4-6 hours as needed for fever and pain  Follow-up with your primary care provider next week for recheck of symptoms if not improving.  Be sure to drink plenty of fluids and rest, at least 8hrs of sleep a night, preferably more while you are sick. Return urgent care or go to closest ER if you cannot keep down fluids/signs of dehydration, fever not reducing with Tylenol, difficulty breathing/wheezing, stiff neck, worsening condition, or other concerns (see below)   Oseltamivir (Tamiflu) is an antiviral medication that can help decrease symptoms of the flu by about 1 days and lessen severity of symptoms.  It is best to start this medication within first 48 hours of symptoms, however, some studies have shown relief even after the 48 hour time frame.  This medication may cause stomach upset including nausea, vomiting and diarrhea.  It may also cause dizziness or hallucinations in children.  To help prevent stomach upset, you may take this medication with food.  If you are still having unwanted symptoms, you may stop taking this medication as it is not as important to finish the entire course like antibiotics.  If you have questions/concerns please call our office or follow up with your primary care provider.

## 2016-04-14 NOTE — ED Provider Notes (Signed)
CSN: [id number]     Arrival date & time 04/14/16  1719 History   First MD Initiated Contact with Patient 04/14/16 1817     Chief Complaint  Patient presents with  . Fever   (Consider location/radiation/quality/duration/timing/severity/associated sxs/prior Treatment) HPI Tasha Barrett is a 26 y.o. female presenting to UC with c/o sudden onset body aches, fever, chills, and nausea that started last night.  She has not taken any medication for this as she notes she did smoke marijuana and was not sure if she could take any medication today.  Pt does not believe the marijuana caused her symptoms as she has smoked it in the past.  Denies chest pain or SOB. Denies vomiting or diarrhea. Denies known sick contacts. She did not get the flu vaccine this season. Denies hx of asthma.  Pt is accompanied by a friend who has been well.     Past Medical History:  Diagnosis Date  . Anxiety    Used Latuda (did not work)  . Bipolar 2 disorder ([place])   . Bipolar disorder ([place])   . Depression   . Pneumonia    age 25   . Yeast infection    Past Surgical History:  Procedure Laterality Date  . BREAST REDUCTION SURGERY  01/2015  . CESAREAN SECTION N/A 03/09/2014   Procedure: CESAREAN SECTION;  Surgeon: Donnamae Jude, MD;  Location: [facility];  Service: Obstetrics;  Laterality: N/A;   Family History  Problem Relation Age of Onset  . Depression Mother   . Gout Father   . Hypertension Father   . Gout Paternal Uncle   . Gout Paternal Grandfather   . Anesthesia problems Neg Hx   . Hypotension Neg Hx   . Malignant hyperthermia Neg Hx   . Pseudochol deficiency Neg Hx    Social History  Substance Use Topics  . Smoking status: Never Smoker  . Smokeless tobacco: Never Used  . Alcohol use 0.6 oz/week    1 Glasses of wine per week     Comment: occ   OB History    Gravida Para Term Preterm AB Living   1 1 1     1    SAB TAB Ectopic Multiple Live Births         0 1     Review of Systems   Constitutional: Positive for chills, fatigue and fever.  HENT: Positive for congestion and sore throat. Negative for ear pain, trouble swallowing and voice change.   Respiratory: Positive for cough ( minimal). Negative for shortness of breath.   Cardiovascular: Negative for chest pain and palpitations.  Gastrointestinal: Positive for nausea. Negative for abdominal pain, diarrhea and vomiting.  Musculoskeletal: Positive for arthralgias, back pain and myalgias.  Skin: Negative for rash.  Neurological: Positive for headaches. Negative for dizziness and light-headedness.    Allergies  Amoxicillin-pot clavulanate and Latex  Home Medications   Prior to Admission medications   Medication Sig Start Date End Date Taking? Authorizing Provider  ARIPiprazole (ABILIFY) 10 MG tablet Take 1 tablet (10 mg total) by mouth daily. Patient not taking: Reported on 01/31/2016 10/28/15   Emeterio Reeve, DO  lamoTRIgine (LAMICTAL) 150 MG tablet Take 1 tablet (150 mg total) by mouth daily. For mood stabilization Patient not taking: Reported on 01/31/2016 09/07/15   Merian Capron, MD  oseltamivir (TAMIFLU) 75 MG capsule Take 1 capsule (75 mg total) by mouth every 12 (twelve) hours. 04/14/16   Noland Fordyce, PA-C   Meds Ordered and Administered this  Visit   Medications  ibuprofen (ADVIL,MOTRIN) tablet 600 mg (600 mg Oral Given 04/14/16 1827)    BP 128/77 (BP Location: Right Arm)   Pulse 114   Temp 102 F (38.9 C) (Oral)   Wt 229 lb (103.9 kg)   SpO2 96%   BMI 41.88 kg/m  No data found.   Physical Exam  Constitutional: She is oriented to person, place, and time. She appears well-developed and well-nourished. No distress.  Pt sleeping on exam bed, easily awakened. Appears acutely ill but non-toxic. Alert and cooperative during exam.  HENT:  Head: Normocephalic and atraumatic.  Right Ear: Tympanic membrane normal.  Left Ear: Tympanic membrane normal.  Nose: Mucosal edema present.  Mouth/Throat:  Uvula is midline and mucous membranes are normal. Posterior oropharyngeal erythema present. No oropharyngeal exudate, posterior oropharyngeal edema or tonsillar abscesses.  Eyes: EOM are normal.  Neck: Normal range of motion. Neck supple.  Cardiovascular: Regular rhythm.  Tachycardia present.   Pulmonary/Chest: Effort normal and breath sounds normal. No stridor. No respiratory distress. She has no wheezes. She has no rales.  Musculoskeletal: Normal range of motion.  Lymphadenopathy:    She has no cervical adenopathy.  Neurological: She is alert and oriented to person, place, and time.  Skin: Skin is warm and dry. She is not diaphoretic.  Psychiatric: She has a normal mood and affect. Her behavior is normal.  Nursing note and vitals reviewed.   Urgent Care Course     Procedures (including critical care time)  Labs Review Labs Reviewed  POCT RAPID STREP A (OFFICE)    Imaging Review No results found.    MDM   1. Influenza-like illness    Hx and exam c/w influenza w/o evidence of underlying bacterial infection at this time.  Temp: 103.1*F in UC with HR of 130 in triage. Tachycardia likely due to fever. No respiratory distress.  Acetaminophen and ibuprofen given in UC. Temp improved to 102*F and HR improved to 114   Discussed risks/benefits of Tamiflu. Pt would like to try the treatment. Rx: Tamiflu   Encouraged fluids, rest, acetaminophen and ibuprofen.  Encouraged f/u with PCP in 1 week if not improving, sooner if worsening.      Noland Fordyce, PA-C 04/15/16 1120

## 2016-05-12 IMAGING — US US MFM FETAL NUCHAL TRANSLUCENCY
1 series · 13 of 28 positions shown · non-contrast
Comparison: none

[Series 1: us mfm fetal nuchal translucency · 13 of 34 slices shown]
[im 2/34]
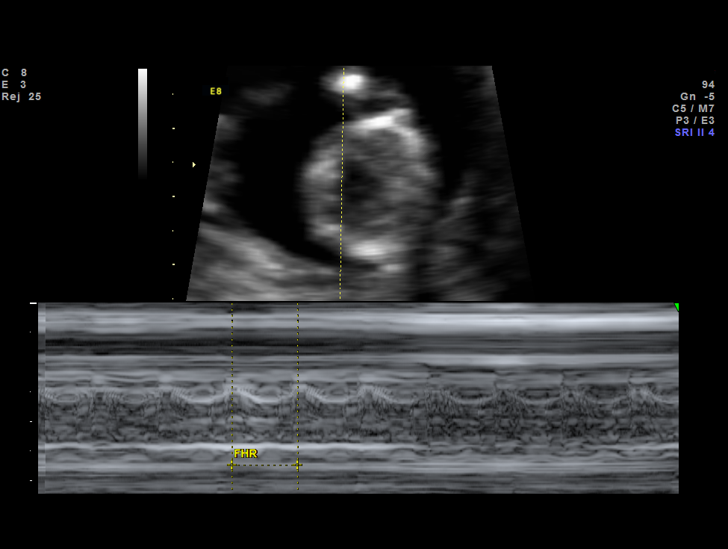
[im 4/34]
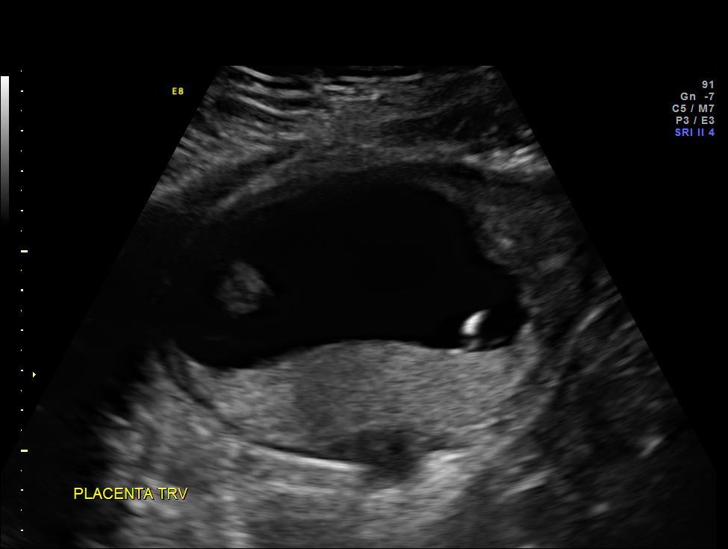
[im 7/34]
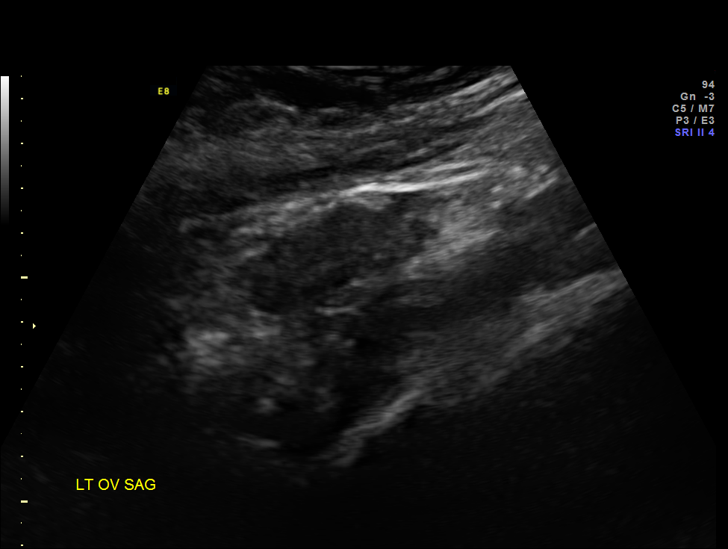
[im 9/34]
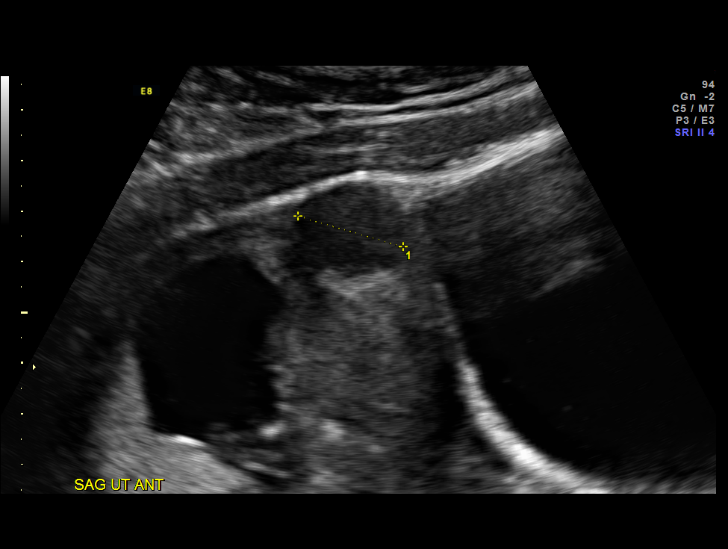
[im 12/34]
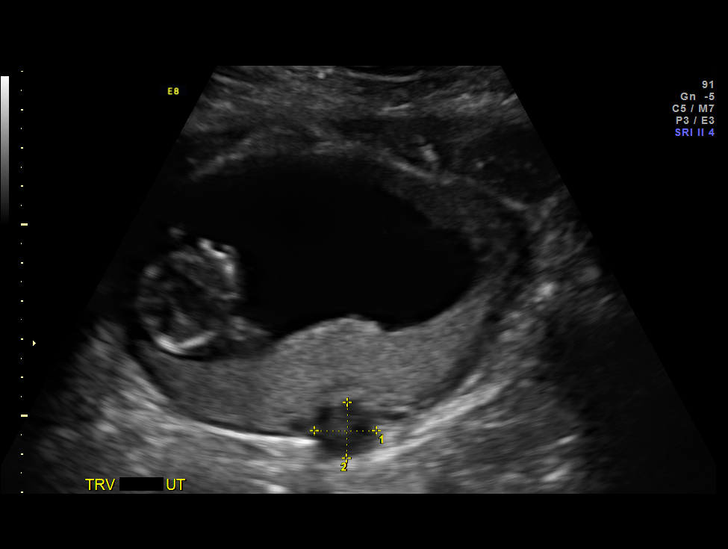
[im 14/34]
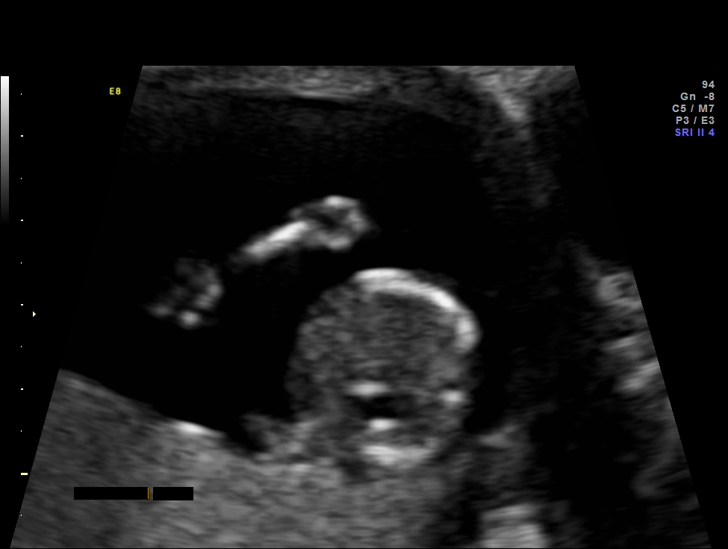
[im 18/34]
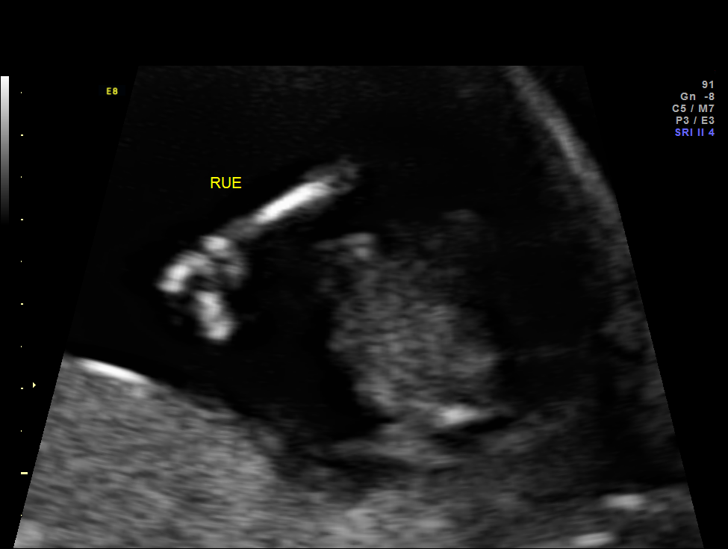
[im 20/34]
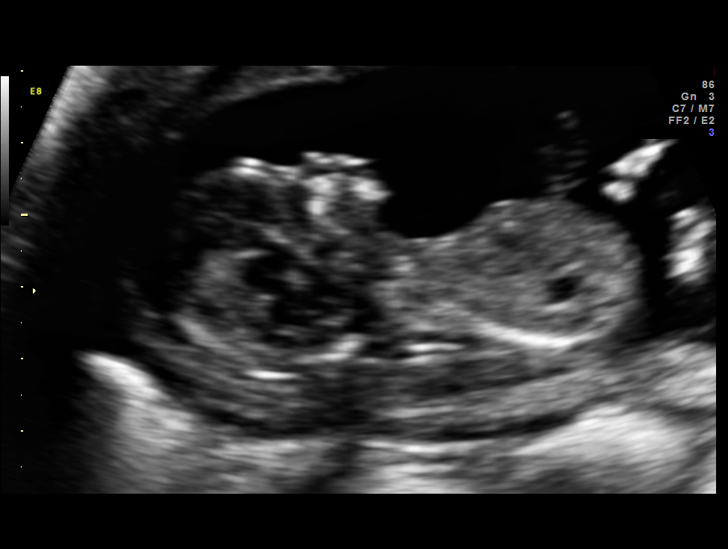
[im 23/34]
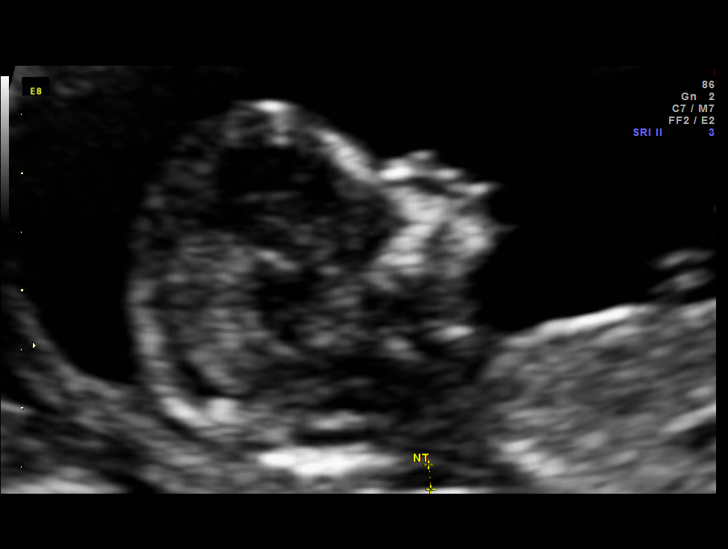
[im 25/34]
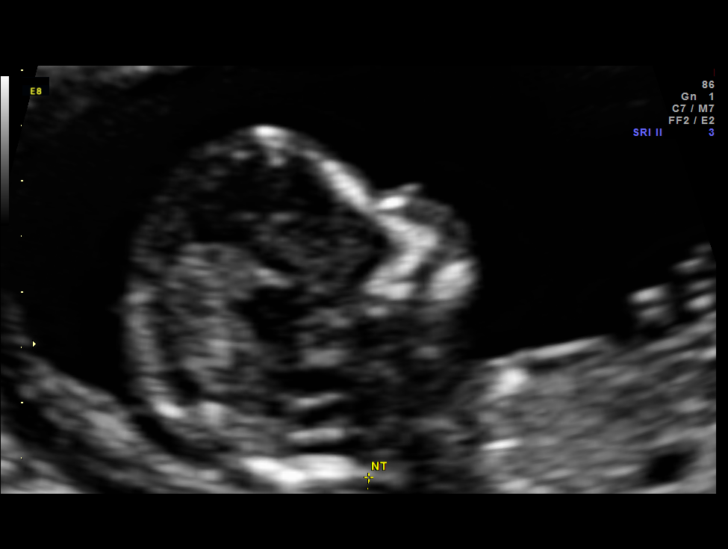
[im 27/34]
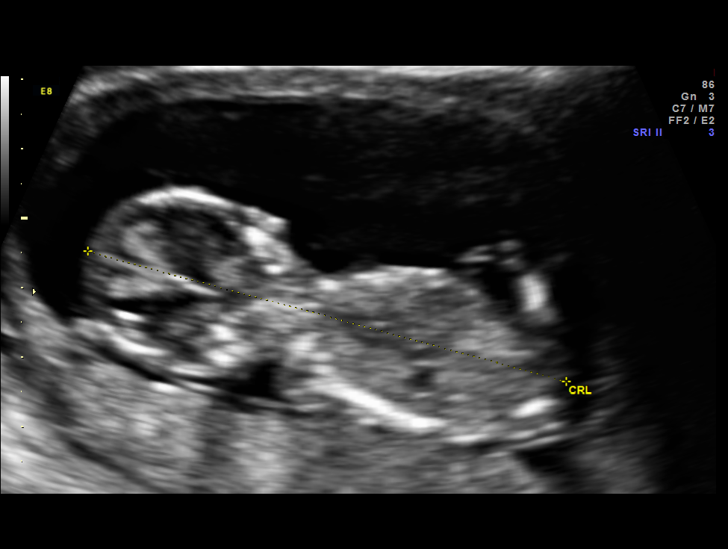
[im 30/34]
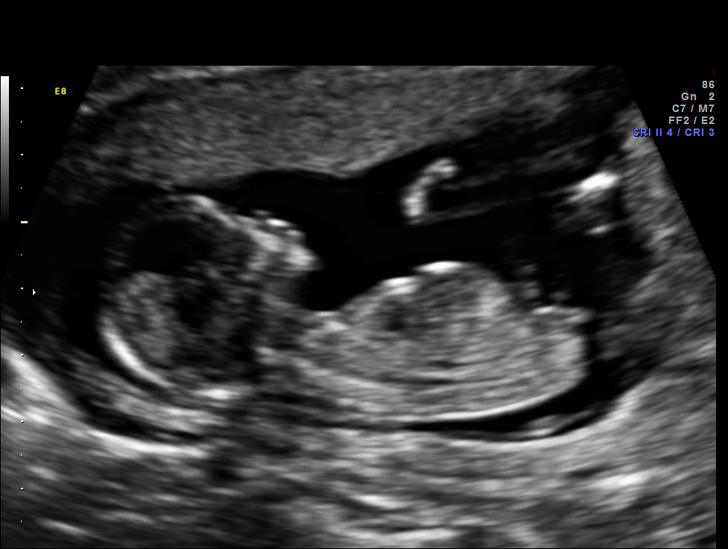
[im 32/34]
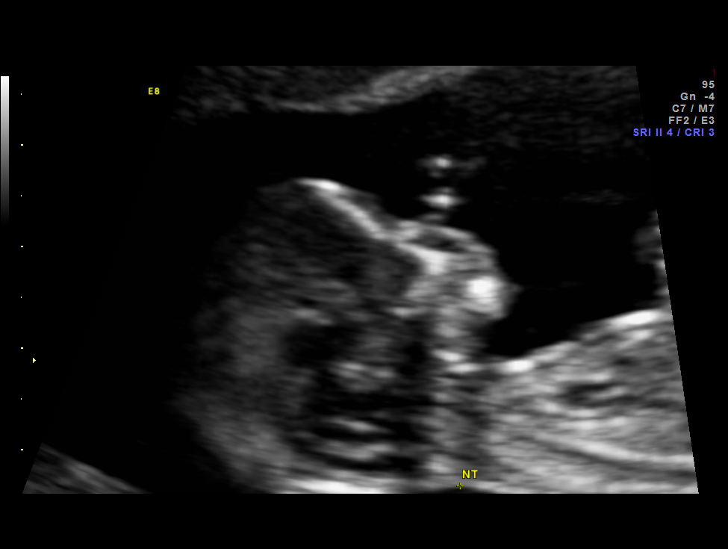

[13 of 28 positions shown; findings below may reference images not displayed]

OBSTETRICS REPORT
                      (Signed Final 08/26/2013 [DATE])

Service(s) Provided

Indications

 First trimester aneuploidy screen (NT)
Fetal Evaluation

 Num Of Fetuses:    1
 Fetal Heart Rate:  153                          bpm
 Cardiac Activity:  Observed
 Presentation:      Transverse, head to
                    maternal right
 Placenta:          Posterior

 Amniotic Fluid
 AFI FV:      Subjectively within normal limits
Gestational Age

 LMP:           13w 0d        Date:  05/27/13                 EDD:   03/03/14
 Best:          13w 0d     Det. By:  LMP  (05/27/13)          EDD:   03/03/14
1st Trimester Genetic Sonogram Screening

 CRL:            71.5  mm    G. Age:   13w 1d                 EDD:   03/02/14
 Nuc Trans:      1.73  mm

 Nasal Bone:                 Present
Anatomy

 Cranium:          Appears normal         Cord Vessels:     Appears normal (3
                                                            vessel cord)
 Choroid Plexus:   Appears normal         Lower             Visualized
                                          Extremities:
 Stomach:          Appears normal, left   Upper             Visualized
                   sided                  Extremities:
Cervix Uterus Adnexa

 Cervix:       Normal appearance by transabdominal scan.
 Uterus:       Multiple fibroids noted, see table below.
 Left Ovary:    Within normal limits.
 Right Ovary:   Within normal limits. Small corpus luteum noted.
 Adnexa:     No abnormality visualized.
Myomas

 Site                     L(cm)      W(cm)       D(cm)      Location
 Anterior                 2.2        2.7         2
 Posterior

 Blood Flow                  RI       PI       Comments

Impression

 SIUP at 88w4d
 NT = 1.7mm
 Nasal bone present
 Fetal morphology is gestational age appropriate
 Incidental note is made of uterine myomas as measured
 above
Recommendations

 1. analytes drawn; formal risk to be provided by the lab
 2. anatomy scan in 6 weeks.

 questions or concerns.

## 2016-08-06 ENCOUNTER — Encounter: Payer: Self-pay | Admitting: Osteopathic Medicine

## 2016-08-06 ENCOUNTER — Ambulatory Visit (INDEPENDENT_AMBULATORY_CARE_PROVIDER_SITE_OTHER): Payer: 59 | Admitting: Osteopathic Medicine

## 2016-08-06 VITALS — BP 133/82 | HR 112 | Temp 102.3°F | Wt 216.0 lb

## 2016-08-06 DIAGNOSIS — J020 Streptococcal pharyngitis: Secondary | ICD-10-CM

## 2016-08-06 LAB — POCT RAPID STREP A (OFFICE): RAPID STREP A SCREEN: NEGATIVE

## 2016-08-06 MED ORDER — AZITHROMYCIN 250 MG PO TABS: ORAL_TABLET | ORAL | 0 refills | Status: DC

## 2016-08-06 MED ORDER — LIDOCAINE VISCOUS HCL 2 % MT SOLN: 10.0000 mL | OROMUCOSAL | 1 refills | Status: DC | PRN

## 2016-08-06 MED ORDER — CEPHALEXIN 500 MG PO TABS: 500.0000 mg | ORAL_TABLET | Freq: Two times a day (BID) | ORAL | 0 refills | Status: DC

## 2016-08-06 NOTE — Patient Instructions (Signed)
Strep Throat Strep throat is a bacterial infection of the throat. Your health care provider may call the infection tonsillitis or pharyngitis, depending on whether there is swelling in the tonsils or at the back of the throat. Strep throat is most common during the cold months of the year in children who are 5-26 years of age, but it can happen during any season in people of any age. This infection is spread from person to person (contagious) through coughing, sneezing, or close contact. What are the causes? Strep throat is caused by the bacteria called Streptococcus pyogenes. What increases the risk? This condition is more likely to develop in:  People who spend time in crowded places where the infection can spread easily.  People who have close contact with someone who has strep throat.  What are the signs or symptoms? Symptoms of this condition include:  Fever or chills.  Redness, swelling, or pain in the tonsils or throat.  Pain or difficulty when swallowing.  White or yellow spots on the tonsils or throat.  Swollen, tender glands in the neck or under the jaw.  Red rash all over the body (rare).  How is this diagnosed? This condition is diagnosed by performing a rapid strep test or by taking a swab of your throat (throat culture test). Results from a rapid strep test are usually ready in a few minutes, but throat culture test results are available after one or two days. How is this treated? This condition is treated with antibiotic medicine. Follow these instructions at home: Medicines  Take over-the-counter and prescription medicines only as told by your health care provider.  Take your antibiotic as told by your health care provider. Do not stop taking the antibiotic even if you start to feel better.  Have family members who also have a sore throat or fever tested for strep throat. They may need antibiotics if they have the strep infection. Eating and drinking  Do not  share food, drinking cups, or personal items that could cause the infection to spread to other people.  If swallowing is difficult, try eating soft foods until your sore throat feels better.  Drink enough fluid to keep your urine clear or pale yellow. General instructions  Gargle with a salt-water mixture 3-4 times per day or as needed. To make a salt-water mixture, completely dissolve -1 tsp of salt in 1 cup of warm water.  Make sure that all household members wash their hands well.  Get plenty of rest.  Stay home from school or work until you have been taking antibiotics for 24 hours.  Keep all follow-up visits as told by your health care provider. This is important. Contact a health care provider if:  The glands in your neck continue to get bigger.  You develop a rash, cough, or earache.  You cough up a thick liquid that is green, yellow-brown, or bloody.  You have pain or discomfort that does not get better with medicine.  Your problems seem to be getting worse rather than better.  You have a fever. Get help right away if:  You have new symptoms, such as vomiting, severe headache, stiff or painful neck, chest pain, or shortness of breath.  You have severe throat pain, drooling, or changes in your voice.  You have swelling of the neck, or the skin on the neck becomes red and tender.  You have signs of dehydration, such as fatigue, dry mouth, and decreased urination.  You become increasingly sleepy, or   you cannot wake up completely.  Your joints become red or painful. This information is not intended to replace advice given to you by your health care provider. Make sure you discuss any questions you have with your health care provider. Document Released: 01/20/2000 Document Revised: 09/21/2015 Document Reviewed: 05/17/2014 Elsevier Interactive Patient Education  2017 [employer] Inc[employer]  
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
## 2016-08-08 ENCOUNTER — Emergency Department
Admission: EM | Admit: 2016-08-08 | Discharge: 2016-08-08 | Disposition: A | Discharge status: Home / Self Care | Payer: Self-pay | Source: Home / Self Care | Attending: Family Medicine | Admitting: Family Medicine

## 2016-08-08 DIAGNOSIS — J029 Acute pharyngitis, unspecified: Secondary | ICD-10-CM

## 2016-08-08 DIAGNOSIS — R5383 Other fatigue: Secondary | ICD-10-CM

## 2016-08-08 LAB — POCT CBC W AUTO DIFF (K'VILLE URGENT CARE)

## 2016-08-08 LAB — POCT MONO SCREEN (KUC): Mono, POC: NEGATIVE

## 2016-08-08 MED ORDER — DEXAMETHASONE SODIUM PHOSPHATE 10 MG/ML IJ SOLN
10.0000 mg | Freq: Once | INTRAMUSCULAR | Status: AC
  Administered 2016-08-08: 10 mg via INTRAMUSCULAR

## 2016-08-08 MED ORDER — BENZOCAINE-MENTHOL 15-4 MG MT LOZG
1.0000 | LOZENGE | Freq: Three times a day (TID) | OROMUCOSAL | 0 refills | Status: DC | PRN
Start: 2016-08-08 — End: 2016-08-08

## 2016-08-08 MED ORDER — PREDNISONE 20 MG PO TABS: ORAL_TABLET | ORAL | 0 refills | Status: DC

## 2016-08-08 MED ORDER — BENZOCAINE-MENTHOL 15-4 MG MT LOZG: 1.0000 | LOZENGE | Freq: Three times a day (TID) | OROMUCOSAL | 0 refills | Status: DC | PRN

## 2016-08-08 MED ORDER — KETOROLAC TROMETHAMINE 60 MG/2ML IM SOLN
60.0000 mg | Freq: Once | INTRAMUSCULAR | Status: AC
  Administered 2016-08-08: 60 mg via INTRAMUSCULAR

## 2016-08-08 NOTE — Discharge Instructions (Signed)
°  Please continue to take you cephalexin (antibiotic) as prescribed.  Be sure to complete the entire course of the medication even if feeling better to help prevent the infection from coming back.   You were given your first dose of steroids today- Decadron, you should start the oral prednisone tomorrow with breakfast. This medication can help decrease inflammation throughout your body including your tonsils and airway to help with pain and breathing.   Do not take medication prescribed to other people and do not share your prescriptions with other people.  This is for your safety and theirs.   Your rapid mono test was negative, a more detailed test was sent off and can take several days to come back.  If you do have mono, the sore throat should start subsiding within the next 10 days with the help of prescribed Prednisone, alternating acetaminophen (Tylenol) and ibuprofen (Motrin or Advil) as well as salt water gargles and plenty of rest- at least 8 hours at night.  Avoid sharing foods, drinks, utensils, and kissing until you are feeling better and no fever for at least 24 hours to help prevent the spread of illness.   If you develop trouble breathing, unable to swallow liquids, passing out, or other severe symptoms, please call 911 or go to closest emergency department for further evaluation.

## 2016-08-08 NOTE — ED Triage Notes (Signed)
Pt was seen by Dr. Sheppard Coil on Monday and treated with antibiotics.  Pt still has a sore throat, fever, headache, and fatigue.

## 2016-08-08 NOTE — ED Provider Notes (Signed)
CSN: [id number]     Arrival date & time 08/08/16  1259 History   First MD Initiated Contact with Patient 08/08/16 1323     Chief Complaint  Patient presents with  . Sore Throat  . Headache  . Fatigue  . Fever   (Consider location/radiation/quality/duration/timing/severity/associated sxs/prior Treatment) HPI  Tasha Barrett is a 26 y.o. female presenting to UC with c/o continued sore throat and fatigue after being seen 2 days ago for same.  She was exposed to strep throat so despite negative Rapid strep in office, pt was started on empiric cephalexin.  She has been taking as prescribed.  She did take ibuprofen last night but has not had anything this morning. Fever was initially 101*F this morning but has improved slightly on its own to 100*F.  She is concerned her throat is still sore and she is feeling fatigued. Associated headache.  Minimal congestion and cough.  Denies n/v/d.  No known hx of Mono. Denies abdominal pain.  She has been able to eat strawberries and drink at least 32oz of water today so far.     Past Medical History:  Diagnosis Date  . Anxiety    Used Latuda (did not work)  . Bipolar 2 disorder ([place])   . Bipolar disorder ([place])   . Depression   . Pneumonia    age 65   . Yeast infection    Past Surgical History:  Procedure Laterality Date  . BREAST REDUCTION SURGERY  01/2015  . CESAREAN SECTION N/A 03/09/2014   Procedure: CESAREAN SECTION;  Surgeon: Donnamae Jude, MD;  Location: [facility];  Service: Obstetrics;  Laterality: N/A;   Family History  Problem Relation Age of Onset  . Depression Mother   . Gout Father   . Hypertension Father   . Gout Paternal Uncle   . Gout Paternal Grandfather   . Anesthesia problems Neg Hx   . Hypotension Neg Hx   . Malignant hyperthermia Neg Hx   . Pseudochol deficiency Neg Hx    Social History  Substance Use Topics  . Smoking status: Never Smoker  . Smokeless tobacco: Never Used  . Alcohol use 0.6 oz/week    1 Glasses of wine  per week     Comment: occ   OB History    Gravida Para Term Preterm AB Living   1 1 1     1    SAB TAB Ectopic Multiple Live Births         0 1     Review of Systems  Constitutional: Positive for fatigue and fever. Negative for chills.  HENT: Positive for congestion and sore throat. Negative for ear pain, trouble swallowing and voice change.   Respiratory: Positive for cough (minimal). Negative for shortness of breath.   Cardiovascular: Negative for chest pain and palpitations.  Gastrointestinal: Negative for abdominal pain, diarrhea, nausea and vomiting.  Musculoskeletal: Negative for arthralgias, back pain and myalgias.  Skin: Negative for rash.  Neurological: Positive for headaches. Negative for dizziness and light-headedness.    Allergies  Amoxicillin-pot clavulanate and Latex  Home Medications   Prior to Admission medications   Medication Sig Start Date End Date Taking? Authorizing Provider  Benzocaine-Menthol 15-4 MG LOZG Use as directed 1 lozenge in the mouth or throat 3 (three) times daily as needed. 08/08/16   Noe Gens, PA-C  Cephalexin 500 MG tablet Take 1 tablet (500 mg total) by mouth 2 (two) times daily. 08/06/16   Emeterio Reeve, DO  Lidocaine  HCl 2 % SOLN Use as directed 10-15 mLs in the mouth or throat every 3 (three) hours as needed (throat pain). 08/06/16   Emeterio Reeve, DO  predniSONE (DELTASONE) 20 MG tablet 3 tabs po day one, then 2 po daily x 4 days 08/08/16   Noe Gens, PA-C   Meds Ordered and Administered this Visit   Medications  ketorolac (TORADOL) injection 60 mg (60 mg Intramuscular Given 08/08/16 1404)  dexamethasone (DECADRON) injection 10 mg (10 mg Intramuscular Given 08/08/16 1404)    BP 140/78 (BP Location: Left Arm)   Pulse (!) 106   Temp 100 F (37.8 C) (Oral)   Ht 5\' 2"  (1.575 m)   Wt 215 lb (97.5 kg)   LMP 08/08/2016   SpO2 98%   BMI 39.32 kg/m  No data found.   Physical Exam  Constitutional: She is oriented to person,  place, and time. She appears well-developed and well-nourished. No distress.  HENT:  Head: Normocephalic and atraumatic.  Right Ear: Tympanic membrane normal.  Left Ear: Tympanic membrane normal.  Nose: Nose normal.  Mouth/Throat: Uvula is midline and mucous membranes are normal. Oropharyngeal exudate, posterior oropharyngeal edema and posterior oropharyngeal erythema present. No tonsillar abscesses. Tonsils are 2+ on the right. Tonsils are 2+ on the left.  Eyes: EOM are normal.  Neck: Normal range of motion. Neck supple.  Cardiovascular: Regular rhythm.  Tachycardia present.   mild  Pulmonary/Chest: Effort normal and breath sounds normal. No stridor. No respiratory distress. She has no wheezes. She has no rales.  Musculoskeletal: Normal range of motion.  Lymphadenopathy:    She has cervical adenopathy.  Neurological: She is alert and oriented to person, place, and time.  Skin: Skin is warm and dry. She is not diaphoretic.  Psychiatric: She has a normal mood and affect. Her behavior is normal.  Nursing note and vitals reviewed.   Urgent Care Course     Procedures (including critical care time)  Labs Review Labs Reviewed  EPSTEIN-BARR VIRUS EARLY D ANTIGEN ANTIBODY, IGG  EPSTEIN-BARR VIRUS NUCLEAR ANTIGEN ANTIBODY, IGG  EPSTEIN-BARR VIRUS VCA, IGG  EPSTEIN-BARR VIRUS VCA, IGM  POCT CBC W AUTO DIFF ([facility])  POCT MONO SCREEN ([facility])    Imaging Review No results found.    MDM   1. Pharyngitis, unspecified etiology   2. Fatigue, unspecified type    Pt c/o continued symptoms despite 2 days of antibiotics. No pain medication was taken today.  Vitals: Temp 100*F, mild tachycardia with regular rhythm at 106 bpm- likely due to low grade fever and pain. Improved to 94 bpm while in UC. O2 Sat 98% on RA  Due to reports of severe fatigue, question if pt has mono. Advised pt treatment for mono is symptomatic. Pt would still like to be tested.  Rapid mono-  negative CBC- unremarkable EBV antibody tests sent to lab  Tx in UC- Decadron 10mg  IM and Tordaol 60mg  IM  Rx: benzocaine/menthol throat lozenges and prednisone (to start tomorrow) Encouraged fluids, rest, continue taking antibiotic, acetaminophen and ibuprofen. F/u with PCP in 7-10 days if not improving. Discussed symptoms that warrant emergent care in the ED. Work note provided for tomorrow.     Noe Gens, [place] 08/08/16 1425

## 2016-08-10 LAB — EPSTEIN-BARR VIRUS VCA, IGG: EBV VCA IgG: 277 U/mL — ABNORMAL HIGH

## 2016-08-10 LAB — EPSTEIN-BARR VIRUS EARLY D ANTIGEN ANTIBODY, IGG: EBV EA IgG: <9 U/mL

## 2016-08-10 LAB — EPSTEIN-BARR VIRUS NUCLEAR ANTIGEN ANTIBODY, IGG: EBV NA IgG: 200 U/mL — ABNORMAL HIGH

## 2016-08-10 LAB — EPSTEIN-BARR VIRUS VCA, IGM: EBV VCA IgM: <36 U/mL

## 2016-08-12 ENCOUNTER — Telehealth: Payer: Self-pay | Admitting: Emergency Medicine

## 2016-08-12 NOTE — Telephone Encounter (Signed)
Patient informed of Negative Results. Advised to call back with questions or concerns.  

## 2017-02-06 ENCOUNTER — Telehealth: Payer: Self-pay

## 2017-02-06 MED ORDER — FLUCONAZOLE 150 MG PO TABS: 150.0000 mg | ORAL_TABLET | Freq: Once | ORAL | 1 refills | Status: AC

## 2017-02-06 NOTE — Telephone Encounter (Signed)
Sent, can let patient know

## 2017-02-06 NOTE — Telephone Encounter (Signed)
Pt has been notified of medication request. No other inquiries asked during phone call.

## 2017-02-06 NOTE — Telephone Encounter (Signed)
Tasha Barrett has vaginal itching, vaginal odor and white discharge. She is without insurance or money. She is sleeping on the floor of a cousin's house. She states the OTC medication burns and it take 3 tablets of the Diflucan to get rid of the yeast infection. [facility] in [place].

## 2017-04-11 ENCOUNTER — Encounter: Payer: Self-pay | Admitting: Osteopathic Medicine

## 2017-04-11 ENCOUNTER — Ambulatory Visit (INDEPENDENT_AMBULATORY_CARE_PROVIDER_SITE_OTHER): Payer: Self-pay | Admitting: Osteopathic Medicine

## 2017-04-11 VITALS — BP 128/70 | HR 66 | Temp 98.4°F | Wt 221.1 lb

## 2017-04-11 DIAGNOSIS — B9689 Other specified bacterial agents as the cause of diseases classified elsewhere: Secondary | ICD-10-CM

## 2017-04-11 DIAGNOSIS — N760 Acute vaginitis: Secondary | ICD-10-CM

## 2017-04-11 DIAGNOSIS — B379 Candidiasis, unspecified: Secondary | ICD-10-CM

## 2017-04-11 MED ORDER — FLUCONAZOLE 150 MG PO TABS: 150.0000 mg | ORAL_TABLET | Freq: Once | ORAL | 1 refills | Status: AC

## 2017-04-11 MED ORDER — METRONIDAZOLE 500 MG PO TABS: 500.0000 mg | ORAL_TABLET | Freq: Two times a day (BID) | ORAL | 1 refills | Status: DC

## 2017-04-11 NOTE — Patient Instructions (Signed)
[facility] Dept  Main Building: [address], [place], [place] [address] Phone: [phone], Fax: [phone] Hours: Monday - Friday, 8:30 am - 5:00 pm

## 2017-04-11 NOTE — Progress Notes (Signed)
HPI: Tasha Barrett is a 27 y.o. female who  has a past medical history of Anxiety, Bipolar 2 disorder ([place]), Bipolar disorder ([place]), Depression, Pneumonia, and Yeast infection.  she presents to [facility] today, 04/11/17,  for chief complaint of:  Would like Pap Concern for BV STD check (FYI uninsured)   Doing well, concerned about some vaginal discharge for BV. Due for pap for cervical cancer screening. Has lost insurance in the time she's last seen me.     Past medical history, surgical history, social history and family history reviewed. No updates needed.   Current medication list and allergy/intolerance information reviewed.    Current Outpatient Medications on File Prior to Visit  Medication Sig Dispense Refill  . Benzocaine-Menthol 15-4 MG LOZG Use as directed 1 lozenge in the mouth or throat 3 (three) times daily as needed. 9 lozenge 0  . Cephalexin 500 MG tablet Take 1 tablet (500 mg total) by mouth 2 (two) times daily. 20 tablet 0  . Lidocaine HCl 2 % SOLN Use as directed 10-15 mLs in the mouth or throat every 3 (three) hours as needed (throat pain). 100 mL 1  . predniSONE (DELTASONE) 20 MG tablet 3 tabs po day one, then 2 po daily x 4 days 11 tablet 0  . [DISCONTINUED] traZODone (DESYREL) 50 MG tablet Take 1 tablet (50 mg total) by mouth at bedtime and may repeat dose one time if needed. For sleep 60 tablet 0   No current facility-administered medications on file prior to visit.    Allergies  Allergen Reactions  . Amoxicillin-Pot Clavulanate Other (See Comments)    As child - pt reports NO Hx anaphylaxis, thinks was hives  . Latex Other (See Comments)    Possible yeast infection.      Review of Systems:  Constitutional: No recent illness  HEENT: No  headache  Cardiac: No  chest pain, No  pressure, No palpitations  Respiratory:  No  shortness of breath.  Gastrointestinal: No  abdominal pain  Musculoskeletal: No new  myalgia/arthralgia  Skin: No  Rash  Gu: discharge as noted per HPI   Psychiatric: No  concerns with depression, No  concerns with anxiety  Exam:  BP 128/70   Pulse 66   Temp 98.4 F (36.9 C) (Oral)   Wt 221 lb 1.3 oz (100.3 kg)   LMP 03/16/2017   BMI 40.44 kg/m   Constitutional: VS see above. General Appearance: alert, well-developed, well-nourished, NAD  Eyes: Normal lids and conjunctive, non-icteric sclera  Ears, Nose, Mouth, Throat: MMM, Normal external inspection ears/nares/mouth/lips/gums.  Neck: No masses, trachea midline.   Respiratory: Normal respiratory effort.   Musculoskeletal: Gait normal. Symmetric and independent movement of all extremities  Neurological: Normal balance/coordination. No tremor.  Skin: warm, dry, intact.   Psychiatric: Normal judgment/insight. Normal mood and affect. Oriented x3.     ASSESSMENT/PLAN: The primary encounter diagnosis was Bacterial vaginosis. A diagnosis of Yeast infection was also pertinent to this visit.   Given lack of insurance and relatively low risk (patient wants to get checked for STDs prior to getting married but has been monogamous with time), recommended contacting her local health to inquire about low cost or free STD checks and cervical cancer screening. We'll go ahead and treat at this time for presumed infection with yeast and BV.   Meds ordered this encounter  Medications  . metroNIDAZOLE (FLAGYL) 500 MG tablet    Sig: Take 1 tablet (500 mg total) by  mouth 2 (two) times daily. For BV    Dispense:  14 tablet    Refill:  1  . fluconazole (DIFLUCAN) 150 MG tablet    Sig: Take 1 tablet (150 mg total) by mouth once for 1 dose. Repeat dose in 48-72 hours if symptoms    Dispense:  2 tablet    Refill:  1    Patient Instructions  [facility] Dept  Main Building: [address] [place], [place] [address] Phone: [phone], Fax: [phone] Hours: Monday - Friday, 8:30 am - 5:00  pm    Follow-up plan: Return if symptoms worsen or fail to improve.  Visit summary with medication list and pertinent instructions was printed for patient to review, alert [place] if any changes needed. All questions at time of visit were answered - patient instructed to contact office with any additional concerns. ER/RTC precautions were reviewed with the patient and understanding verbalized.     Please note: voice recognition software was used to produce this document, and typos may escape review. Please contact Dr. Sheppard Coil for any needed clarifications.

## 2017-06-17 ENCOUNTER — Telehealth: Payer: Self-pay | Admitting: Family Medicine

## 2017-06-17 DIAGNOSIS — N390 Urinary tract infection, site not specified: Secondary | ICD-10-CM

## 2017-06-17 MED ORDER — NITROFURANTOIN MONOHYD MACRO 100 MG PO CAPS
100.0000 mg | ORAL_CAPSULE | Freq: Two times a day (BID) | ORAL | 0 refills | Status: AC
Start: 2017-06-17 — End: 2017-06-22

## 2017-06-17 NOTE — Progress Notes (Signed)

## 2017-08-02 ENCOUNTER — Telehealth: Payer: Self-pay | Admitting: Osteopathic Medicine

## 2017-08-02 DIAGNOSIS — J020 Streptococcal pharyngitis: Secondary | ICD-10-CM

## 2017-08-02 MED ORDER — CEPHALEXIN 500 MG PO TABS: 500.0000 mg | ORAL_TABLET | Freq: Two times a day (BID) | ORAL | 0 refills | Status: DC

## 2017-08-02 NOTE — Telephone Encounter (Signed)
Will call in something for UTI other than flagyl (which is usually for BV) - we do risk inadequate treatment if we can't do a urine specimen for culture, so if this round of antibiotics isn't helping she will at least need to come for a urine collection. We should also confirm that she is not pregnant as this can affect antibiotic choices - I'll send something that's fine for pregnancy just in case. Sent to [facility] (pharmacy on file)

## 2017-08-02 NOTE — Telephone Encounter (Signed)
Pt advised. She will call if urinary SX persist after antibiotic. Also states she took pregnancy test last week and it was negative.  She is requesting that the RX go to [facility] in [place] [phone])  This has been verbally called in and I have cancelled [facility].

## 2017-08-02 NOTE — Telephone Encounter (Signed)
Pt states she did an e-visit for a UTI and was treated with Flagyl. Her symptoms felt better while on ABX, but now are back. She has some dysuria and flank pain. Denies any abdominal pain or fever.   She does not have insurance, states PCP knows this, questions if something else can be called in. Would like Rx sent to [place], [place] [address]. Will route.

## 2017-09-26 ENCOUNTER — Ambulatory Visit: Payer: Self-pay | Admitting: Obstetrics & Gynecology

## 2017-09-30 ENCOUNTER — Encounter: Payer: Self-pay | Admitting: Osteopathic Medicine

## 2017-10-09 DIAGNOSIS — F3341 Major depressive disorder, recurrent, in partial remission: Secondary | ICD-10-CM | POA: Diagnosis not present

## 2017-10-14 DIAGNOSIS — F3341 Major depressive disorder, recurrent, in partial remission: Secondary | ICD-10-CM | POA: Diagnosis not present

## 2017-10-15 DIAGNOSIS — F3341 Major depressive disorder, recurrent, in partial remission: Secondary | ICD-10-CM | POA: Diagnosis not present

## 2017-10-21 DIAGNOSIS — F3341 Major depressive disorder, recurrent, in partial remission: Secondary | ICD-10-CM | POA: Diagnosis not present

## 2017-10-22 ENCOUNTER — Other Ambulatory Visit: Payer: BLUE CROSS/BLUE SHIELD

## 2017-10-22 ENCOUNTER — Ambulatory Visit (HOSPITAL_BASED_OUTPATIENT_CLINIC_OR_DEPARTMENT_OTHER): Payer: BLUE CROSS/BLUE SHIELD

## 2017-10-22 ENCOUNTER — Ambulatory Visit (INDEPENDENT_AMBULATORY_CARE_PROVIDER_SITE_OTHER): Payer: BLUE CROSS/BLUE SHIELD | Admitting: Osteopathic Medicine

## 2017-10-22 ENCOUNTER — Inpatient Hospital Stay (HOSPITAL_BASED_OUTPATIENT_CLINIC_OR_DEPARTMENT_OTHER): Admission: RE | Admit: 2017-10-22 | Payer: Self-pay | Source: Ambulatory Visit

## 2017-10-22 ENCOUNTER — Other Ambulatory Visit: Payer: Self-pay | Admitting: Osteopathic Medicine

## 2017-10-22 ENCOUNTER — Ambulatory Visit (HOSPITAL_BASED_OUTPATIENT_CLINIC_OR_DEPARTMENT_OTHER)
Admission: RE | Admit: 2017-10-22 | Discharge: 2017-10-22 | Disposition: A | Discharge status: Home / Self Care | Payer: BLUE CROSS/BLUE SHIELD | Source: Ambulatory Visit | Attending: Osteopathic Medicine | Admitting: Osteopathic Medicine

## 2017-10-22 ENCOUNTER — Telehealth: Payer: Self-pay

## 2017-10-22 ENCOUNTER — Other Ambulatory Visit (HOSPITAL_BASED_OUTPATIENT_CLINIC_OR_DEPARTMENT_OTHER): Payer: Self-pay

## 2017-10-22 ENCOUNTER — Encounter: Payer: Self-pay | Admitting: Osteopathic Medicine

## 2017-10-22 ENCOUNTER — Other Ambulatory Visit (HOSPITAL_COMMUNITY)
Admission: RE | Admit: 2017-10-22 | Discharge: 2017-10-22 | Disposition: A | Discharge status: Home / Self Care | Payer: BLUE CROSS/BLUE SHIELD | Source: Ambulatory Visit | Attending: Family Medicine | Admitting: Family Medicine

## 2017-10-22 VITALS — BP 124/73 | HR 97 | Temp 98.7°F | Wt 234.6 lb

## 2017-10-22 DIAGNOSIS — F3170 Bipolar disorder, currently in remission, most recent episode unspecified: Secondary | ICD-10-CM | POA: Diagnosis not present

## 2017-10-22 DIAGNOSIS — Z87898 Personal history of other specified conditions: Secondary | ICD-10-CM

## 2017-10-22 DIAGNOSIS — N926 Irregular menstruation, unspecified: Secondary | ICD-10-CM

## 2017-10-22 DIAGNOSIS — Z124 Encounter for screening for malignant neoplasm of cervix: Secondary | ICD-10-CM

## 2017-10-22 DIAGNOSIS — Z87440 Personal history of urinary (tract) infections: Secondary | ICD-10-CM

## 2017-10-22 DIAGNOSIS — R102 Pelvic and perineal pain: Secondary | ICD-10-CM

## 2017-10-22 DIAGNOSIS — Z6841 Body Mass Index (BMI) 40.0 and over, adult: Secondary | ICD-10-CM

## 2017-10-22 DIAGNOSIS — Z113 Encounter for screening for infections with a predominantly sexual mode of transmission: Secondary | ICD-10-CM

## 2017-10-22 DIAGNOSIS — D259 Leiomyoma of uterus, unspecified: Secondary | ICD-10-CM | POA: Diagnosis not present

## 2017-10-22 LAB — POCT URINE PREGNANCY: Preg Test, Ur: NEGATIVE

## 2017-10-22 NOTE — Telephone Encounter (Signed)
Jen from imaging, called stating pt arrived 1/2 hr late for their U/S appt. Pt had to be rescheduled. She wanted to make provider aware of the situation. Pt was upset about being rescheduled. Imaging was unable to provide another appt for today due to a booked schedule.

## 2017-10-22 NOTE — Progress Notes (Signed)
HPI: Tasha Barrett is a 27 y.o. female who  has a past medical history of Anxiety, Bipolar 2 disorder ([place]), Bipolar disorder ([place]), Depression, Pneumonia, and Yeast infection.  she presents to [facility] today, 10/22/17,  for chief complaint of:  Menstrual issues Requests lab work for diabetes check, blood draw for pregnancy levels   Reports irregular periods worse over the past couple of months, no significant spotting between periods but sometimes.  Will be early or late and last up to 17 days with some heavy bleeding and pelvic pain/cramping.  She would like checked for pregnancy/STD "just in case"  No urinary symptoms such as frequency, dysuria, hematuria.  She would like urine checked for UTI given history of infections.  She has also had recurrent BV.  No concerns for unusual vaginal discharge now.  Medical assistant reports patient was very upset about her weight, to the point of tears.  She is nervous to get on birth control pills the other kind of hormonal treatment, does not want to cause weight gain. Wt Readings from Last 3 Encounters:  10/22/17 234 lb 9.6 oz (106.4 kg)  04/11/17 221 lb 1.3 oz (100.3 kg)  08/08/16 215 lb (97.5 kg)       Past medical history, surgical history, and family history reviewed.  Current medication list and allergy/intolerance information reviewed.   (See remainder of HPI, ROS, Phys Exam below)       ASSESSMENT/PLAN: The primary encounter diagnosis was Irregular menstruation. Diagnoses of Bipolar affective disorder in remission ([place]), History of prediabetes, BMI 40.0-44.9, adult ([place]), History of UTI, Cervical cancer screening, Pelvic pain, and Routine screening for STI (sexually transmitted infection) were also pertinent to this visit.   If irregular periods bother her, could certainly consider hormonal contraceptive such as OCP/Mirena, patient is very reluctant to get on any kind of pills for this.  Would  certainly consider diagnosis of PCOS given history of prediabetes, weight gain, irregular periods.  Patient would like to hold off on any interventions for now until we have lab work and ultrasound results back, I think this is reasonable.  Orders Placed This Encounter  Procedures  . C. trachomatis/N. gonorrhoeae RNA  . WET PREP FOR TRICH, YEAST, CLUE  . US Transvaginal Non-OB  . US PELVIS (TRANSABDOMINAL ONLY)  . CBC  . COMPLETE METABOLIC PANEL WITH GFR  . Lipid panel  . TSH  . Hemoglobin A1c  . B-HCG Quant  . Urinalysis, Routine w reflex microscopic  . HIV Antibody (routine testing w rflx)  . RPR  . POCT urine pregnancy     Follow-up plan: Return for recheck based on results - will call.                           ############################################ ############################################ ############################################ ############################################    Outpatient Encounter Medications as of 10/22/2017  Medication Sig  . Benzocaine-Menthol 15-4 MG LOZG Use as directed 1 lozenge in the mouth or throat 3 (three) times daily as needed.  . Cephalexin 500 MG tablet Take 1 tablet (500 mg total) by mouth 2 (two) times daily.  . Lidocaine HCl 2 % SOLN Use as directed 10-15 mLs in the mouth or throat every 3 (three) hours as needed (throat pain).  . metroNIDAZOLE (FLAGYL) 500 MG tablet Take 1 tablet (500 mg total) by mouth 2 (two) times daily. For BV  . predniSONE (DELTASONE) 20 MG tablet 3 tabs po day  one, then 2 po daily x 4 days  . [DISCONTINUED] traZODone (DESYREL) 50 MG tablet Take 1 tablet (50 mg total) by mouth at bedtime and may repeat dose one time if needed. For sleep   No facility-administered encounter medications on file as of 10/22/2017.    Allergies  Allergen Reactions  . Amoxicillin-Pot Clavulanate Other (See Comments)    As child - pt reports NO Hx anaphylaxis, thinks was hives  . Latex Other (See  Comments)    Possible yeast infection.      Review of Systems:  Constitutional: No recent illness, +weight gain concerns   Cardiac: No  chest pain, No  pressure, No palpitations  Respiratory:  No  shortness of breath. No  Cough  Gastrointestinal: No  abdominal pain  Musculoskeletal: No new myalgia/arthralgia  Skin: No  Rash  Psychiatric: +concerns with depression, No  concerns with anxiety  Exam:  BP 124/73 (BP Location: Left Arm, Patient Position: Sitting, Cuff Size: Large)   Pulse 97   Temp 98.7 F (37.1 C) (Oral)   Wt 234 lb 9.6 oz (106.4 kg)   BMI 42.91 kg/m   Constitutional: VS see above. General Appearance: alert, well-developed, well-nourished, NAD  Eyes: Normal lids and conjunctive, non-icteric sclera  Ears, Nose, Mouth, Throat: MMM, Normal external inspection ears/nares/mouth/lips/gums.  Neck: No masses, trachea midline.   Respiratory: Normal respiratory effort. no wheeze, no rhonchi, no rales  Cardiovascular: S1/S2 normal, no murmur, no rub/gallop auscultated. RRR.   Musculoskeletal: Gait normal. Symmetric and independent movement of all extremities  Neurological: Normal balance/coordination. No tremor.  Skin: warm, dry, intact.   Psychiatric: Normal judgment/insight. Normal mood and affect. Oriented x3.  GYN: No lesions/ulcers to external genitalia, normal urethra, normal vaginal mucosa, physiologic discharge w/ ?yeast type discharge whitish curds, cervix normal without lesions, uterus not enlarged or tender, adnexa no masses and nontender   Visit summary with medication list and pertinent instructions was printed for patient to review, advised to alert [place] if any changes needed. All questions at time of visit were answered - patient instructed to contact office with any additional concerns. ER/RTC precautions were reviewed with the patient and understanding verbalized.   Follow-up plan: Return for recheck based on results - will call.    Please  note: voice recognition software was used to produce this document, and typos may escape review. Please contact Dr. Sheppard Coil for any needed clarifications.

## 2017-10-22 NOTE — Telephone Encounter (Signed)
It is patient's responsibility to be on time for their appointments, I am sorry that she is disappointed but nothing I can do, thanks for the [facility]

## 2017-10-22 NOTE — Patient Instructions (Signed)
Plan:  Option to try different birth control pills, or could also consider a hormonal IUD such as the Mirena or vaginal NuvaRing - these give continuous dosing of hormones therefore decreased fluctuations in hormone levels may result in less side effect problems  Given on and off pelvic pain, I would like to get an ultrasound of the uterus and ovaries

## 2017-10-22 NOTE — Telephone Encounter (Signed)
Noted  

## 2017-10-23 LAB — CBC
HEMATOCRIT: 33.9 % — AB (ref 35.0–45.0)
HEMOGLOBIN: 10.7 g/dL — AB (ref 11.7–15.5)
MCH: 24.5 pg — AB (ref 27.0–33.0)
MCHC: 31.6 g/dL — ABNORMAL LOW (ref 32.0–36.0)
MCV: 77.8 fL — AB (ref 80.0–100.0)
MPV: 11.4 fL (ref 7.5–12.5)
Platelets: 302 10*3/uL (ref 140–400)
RBC: 4.36 10*6/uL (ref 3.80–5.10)
RDW: 13 % (ref 11.0–15.0)
WBC: 6.4 10*3/uL (ref 3.8–10.8)

## 2017-10-23 LAB — HEMOGLOBIN A1C
EAG (MMOL/L): 6 (calc)
HEMOGLOBIN A1C: 5.4 %{Hb} (ref <5.7)
Mean Plasma Glucose: 108 (calc)

## 2017-10-23 LAB — COMPLETE METABOLIC PANEL WITH GFR
AG RATIO: 1.1 (calc) (ref 1.0–2.5)
ALBUMIN MSPROF: 4.1 g/dL (ref 3.6–5.1)
ALT: 10 U/L (ref 6–29)
AST: 14 U/L (ref 10–30)
Alkaline phosphatase (APISO): 66 U/L (ref 33–115)
BILIRUBIN TOTAL: 0.4 mg/dL (ref 0.2–1.2)
BUN: 12 mg/dL (ref 7–25)
CALCIUM: 9.5 mg/dL (ref 8.6–10.2)
CHLORIDE: 102 mmol/L (ref 98–110)
CO2: 28 mmol/L (ref 20–32)
Creat: 0.83 mg/dL (ref 0.50–1.10)
GFR, EST AFRICAN AMERICAN: 112 mL/min/{1.73_m2} (ref >=60)
GFR, EST NON AFRICAN AMERICAN: 97 mL/min/{1.73_m2} (ref >=60)
GLOBULIN: 3.6 g/dL (ref 1.9–3.7)
Glucose, Bld: 88 mg/dL (ref 65–99)
POTASSIUM: 4.7 mmol/L (ref 3.5–5.3)
Sodium: 136 mmol/L (ref 135–146)
TOTAL PROTEIN: 7.7 g/dL (ref 6.1–8.1)

## 2017-10-23 LAB — HIV ANTIBODY (ROUTINE TESTING W REFLEX): HIV: NONREACTIVE

## 2017-10-23 LAB — URINALYSIS, ROUTINE W REFLEX MICROSCOPIC
BILIRUBIN URINE: NEGATIVE
GLUCOSE, UA: NEGATIVE
HGB URINE DIPSTICK: NEGATIVE
Ketones, ur: NEGATIVE
Leukocytes, UA: NEGATIVE
Nitrite: NEGATIVE
PROTEIN: NEGATIVE
Specific Gravity, Urine: 1.015 (ref 1.001–1.03)
pH: 6 (ref 5.0–8.0)

## 2017-10-23 LAB — C. TRACHOMATIS/N. GONORRHOEAE RNA
C. trachomatis RNA, TMA: NOT DETECTED
N. gonorrhoeae RNA, TMA: NOT DETECTED

## 2017-10-23 LAB — HCG, QUANTITATIVE, PREGNANCY

## 2017-10-23 LAB — RPR: RPR Ser Ql: NONREACTIVE

## 2017-10-23 LAB — TSH: TSH: 1.43 mIU/L

## 2017-10-24 LAB — WET PREP FOR TRICH, YEAST, CLUE
MICRO NUMBER:: 91113728
SPECIMEN QUALITY 3963: ADEQUATE

## 2017-10-24 LAB — CYTOLOGY - PAP: DIAGNOSIS: NEGATIVE

## 2017-10-24 LAB — C. TRACHOMATIS/N. GONORRHOEAE RNA

## 2017-11-04 DIAGNOSIS — F3341 Major depressive disorder, recurrent, in partial remission: Secondary | ICD-10-CM | POA: Diagnosis not present

## 2017-11-18 DIAGNOSIS — F3341 Major depressive disorder, recurrent, in partial remission: Secondary | ICD-10-CM | POA: Diagnosis not present

## 2017-11-26 DIAGNOSIS — F3341 Major depressive disorder, recurrent, in partial remission: Secondary | ICD-10-CM | POA: Diagnosis not present

## 2017-12-23 DIAGNOSIS — F3341 Major depressive disorder, recurrent, in partial remission: Secondary | ICD-10-CM | POA: Diagnosis not present

## 2018-01-20 ENCOUNTER — Telehealth: Payer: Self-pay

## 2018-01-20 NOTE — Telephone Encounter (Signed)
I could certainly write a note to the effect of "patient reports xyz symptoms, please excuse for # days" if hit is sufficient for her employer, but would not be able to say she was evaluated in office unless she has appt   I'd like to help, but the medical board frowns on fraud ;)

## 2018-01-20 NOTE — Telephone Encounter (Signed)
Pt called & left a vm msg. Contacted pt - as per pt, she thinks she has a stomach virus. She's unable to keep food / liquid down. She is also having symptoms of vomiting and nausea for the past 2 days. Pt was offered multiple acute slots appts, however conflict with work schedule. Pt was requesting a Dr's note for missed days at work, but was informed she must be evaluated by provider beforehand. Pt was informed if symptoms worsen to visit UC./ER immediately. No other inquiries during call.

## 2018-01-21 ENCOUNTER — Encounter: Payer: Self-pay | Admitting: Osteopathic Medicine

## 2018-01-21 DIAGNOSIS — F3341 Major depressive disorder, recurrent, in partial remission: Secondary | ICD-10-CM | POA: Diagnosis not present

## 2018-01-21 NOTE — Progress Notes (Signed)
Pt reports she feels fine now so need the note for Sunday and Monday.(Dec 15 & 16). She reports symptoms of nausea, vomiting, and diarrhea. She is not scheduled to work today, and will return to work tomorrow as scheduled. Pt denied coming in for OV since she feels better now. Will route.

## 2018-01-21 NOTE — Telephone Encounter (Signed)
Printed and will leave up front to pick up

## 2018-01-21 NOTE — Telephone Encounter (Signed)
Pt reports she feels fine now so need the note for Sunday and Monday.(Dec 15 & 16). She reports symptoms of nausea, vomiting, and diarrhea. She is not scheduled to work today, and will return to work tomorrow as scheduled. Pt denied coming in for OV since she feels better now. Will route.

## 2018-01-21 NOTE — Telephone Encounter (Signed)
Pt advised.

## 2018-01-22 ENCOUNTER — Ambulatory Visit (INDEPENDENT_AMBULATORY_CARE_PROVIDER_SITE_OTHER): Payer: BLUE CROSS/BLUE SHIELD | Admitting: Osteopathic Medicine

## 2018-01-22 ENCOUNTER — Encounter: Payer: Self-pay | Admitting: Osteopathic Medicine

## 2018-01-22 VITALS — BP 128/62 | HR 71 | Temp 99.1°F | Wt 229.1 lb

## 2018-01-22 DIAGNOSIS — F329 Major depressive disorder, single episode, unspecified: Secondary | ICD-10-CM | POA: Diagnosis not present

## 2018-01-22 DIAGNOSIS — R399 Unspecified symptoms and signs involving the genitourinary system: Secondary | ICD-10-CM | POA: Diagnosis not present

## 2018-01-22 DIAGNOSIS — F319 Bipolar disorder, unspecified: Secondary | ICD-10-CM | POA: Diagnosis not present

## 2018-01-22 DIAGNOSIS — F32A Depression, unspecified: Secondary | ICD-10-CM

## 2018-01-22 LAB — POCT URINALYSIS DIPSTICK
BILIRUBIN UA: NEGATIVE
Blood, UA: NEGATIVE
GLUCOSE UA: NEGATIVE
Ketones, UA: NEGATIVE
Leukocytes, UA: NEGATIVE
Nitrite, UA: NEGATIVE
Protein, UA: NEGATIVE
Spec Grav, UA: 1.025 (ref 1.010–1.025)
Urobilinogen, UA: 2 E.U./dL — AB
pH, UA: 7 (ref 5.0–8.0)

## 2018-01-22 LAB — POCT URINE PREGNANCY: Preg Test, Ur: NEGATIVE

## 2018-01-22 MED ORDER — METRONIDAZOLE 500 MG PO TABS: 500.0000 mg | ORAL_TABLET | Freq: Two times a day (BID) | ORAL | 0 refills | Status: AC

## 2018-01-22 MED ORDER — CARIPRAZINE HCL 1.5 MG PO CAPS: 1.5000 mg | ORAL_CAPSULE | Freq: Every day | ORAL | 2 refills | Status: DC

## 2018-01-22 NOTE — Patient Instructions (Signed)
Will start Vraylar - mood stabilizer I sent Rx for BV as well

## 2018-01-22 NOTE — Progress Notes (Signed)
HPI: Tasha Barrett is a 27 y.o. female who  has a past medical history of Anxiety, Bipolar 2 disorder ([place]), Bipolar disorder ([place]), Depression, Pneumonia, and Yeast infection.  she presents to [facility] today, 01/22/18,  for chief complaint of:  Moods Concern for BV, UTI  Mood disorder, MDD, GAD - sounds like there has been some issues getting a clear diagnosis. She has tried and failed Lamictal, Geodon, Abilify, Prozac, Buspar, Elavil, Trazodone, pt reports she thinks there have been other medications but she can't remember names. Reports thoughts of death, no active suicidal ideation. Reports anxiety. She is doing really well with her new therapist.   Lower pelvic pressure, concerned about UTI or pregnancy. Would like checked today.   Depression screen [facility] 2/9 01/22/2018 08/26/2013  Decreased Interest 3 0  Down, Depressed, Hopeless 3 0  PHQ - 2 Score 6 0  Altered sleeping 3 -  Tired, decreased energy 3 -  Change in appetite 3 -  Feeling bad or failure about yourself  3 -  Trouble concentrating 3 -  Moving slowly or fidgety/restless 1 -  Suicidal thoughts 2 -  PHQ-9 Score 24 -  Difficult doing work/chores Very difficult -  Some encounter information is confidential and restricted. Go to Review Flowsheets activity to see all data.   GAD 7 : Generalized Anxiety Score 01/22/2018  Nervous, Anxious, on Edge 2  Control/stop worrying 2  Worry too much - different things 3  Trouble relaxing 2  Restless 1  Easily annoyed or irritable 2  Afraid - awful might happen 2  Total GAD 7 Score 14  Anxiety Difficulty Somewhat difficult  Some encounter information is confidential and restricted. Go to Review Flowsheets activity to see all data.    MDQ borderline.       At today's visit... Past medical history, surgical history, and family history reviewed and updated as needed.  Current medication list and allergy/intolerance information reviewed  and updated as needed. (See remainder of HPI, ROS, Phys Exam below)          ASSESSMENT/PLAN: The primary encounter diagnosis was Bipolar disorder with depression ([place]). Diagnoses of UTI symptoms and Mood disorder of depressed type were also pertinent to this visit.   Orders Placed This Encounter  Procedures  . POCT urine pregnancy  . POCT Urinalysis Dipstick     Meds ordered this encounter  Medications  . metroNIDAZOLE (FLAGYL) 500 MG tablet    Sig: Take 1 tablet (500 mg total) by mouth 2 (two) times daily for 7 days.    Dispense:  14 tablet    Refill:  0  . cariprazine (VRAYLAR) capsule    Sig: Take 1 capsule (1.5 mg total) by mouth daily.    Dispense:  30 capsule    Refill:  2    Patient Instructions  Will start Vraylar - mood stabilizer I sent Rx for BV as well   Will refer to psychiatry      Follow-up plan: Return in about 4 weeks (around 02/19/2018) for recheck moods, sooner if needed.                             ############################################ ############################################ ############################################ ############################################    No outpatient medications have been marked as taking for the 01/22/18 encounter (Office Visit) with Emeterio Reeve, DO.    Allergies  Allergen Reactions  . Amoxicillin-Pot Clavulanate Other (See Comments)  As child - pt reports NO Hx anaphylaxis, thinks was hives  . Latex Other (See Comments)    Possible yeast infection.       Review of Systems:  Constitutional: No recent illness  HEENT: No  headache, no vision change  Cardiac: No  chest pain, No  pressure, No palpitations  Respiratory:  No  shortness of breath. No  Cough  Musculoskeletal: No new myalgia/arthralgia  Skin: No  Rash  Hem/Onc: No  easy bruising/bleeding, No  abnormal lumps/bumps  Neurologic: No  weakness, No  Dizziness  Psychiatric: +concerns  with depression, +concerns with anxiety  Exam:  BP 128/62 (BP Location: Left Arm, Patient Position: Sitting, Cuff Size: Large)   Pulse 71   Temp 99.1 F (37.3 C) (Oral)   Wt 229 lb 1.6 oz (103.9 kg)   BMI 41.90 kg/m   Constitutional: VS see above. General Appearance: alert, well-developed, well-nourished, NAD  Eyes: Normal lids and conjunctive, non-icteric sclera  Ears, Nose, Mouth, Throat: MMM, Normal external inspection ears/nares/mouth/lips/gums.  Neck: No masses, trachea midline.   Respiratory: Normal respiratory effort.  Neurological: Normal balance/coordination. No tremor.  Skin: warm, dry, intact.   Psychiatric: Normal judgment/insight. Normal mood and affect. Oriented x3. No thought disorder. Somewhat pressured speech.       Visit summary with medication list and pertinent instructions was printed for patient to review, patient was advised to alert [place] if any updates are needed. All questions at time of visit were answered - patient instructed to contact office with any additional concerns. ER/RTC precautions were reviewed with the patient and understanding verbalized.   Note: Total time spent 25 minutes, greater than 50% of the visit was spent face-to-face counseling and coordinating care for the following: The primary encounter diagnosis was Bipolar disorder with depression ([place]). Diagnoses of UTI symptoms and Mood disorder of depressed type were also pertinent to this visit.Marland Kitchen  Please note: voice recognition software was used to produce this document, and typos may escape review. Please contact Dr. Sheppard Coil for any needed clarifications.    Follow up plan: Return in about 4 weeks (around 02/19/2018) for recheck moods, sooner if needed.

## 2018-01-24 ENCOUNTER — Telehealth: Payer: Self-pay | Admitting: Osteopathic Medicine

## 2018-01-24 MED ORDER — LAMOTRIGINE 25 MG PO TABS: ORAL_TABLET | ORAL | 0 refills | Status: DC

## 2018-01-24 NOTE — Telephone Encounter (Signed)
Rx for Lamictal sent Can reschedule to see me in a month

## 2018-01-24 NOTE — Telephone Encounter (Signed)
Pt advised. Appt for next week cancelled, has appt in Jan with PCP.

## 2018-01-24 NOTE — Telephone Encounter (Signed)
Routing to PCP

## 2018-01-24 NOTE — Telephone Encounter (Signed)
Pt called this morning inquiring about changing her meds. She is currently taking VRAYLAR. She stated that she doesn't feel it's a good fit for her. She has taken LAMICTAL before and would like to change back to that. She has an appointment scheduled for Monday. Pt was wondering if she needed to keep this appt., or could the prescription be changed without needing the appointment? If someone could reach out to her.

## 2018-01-27 ENCOUNTER — Ambulatory Visit: Payer: BLUE CROSS/BLUE SHIELD | Admitting: Osteopathic Medicine

## 2018-01-30 ENCOUNTER — Other Ambulatory Visit: Payer: Self-pay

## 2018-01-30 MED ORDER — LAMOTRIGINE 25 MG PO TABS: 25.0000 mg | ORAL_TABLET | Freq: Every day | ORAL | 0 refills | Status: DC

## 2018-02-06 DIAGNOSIS — F3341 Major depressive disorder, recurrent, in partial remission: Secondary | ICD-10-CM | POA: Diagnosis not present

## 2018-02-13 ENCOUNTER — Telehealth: Payer: Self-pay

## 2018-02-13 NOTE — Telephone Encounter (Signed)
Mailed letter to home address.

## 2018-02-18 DIAGNOSIS — F3341 Major depressive disorder, recurrent, in partial remission: Secondary | ICD-10-CM | POA: Diagnosis not present

## 2018-02-19 ENCOUNTER — Ambulatory Visit: Payer: Self-pay | Admitting: Osteopathic Medicine

## 2018-02-19 ENCOUNTER — Telehealth: Payer: Self-pay | Admitting: Osteopathic Medicine

## 2018-02-19 ENCOUNTER — Ambulatory Visit (INDEPENDENT_AMBULATORY_CARE_PROVIDER_SITE_OTHER): Payer: BLUE CROSS/BLUE SHIELD | Admitting: Osteopathic Medicine

## 2018-02-19 ENCOUNTER — Encounter: Payer: Self-pay | Admitting: Osteopathic Medicine

## 2018-02-19 VITALS — BP 124/65 | HR 63 | Temp 98.5°F | Wt 233.5 lb

## 2018-02-19 DIAGNOSIS — Z9189 Other specified personal risk factors, not elsewhere classified: Secondary | ICD-10-CM | POA: Diagnosis not present

## 2018-02-19 DIAGNOSIS — F329 Major depressive disorder, single episode, unspecified: Secondary | ICD-10-CM | POA: Diagnosis not present

## 2018-02-19 DIAGNOSIS — Z23 Encounter for immunization: Secondary | ICD-10-CM | POA: Diagnosis not present

## 2018-02-19 DIAGNOSIS — F32A Depression, unspecified: Secondary | ICD-10-CM

## 2018-02-19 MED ORDER — LAMOTRIGINE 100 MG PO TABS: 100.0000 mg | ORAL_TABLET | Freq: Every day | ORAL | 1 refills | Status: DC

## 2018-02-19 NOTE — Addendum Note (Signed)
Addended by: Maryla Morrow on: 02/19/2018 03:46 PM   Modules accepted: Orders

## 2018-02-19 NOTE — Telephone Encounter (Signed)
Please call patient: I reached out to Dr Joice Lofts who is a psychiatrist in [place]. She can get Ms. Diggs in for an evaluation as soon as Feb. 17. She is not technically taking new patients, so ask Ms Burnett Kanaris to mention she was referred by ME when she calls Dr. Terie Purser office to get set up: [phone]. I'll take care of the rest of the referral and record transfer, etc.

## 2018-02-19 NOTE — Telephone Encounter (Signed)
Pt advised. Will call and get scheduled. FYI to Dr Sheppard Coil

## 2018-02-19 NOTE — Telephone Encounter (Signed)
Patient called < 24 hours to cancel and reschedule appointment this morning. States that it was due to work. New appointment has been made for same day.

## 2018-02-19 NOTE — Patient Instructions (Signed)
Will work on getting you in to psychiatrist.  Let us know in about a week if you don't hear back about this referral.    Consider avoiding the following foods:  2017 UpToDate Characteristics and sources of common FODMAPs  Word that corresponds to letter in acronym Compounds in this category Foods that contain these compounds  F Fermentable  O Oligosaccharides Fructans, galacto-oligosaccharides Wheat, barley, rye, onion, leek, white part of spring onion, garlic, shallots, artichokes, beetroot, fennel, peas, chicory, pistachio, cashews, legumes, lentils, and chickpeas  D Disaccharides Lactose Milk, custard, ice cream, and yogurt  M Monosaccharides "Free fructose" (fructose in excess of glucose) Apples, pears, mangoes, cherries, watermelon, asparagus, sugar snap peas, honey, high-fructose corn syrup  A And  P Polyols Sorbitol, mannitol, maltitol, and xylitol Apples, pears, apricots, cherries, nectarines, peaches, plums, watermelon, mushrooms, cauliflower, artificially sweetened chewing gum and confectionery  FODMAPs: fermentable oligosaccharides, disaccharides, monosaccharides, and polyols. Adapted by permission from [employer]: [employer]. Agustin Cree, [employer], [place]. Short-chain carbohydrates and functional gastrointestinal disorders. Am J Gastroenterol 2013; 108:707. Copyright  2013. www.nature.com/ajg. Graphic [phone] Version 2.0

## 2018-02-19 NOTE — Progress Notes (Signed)
HPI: Tasha Barrett is a 28 y.o. female who  has a past medical history of Anxiety, Bipolar 2 disorder ([place]), Bipolar disorder ([place]), Depression, Pneumonia, and Yeast infection.  she presents to [facility] today, 02/19/18,  for chief complaint of:  Moods: follow-up  Mood disorder, MDD, GAD - sounds like there has been some issues getting a clear diagnosis. She has tried and failed Lamictal, Geodon, Abilify, Prozac, Buspar, Elavil, Trazodone, pt reports she thinks there have been other medications but she can't remember names.   Last visit 01/22/18 we decided to start [place]. She called 01/24/18 wanting to get back on Lamictal. Here today to follow up on this. Vraylar "not a good fit" she did some reading and had concerns that this medicine was more for Bipolar 1 which she doesn't feel she has.  MDQ borderline results.     Requests sleep study, concerned abotu sleep apnea.   STOP-BANG for SLEEP APNEA Do you Snore loudly? Yes Do you often feel Tired during day? Yes Has anyone Observed you stop breathing? Yes History of high blood Pressure? No BMI >35? Yes Age >50? No Neck circumference >16 in? Yes Gender female? No 5-8 = high risk 3-4 = intermediate 0-2 = low risk     At today's visit... Past medical history, surgical history, and family history reviewed and updated as needed.  Current medication list and allergy/intolerance information reviewed and updated as needed. (See remainder of HPI, ROS, Phys Exam below)          ASSESSMENT/PLAN: The primary encounter diagnosis was Mood disorder of depressed type. Diagnoses of Need for influenza vaccination and At risk for sleep apnea were also pertinent to this visit.   Orders Placed This Encounter  Procedures  . Flu Vaccine QUAD 6+ mos PF IM (Fluarix Quad PF)  . Ambulatory referral to Sleep Studies     Meds ordered this encounter  Medications  . lamoTRIgine (LAMICTAL) 100 MG tablet     Sig: Take 1 tablet (100 mg total) by mouth daily for 7 days.    Dispense:  90 tablet    Refill:  1    Patient Instructions   Will work on getting you in to psychiatrist.  Let us know in about a week if you don't hear back about this referral.    Consider avoiding the following foods:  2017 UpToDate Characteristics and sources of common FODMAPs  Word that corresponds to letter in acronym Compounds in this category Foods that contain these compounds  F Fermentable  O Oligosaccharides Fructans, galacto-oligosaccharides Wheat, barley, rye, onion, leek, white part of spring onion, garlic, shallots, artichokes, beetroot, fennel, peas, chicory, pistachio, cashews, legumes, lentils, and chickpeas  D Disaccharides Lactose Milk, custard, ice cream, and yogurt  M Monosaccharides "Free fructose" (fructose in excess of glucose) Apples, pears, mangoes, cherries, watermelon, asparagus, sugar snap peas, honey, high-fructose corn syrup  A And  P Polyols Sorbitol, mannitol, maltitol, and xylitol Apples, pears, apricots, cherries, nectarines, peaches, plums, watermelon, mushrooms, cauliflower, artificially sweetened chewing gum and confectionery  FODMAPs: fermentable oligosaccharides, disaccharides, monosaccharides, and polyols. Adapted by permission from [employer]: [employer]. Agustin Cree, [employer], [place]. Short-chain carbohydrates and functional gastrointestinal disorders. Am J Gastroenterol 2013; 108:707. Copyright  2013. www.nature.com/ajg. Graphic [phone] Version 2.0   Will refer to psychiatry  Dr. Randa Spike [phone]    Follow-up plan: Return for recheck depending on moods, otherwise will try to get you in to psych  and go from there  .                               ############################################ ############################################ ############################################ ############################################    Current Meds  Medication Sig  . lamoTRIgine (LAMICTAL) 100 MG tablet Take 1 tablet (100 mg total) by mouth daily for 7 days.  . [DISCONTINUED] lamoTRIgine (LAMICTAL) 25 MG tablet Take 1 tablet (25 mg total) by mouth daily for 7 days, THEN 2 tablets (50 mg total) daily for 7 days, THEN 3 tablets (75 mg total) daily for 14 days.    Allergies  Allergen Reactions  . Amoxicillin-Pot Clavulanate Other (See Comments)    As child - pt reports NO Hx anaphylaxis, thinks was hives  . Latex Other (See Comments)    Possible yeast infection.       Review of Systems:  Constitutional: No recent illness  HEENT: No  headache, no vision change  Cardiac: No  chest pain, No  pressure, No palpitations  Respiratory:  No  shortness of breath. No  Cough  Hem/Onc: No  easy bruising/bleeding, No  abnormal lumps/bumps  Neurologic: No  weakness, No  Dizziness  Psychiatric: +concerns with depression, +concerns with anxiety  Exam:  BP 124/65 (BP Location: Left Arm, Patient Position: Sitting, Cuff Size: Large)   Pulse 63   Temp 98.5 F (36.9 C) (Oral)   Wt 233 lb 8 oz (105.9 kg)   BMI 42.71 kg/m   Constitutional: VS see above. General Appearance: alert, well-developed, well-nourished, NAD  Eyes: Normal lids and conjunctive, non-icteric sclera  Ears, Nose, Mouth, Throat: MMM, Normal external inspection ears/nares/mouth/lips/gums.  Neck: No masses, trachea midline.   Respiratory: Normal respiratory effort.  Neurological: Normal balance/coordination. No tremor.  Skin: warm, dry, intact.   Psychiatric: Normal judgment/insight. Normal mood and affect. Oriented x3. No thought disorder. Seems more calm at today's visit than last time      Visit  summary with medication list and pertinent instructions was printed for patient to review, patient was advised to alert [place] if any updates are needed. All questions at time of visit were answered - patient instructed to contact office with any additional concerns. ER/RTC precautions were reviewed with the patient and understanding verbalized.   Note: Total time spent 25 minutes, greater than 50% of the visit was spent face-to-face counseling and coordinating care for the following: The primary encounter diagnosis was Mood disorder of depressed type. Diagnoses of Need for influenza vaccination and At risk for sleep apnea were also pertinent to this visit.Marland Kitchen  Please note: voice recognition software was used to produce this document, and typos may escape review. Please contact Dr. Sheppard Coil for any needed clarifications.    Follow up plan: No follow-ups on file.

## 2018-03-05 DIAGNOSIS — F3341 Major depressive disorder, recurrent, in partial remission: Secondary | ICD-10-CM | POA: Diagnosis not present

## 2018-03-06 DIAGNOSIS — F3341 Major depressive disorder, recurrent, in partial remission: Secondary | ICD-10-CM | POA: Diagnosis not present

## 2018-03-18 DIAGNOSIS — F3341 Major depressive disorder, recurrent, in partial remission: Secondary | ICD-10-CM | POA: Diagnosis not present

## 2018-03-19 DIAGNOSIS — F3341 Major depressive disorder, recurrent, in partial remission: Secondary | ICD-10-CM | POA: Diagnosis not present

## 2018-03-20 DIAGNOSIS — F900 Attention-deficit hyperactivity disorder, predominantly inattentive type: Secondary | ICD-10-CM | POA: Diagnosis not present

## 2018-03-20 DIAGNOSIS — F329 Major depressive disorder, single episode, unspecified: Secondary | ICD-10-CM | POA: Diagnosis not present

## 2018-03-25 DIAGNOSIS — F900 Attention-deficit hyperactivity disorder, predominantly inattentive type: Secondary | ICD-10-CM | POA: Diagnosis not present

## 2018-03-25 DIAGNOSIS — F329 Major depressive disorder, single episode, unspecified: Secondary | ICD-10-CM | POA: Diagnosis not present

## 2018-03-26 DIAGNOSIS — F3341 Major depressive disorder, recurrent, in partial remission: Secondary | ICD-10-CM | POA: Diagnosis not present

## 2018-04-10 DIAGNOSIS — F3341 Major depressive disorder, recurrent, in partial remission: Secondary | ICD-10-CM | POA: Diagnosis not present

## 2018-04-11 DIAGNOSIS — F3341 Major depressive disorder, recurrent, in partial remission: Secondary | ICD-10-CM | POA: Diagnosis not present

## 2018-04-14 DIAGNOSIS — F900 Attention-deficit hyperactivity disorder, predominantly inattentive type: Secondary | ICD-10-CM | POA: Diagnosis not present

## 2018-04-14 DIAGNOSIS — F329 Major depressive disorder, single episode, unspecified: Secondary | ICD-10-CM | POA: Diagnosis not present

## 2018-04-14 DIAGNOSIS — F4310 Post-traumatic stress disorder, unspecified: Secondary | ICD-10-CM | POA: Diagnosis not present

## 2018-04-23 ENCOUNTER — Ambulatory Visit (INDEPENDENT_AMBULATORY_CARE_PROVIDER_SITE_OTHER): Payer: BLUE CROSS/BLUE SHIELD | Admitting: Osteopathic Medicine

## 2018-04-23 ENCOUNTER — Ambulatory Visit: Payer: Self-pay | Admitting: Osteopathic Medicine

## 2018-04-23 ENCOUNTER — Telehealth: Payer: Self-pay | Admitting: Osteopathic Medicine

## 2018-04-23 ENCOUNTER — Other Ambulatory Visit: Payer: Self-pay

## 2018-04-23 ENCOUNTER — Encounter: Payer: Self-pay | Admitting: Osteopathic Medicine

## 2018-04-23 VITALS — BP 102/65 | HR 85 | Temp 98.7°F | Wt 233.4 lb

## 2018-04-23 DIAGNOSIS — R059 Cough, unspecified: Secondary | ICD-10-CM

## 2018-04-23 DIAGNOSIS — R05 Cough: Secondary | ICD-10-CM

## 2018-04-23 NOTE — Patient Instructions (Signed)
Medications & Home Remedies for Upper Respiratory Illness   Note: the following list assumes no pregnancy, normal liver & kidney function and no other drug interactions. Dr. Sheppard Coil has highlighted medications which are safe for you to use, but these may not be appropriate for everyone. Always ask a pharmacist or qualified medical provider if you have any questions!    Aches/Pains, Fever, Headache OTC Acetaminophen (Tylenol) 500 mg tablets - take max 2 tablets (1000 mg) every 6 hours (4 times per day)  OTC Ibuprofen (Motrin) 200 mg tablets - take max 4 tablets (800 mg) every 6 hours*   Sinus Congestion OTC Nasal Saline if desired to rinse OTC Oxymetolazone (Afrin, others) sparing use due to rebound congestion, NEVER use in kids OTC Phenylephrine (Sudafed) 10 mg tablets every 4 hours (or the 12-hour formulation)* OTC Diphenhydramine (Benadryl) 25 mg tablets - take max 2 tablets every 4 hours   Cough & Sore Throat OTC Dextromethorphan (Robitussin, others) - cough suppressant OTC Guaifenesin (Robitussin, Mucinex, others) - expectorant (helps cough up mucus) (Dextromethorphan and Guaifenesin also come in a combination tablet/syrup) OTC Lozenges w/ Benzocaine + Menthol (Cepacol) Honey - as much as you want! Teas which "coat the throat" - look for ingredients Elm Bark, Licorice Root, Marshmallow Root   Other OTC Zinc Lozenges within 24 hours of symptoms onset - mixed evidence this shortens the duration of the common cold Don't waste your money on Vitamin C or Echinacea in acute illness - it's already too late!    *Caution in patients with high blood pressure

## 2018-04-23 NOTE — Progress Notes (Signed)
HPI: Tasha Barrett is a 28 y.o. female who  has a past medical history of Anxiety, Bipolar 2 disorder ([place]), Bipolar disorder ([place]), Depression, Pneumonia, and Yeast infection.  she presents to [facility] today, 04/23/18,  for chief complaint of: Employer made her come to office   Coronavirus scare currently PT feels fine, states her employer told her to come get checked out given cough.  Pt given printout and reference for DCD guidelines for employers which recommends against non-critical doctor visits/notes for illness   Review of Systems:  Constitutional:  No  fever, no chills, +recent illness, No unintentional weight changes. No significant fatigue.   HEENT: No  headache, no vision change, no hearing change, No sore throat, +sinus pressure  Cardiac: No  chest pain, No  pressure, No palpitations  Respiratory:  No  shortness of breath. +Cough  Neurologic: No  weakness, No  dizziness  Exam:  BP 102/65 (BP Location: Left Arm, Patient Position: Sitting, Cuff Size: Large)   Pulse 85   Temp 98.7 F (37.1 C) (Oral)   Wt 233 lb 6.4 oz (105.9 kg)   BMI 42.69 kg/m   Constitutional: VS see above. General Appearance: alert, well-developed, well-nourished, NAD  Neck: No masses, trachea midline.   Respiratory: Normal respiratory effort.     ASSESSMENT/PLAN: The encounter diagnosis was Cough.    Patient Instructions  Medications & Home Remedies for Upper Respiratory Illness   Note: the following list assumes no pregnancy, normal liver & kidney function and no other drug interactions. Dr. Sheppard Coil has highlighted medications which are safe for you to use, but these may not be appropriate for everyone. Always ask a pharmacist or qualified medical provider if you have any questions!    Aches/Pains, Fever, Headache OTC Acetaminophen (Tylenol) 500 mg tablets - take max 2 tablets (1000 mg) every 6 hours (4 times per day)  OTC Ibuprofen  (Motrin) 200 mg tablets - take max 4 tablets (800 mg) every 6 hours*   Sinus Congestion OTC Nasal Saline if desired to rinse OTC Oxymetolazone (Afrin, others) sparing use due to rebound congestion, NEVER use in kids OTC Phenylephrine (Sudafed) 10 mg tablets every 4 hours (or the 12-hour formulation)* OTC Diphenhydramine (Benadryl) 25 mg tablets - take max 2 tablets every 4 hours   Cough & Sore Throat OTC Dextromethorphan (Robitussin, others) - cough suppressant OTC Guaifenesin (Robitussin, Mucinex, others) - expectorant (helps cough up mucus) (Dextromethorphan and Guaifenesin also come in a combination tablet/syrup) OTC Lozenges w/ Benzocaine + Menthol (Cepacol) Honey - as much as you want! Teas which "coat the throat" - look for ingredients Elm Bark, Licorice Root, Marshmallow Root   Other OTC Zinc Lozenges within 24 hours of symptoms onset - mixed evidence this shortens the duration of the common cold Don't waste your money on Vitamin C or Echinacea in acute illness - it's already too late!    *Caution in patients with high blood pressure               Visit summary with medication list and pertinent instructions was printed for patient to review. All questions at time of visit were answered - patient instructed to contact office with any additional concerns or updates. ER/RTC precautions were reviewed with the patient.   Follow-up plan: Return if symptoms worsen or fail to improve.   Please note: voice recognition software was used to produce this document, and typos may escape review. Please contact Dr. Sheppard Coil for any needed  clarifications.

## 2018-04-23 NOTE — Telephone Encounter (Signed)
triaged patient:  Low grade temp off and on since Monday, no higher than 99.5 Dry cough. Not around anyone else sick. No other symptoms. Does suffer from seasonal allergies. Recently started new Rx, adderall. Trouble sleeping, increased fatigue. Reports colleague at work advised her that adderall can cause her body temperature to increase.   Advised to keep OV today and wear a mask. Verbalized understanding.

## 2018-04-23 NOTE — Telephone Encounter (Signed)
Noted, thanks!

## 2018-04-24 DIAGNOSIS — F3341 Major depressive disorder, recurrent, in partial remission: Secondary | ICD-10-CM | POA: Diagnosis not present

## 2018-04-25 DIAGNOSIS — F3341 Major depressive disorder, recurrent, in partial remission: Secondary | ICD-10-CM | POA: Diagnosis not present

## 2018-05-08 DIAGNOSIS — F3341 Major depressive disorder, recurrent, in partial remission: Secondary | ICD-10-CM | POA: Diagnosis not present

## 2018-05-12 DIAGNOSIS — F3341 Major depressive disorder, recurrent, in partial remission: Secondary | ICD-10-CM | POA: Diagnosis not present

## 2018-05-20 DIAGNOSIS — F3341 Major depressive disorder, recurrent, in partial remission: Secondary | ICD-10-CM | POA: Diagnosis not present

## 2018-05-22 DIAGNOSIS — F3341 Major depressive disorder, recurrent, in partial remission: Secondary | ICD-10-CM | POA: Diagnosis not present

## 2018-05-28 DIAGNOSIS — F3341 Major depressive disorder, recurrent, in partial remission: Secondary | ICD-10-CM | POA: Diagnosis not present

## 2018-05-29 DIAGNOSIS — F3341 Major depressive disorder, recurrent, in partial remission: Secondary | ICD-10-CM | POA: Diagnosis not present

## 2018-05-30 DIAGNOSIS — F3341 Major depressive disorder, recurrent, in partial remission: Secondary | ICD-10-CM | POA: Diagnosis not present

## 2018-06-04 DIAGNOSIS — F4310 Post-traumatic stress disorder, unspecified: Secondary | ICD-10-CM | POA: Diagnosis not present

## 2018-06-04 DIAGNOSIS — F900 Attention-deficit hyperactivity disorder, predominantly inattentive type: Secondary | ICD-10-CM | POA: Diagnosis not present

## 2018-06-04 DIAGNOSIS — F329 Major depressive disorder, single episode, unspecified: Secondary | ICD-10-CM | POA: Diagnosis not present

## 2018-06-05 DIAGNOSIS — F3341 Major depressive disorder, recurrent, in partial remission: Secondary | ICD-10-CM | POA: Diagnosis not present

## 2018-06-10 DIAGNOSIS — F900 Attention-deficit hyperactivity disorder, predominantly inattentive type: Secondary | ICD-10-CM | POA: Diagnosis not present

## 2018-06-10 DIAGNOSIS — F4310 Post-traumatic stress disorder, unspecified: Secondary | ICD-10-CM | POA: Diagnosis not present

## 2018-06-10 DIAGNOSIS — F329 Major depressive disorder, single episode, unspecified: Secondary | ICD-10-CM | POA: Diagnosis not present

## 2018-06-12 DIAGNOSIS — F3341 Major depressive disorder, recurrent, in partial remission: Secondary | ICD-10-CM | POA: Diagnosis not present

## 2018-06-18 DIAGNOSIS — F3341 Major depressive disorder, recurrent, in partial remission: Secondary | ICD-10-CM | POA: Diagnosis not present

## 2018-06-19 DIAGNOSIS — F3341 Major depressive disorder, recurrent, in partial remission: Secondary | ICD-10-CM | POA: Diagnosis not present

## 2018-06-25 ENCOUNTER — Ambulatory Visit: Payer: BLUE CROSS/BLUE SHIELD | Admitting: Family Medicine

## 2018-06-25 ENCOUNTER — Encounter: Payer: Self-pay | Admitting: Family Medicine

## 2018-06-25 VITALS — BP 108/72 | HR 67 | Resp 16 | Ht 62.0 in | Wt 246.0 lb

## 2018-06-25 DIAGNOSIS — M7041 Prepatellar bursitis, right knee: Secondary | ICD-10-CM

## 2018-06-25 DIAGNOSIS — S93492A Sprain of other ligament of left ankle, initial encounter: Secondary | ICD-10-CM | POA: Diagnosis not present

## 2018-06-25 DIAGNOSIS — S39012A Strain of muscle, fascia and tendon of lower back, initial encounter: Secondary | ICD-10-CM | POA: Diagnosis not present

## 2018-06-25 MED ORDER — DICLOFENAC SODIUM 1 % TD GEL: 4.0000 g | Freq: Four times a day (QID) | TRANSDERMAL | 11 refills | Status: DC
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
## 2018-06-25 NOTE — Patient Instructions (Addendum)
Thank you for coming in today.  Ok to use diclofenac gel on the knee and ankle 4x daily.   I think the knee pain is prepatellar bursitis.  Ok to use ice and diclofenac gel.   If not improving next step is Physical therapy and imaging.      Prepatellar Bursitis Rehab Ask your health care provider which exercises are safe for you. Do exercises exactly as told by your health care provider and adjust them as directed. It is normal to feel mild stretching, pulling, tightness, or discomfort as you do these exercises, but you should stop right away if you feel sudden pain or your pain gets worse.Do not begin these exercises until told by your health care provider. Stretching and range of motion exercises These exercises warm up your muscles and joints and improve the movement and flexibility of your knee. These exercises also help to relieve pain, numbness, and tingling. Exercise A: Hamstring, standing  1. Stand with your __________ foot resting on a chair. Your __________ leg should be fully extended. 2. Arch your lower back slightly. 3. Leading with your chest, lean forward at the waist until you feel a gentle stretch in the back of your __________ knee or in your thigh. You should not need to lean far to feel a stretch. 4. Hold this position for __________ seconds. Repeat __________ times. Complete this stretch __________ times a day. Exercise B: Knee flexion, active heel slides 1. Lie on your back with both knees straight. If this causes back discomfort, bend your __________ knee so your foot is flat on the floor. 2. Slowly slide your __________ heel back toward your buttocks until you feel a gentle stretch in the front of your knee or thigh. 3. Hold this position for __________ seconds. 4. Slowly slide your __________ heel back to the starting position. Repeat __________ times. Complete this stretch __________ times a day. Strengthening exercises These exercises build strength and  endurance in your knee. Endurance is the ability to use your muscles for a long time, even after they get tired. Exercise C: Quadriceps, isometric  1. Lie on your back with your __________ leg extended and your __________ knee bent. 2. Slowly tense the muscles in the front of your __________ thigh. When you do this, you should see your kneecap slide up toward your hip or see increased dimpling just above the knee. This motion will push the back of your knee down toward the surface that is under it. 3. For __________ seconds, keep the muscle as tight as you can without increasing your pain. 4. Relax the muscles slowly and completely. Repeat __________ times. Complete this exercise __________ times a day. Exercise D: Straight leg raises (quadriceps) 1. Lie on your back with your __________ leg extended and your __________ knee bent. 2. Slowly tense the muscles in your __________ thigh. When you do this, you should see your kneecap slide up toward your hip or see increased dimpling just above the knee. 3. Keep these muscles tight as you raise your leg 4-6 inches (10-15 cm) off the floor. 4. Hold this position for __________ seconds. 5. Keep these muscles tense as you lower your leg slowly. 6. Relax your muscles slowly and completely. Repeat __________ times. Complete this exercise __________ times a day. Exercise E: Straight leg raises (hip extensors) 1. Lie on your abdomen on a bed or a firm surface. You can put a pillow under your hips if that is more comfortable. 2. Tense your buttock  muscles and lift your __________ thigh. Your __________ knee can be bent or straight, but do not let your back arch. 3. Hold this position for __________ seconds. 4. Slowly lower your leg to the starting position. 5. Let your muscles relax completely. Repeat __________ times. Complete this exercise __________ times a day. This information is not intended to replace advice given to you by your health care  provider. Make sure you discuss any questions you have with your health care provider. Document Released: 01/22/2005 Document Revised: 09/27/2015 Document Reviewed: 10/22/2014 Elsevier Interactive Patient Education  2019 [place].   Ankle Sprain, Phase I Rehab Ask your health care provider which exercises are safe for you. Do exercises exactly as told by your health care provider and adjust them as directed. It is normal to feel mild stretching, pulling, tightness, or discomfort as you do these exercises, but you should stop right away if you feel sudden pain or your pain gets worse.Do not begin these exercises until told by your health care provider. Stretching and range of motion exercises These exercises warm up your muscles and joints and improve the movement and flexibility of your lower leg and ankle. These exercises also help to relieve pain and stiffness. Exercise A: Gastroc and soleus stretch  1. Sit on the floor with your left / right leg extended. 2. Loop a belt or towel around the ball of your left / right foot. The ball of your foot is on the walking surface, right under your toes. 3. Keep your left / right ankle and foot relaxed and keep your knee straight while you use the belt or towel to pull your foot toward you. You should feel a gentle stretch behind your calf or knee. 4. Hold this position for __________ seconds, then release to the starting position. Repeat the exercise with your knee bent. You can put a pillow or a rolled bath towel under your knee to support it. You should feel a stretch deep in your calf or at your Achilles tendon. Repeat each stretch __________ times. Complete these stretches __________ times a day. Exercise B: Ankle alphabet  1. Sit with your left / right leg supported at the lower leg. ? Do not rest your foot on anything. ? Make sure your foot has room to move freely. 2. Think of your left / right foot as a paintbrush, and move your foot to  trace each letter of the alphabet in the air. Keep your hip and knee still while you trace. Make the letters as large as you can without feeling discomfort. 3. Trace every letter from A to Z. Repeat __________ times. Complete this exercise __________ times a day. Strengthening exercises These exercises build strength and endurance in your ankle and lower leg. Endurance is the ability to use your muscles for a long time, even after they get tired. Exercise C: Dorsiflexors  1. Secure a rubber exercise band or tube to an object, such as a table leg, that will stay still when the band is pulled. Secure the other end around your left / right foot. 2. Sit on the floor facing the object, with your left / right leg extended. The band or tube should be slightly tense when your foot is relaxed. 3. Slowly bring your foot toward you, pulling the band tighter. 4. Hold this position for __________ seconds. 5. Slowly return your foot to the starting position. Repeat __________ times. Complete this exercise __________ times a day. Exercise D: Plantar flexors  1. Sit on the floor with your left / right leg extended. 2. Loop a rubber exercise tube or band around the ball of your left / right foot. The ball of your foot is on the walking surface, right under your toes. ? Hold the ends of the band or tube in your hands. ? The band or tube should be slightly tense when your foot is relaxed. 3. Slowly point your foot and toes downward, pushing them away from you. 4. Hold this position for __________ seconds. 5. Slowly return your foot to the starting position. Repeat __________ times. Complete this exercise __________ times a day. Exercise E: Evertors 1. Sit on the floor with your legs straight out in front of you. 2. Loop a rubber exercise band or tube around the ball of your left / right foot. The ball of your foot is on the walking surface, right under your toes. ? Hold the ends of the band in your hands, or  secure the band to a stable object. ? The band or tube should be slightly tense when your foot is relaxed. 3. Slowly push your foot outward, away from your other leg. 4. Hold this position for __________ seconds. 5. Slowly return your foot to the starting position. Repeat __________ times. Complete this exercise __________ times a day. This information is not intended to replace advice given to you by your health care provider. Make sure you discuss any questions you have with your health care provider. Document Released: 08/23/2004 Document Revised: 07/09/2016 Document Reviewed: 12/06/2014 Elsevier Interactive Patient Education  2019 [employer].

## 2018-06-26 DIAGNOSIS — F4310 Post-traumatic stress disorder, unspecified: Secondary | ICD-10-CM | POA: Diagnosis not present

## 2018-06-26 DIAGNOSIS — F3341 Major depressive disorder, recurrent, in partial remission: Secondary | ICD-10-CM | POA: Diagnosis not present

## 2018-06-26 DIAGNOSIS — F329 Major depressive disorder, single episode, unspecified: Secondary | ICD-10-CM | POA: Diagnosis not present

## 2018-06-26 DIAGNOSIS — F900 Attention-deficit hyperactivity disorder, predominantly inattentive type: Secondary | ICD-10-CM | POA: Diagnosis not present

## 2018-07-03 DIAGNOSIS — F3341 Major depressive disorder, recurrent, in partial remission: Secondary | ICD-10-CM | POA: Diagnosis not present

## 2018-07-16 ENCOUNTER — Telehealth: Payer: BC Managed Care – PPO | Admitting: Family Medicine

## 2018-07-16 DIAGNOSIS — F3341 Major depressive disorder, recurrent, in partial remission: Secondary | ICD-10-CM | POA: Diagnosis not present
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
## 2018-07-17 ENCOUNTER — Telehealth (INDEPENDENT_AMBULATORY_CARE_PROVIDER_SITE_OTHER): Payer: BC Managed Care – PPO | Admitting: Family Medicine

## 2018-07-17 ENCOUNTER — Encounter: Payer: Self-pay | Admitting: Family Medicine

## 2018-07-17 VITALS — Temp 97.8°F | Wt 246.0 lb

## 2018-07-17 DIAGNOSIS — S39012A Strain of muscle, fascia and tendon of lower back, initial encounter: Secondary | ICD-10-CM

## 2018-07-17 NOTE — Patient Instructions (Signed)
Thank you for coming in today. You should hear about referral to physical therapy. Let me know if you do not hear anything soon in about a few days. Okay to try ketogenic diet. In general try to keep carbs less than 25-30 5 to 50 g of carbs per day Track total calories (macronutrients).  If you find that this is good for you then we want to check cholesterol after something like 6 weeks  Let me know how physical therapy goes if not improving or if worsening want to proceed with more advanced imaging including x-rays and likely MRI  Keep [place] updated.
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
## 2018-07-23 DIAGNOSIS — F3341 Major depressive disorder, recurrent, in partial remission: Secondary | ICD-10-CM | POA: Diagnosis not present

## 2018-07-24 DIAGNOSIS — M25561 Pain in right knee: Secondary | ICD-10-CM | POA: Diagnosis not present

## 2018-07-24 DIAGNOSIS — S39012D Strain of muscle, fascia and tendon of lower back, subsequent encounter: Secondary | ICD-10-CM | POA: Diagnosis not present

## 2018-07-30 DIAGNOSIS — F3341 Major depressive disorder, recurrent, in partial remission: Secondary | ICD-10-CM | POA: Diagnosis not present

## 2018-07-31 DIAGNOSIS — F900 Attention-deficit hyperactivity disorder, predominantly inattentive type: Secondary | ICD-10-CM | POA: Diagnosis not present

## 2018-07-31 DIAGNOSIS — F4310 Post-traumatic stress disorder, unspecified: Secondary | ICD-10-CM | POA: Diagnosis not present

## 2018-07-31 DIAGNOSIS — F329 Major depressive disorder, single episode, unspecified: Secondary | ICD-10-CM | POA: Diagnosis not present

## 2018-08-06 DIAGNOSIS — F332 Major depressive disorder, recurrent severe without psychotic features: Secondary | ICD-10-CM | POA: Diagnosis not present

## 2018-08-06 DIAGNOSIS — F3341 Major depressive disorder, recurrent, in partial remission: Secondary | ICD-10-CM | POA: Diagnosis not present

## 2018-08-12 DIAGNOSIS — F3341 Major depressive disorder, recurrent, in partial remission: Secondary | ICD-10-CM | POA: Diagnosis not present

## 2018-08-19 DIAGNOSIS — F3341 Major depressive disorder, recurrent, in partial remission: Secondary | ICD-10-CM | POA: Diagnosis not present

## 2018-08-22 DIAGNOSIS — F332 Major depressive disorder, recurrent severe without psychotic features: Secondary | ICD-10-CM | POA: Diagnosis not present

## 2018-08-25 DIAGNOSIS — F332 Major depressive disorder, recurrent severe without psychotic features: Secondary | ICD-10-CM | POA: Diagnosis not present

## 2018-08-26 DIAGNOSIS — F332 Major depressive disorder, recurrent severe without psychotic features: Secondary | ICD-10-CM | POA: Diagnosis not present

## 2018-08-26 DIAGNOSIS — F3341 Major depressive disorder, recurrent, in partial remission: Secondary | ICD-10-CM | POA: Diagnosis not present

## 2018-08-27 DIAGNOSIS — F332 Major depressive disorder, recurrent severe without psychotic features: Secondary | ICD-10-CM | POA: Diagnosis not present

## 2018-09-02 DIAGNOSIS — F3341 Major depressive disorder, recurrent, in partial remission: Secondary | ICD-10-CM | POA: Diagnosis not present

## 2018-09-04 ENCOUNTER — Telehealth: Payer: Self-pay

## 2018-09-04 DIAGNOSIS — Z1383 Encounter for screening for respiratory disorder NEC: Secondary | ICD-10-CM

## 2018-09-04 NOTE — Telephone Encounter (Signed)
Patient needing COVID test to get into residential mental health facility.  If OK, please send to testing pool so patient can be tested ASAP

## 2018-09-04 NOTE — Telephone Encounter (Signed)
I don't think the testing pool is a thing anymore, she can just drive up to the testing site at [place]. Otherwise, if no symptoms, can come here for self-swab (which might give faster results). Orders in for drive through. If she comes here, whoever's schedule she's on can put in the order.

## 2018-09-05 ENCOUNTER — Other Ambulatory Visit: Payer: Self-pay

## 2018-09-05 ENCOUNTER — Ambulatory Visit (INDEPENDENT_AMBULATORY_CARE_PROVIDER_SITE_OTHER): Payer: BC Managed Care – PPO | Admitting: Physician Assistant

## 2018-09-05 ENCOUNTER — Encounter: Payer: Self-pay | Admitting: Physician Assistant

## 2018-09-05 VITALS — BP 120/67 | HR 92 | Temp 99.0°F | Ht 63.0 in | Wt 250.0 lb

## 2018-09-05 DIAGNOSIS — Z1159 Encounter for screening for other viral diseases: Secondary | ICD-10-CM

## 2018-09-05 DIAGNOSIS — F3132 Bipolar disorder, current episode depressed, moderate: Secondary | ICD-10-CM | POA: Diagnosis not present

## 2018-09-05 DIAGNOSIS — F603 Borderline personality disorder: Secondary | ICD-10-CM

## 2018-09-05 DIAGNOSIS — F332 Major depressive disorder, recurrent severe without psychotic features: Secondary | ICD-10-CM
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
## 2018-09-05 NOTE — Telephone Encounter (Signed)
Called pt to advise but no answer and no VM set up

## 2018-09-05 NOTE — Telephone Encounter (Signed)
Scheduled for self swab in office this PM

## 2018-09-07 LAB — NOVEL CORONAVIRUS, NAA: SARS-CoV-2, NAA: NOT DETECTED

## 2018-09-08 ENCOUNTER — Encounter: Payer: Self-pay | Admitting: Physician Assistant

## 2018-09-09 ENCOUNTER — Ambulatory Visit: Payer: BC Managed Care – PPO | Admitting: Osteopathic Medicine

## 2018-09-09 DIAGNOSIS — F3341 Major depressive disorder, recurrent, in partial remission: Secondary | ICD-10-CM | POA: Diagnosis not present

## 2018-09-10 DIAGNOSIS — F900 Attention-deficit hyperactivity disorder, predominantly inattentive type: Secondary | ICD-10-CM | POA: Diagnosis not present

## 2018-09-10 DIAGNOSIS — F4310 Post-traumatic stress disorder, unspecified: Secondary | ICD-10-CM | POA: Diagnosis not present

## 2018-09-10 DIAGNOSIS — F332 Major depressive disorder, recurrent severe without psychotic features: Secondary | ICD-10-CM | POA: Diagnosis not present

## 2018-09-11 DIAGNOSIS — F332 Major depressive disorder, recurrent severe without psychotic features: Secondary | ICD-10-CM | POA: Diagnosis not present

## 2018-09-12 DIAGNOSIS — F332 Major depressive disorder, recurrent severe without psychotic features: Secondary | ICD-10-CM | POA: Diagnosis not present

## 2018-09-17 DIAGNOSIS — F4310 Post-traumatic stress disorder, unspecified: Secondary | ICD-10-CM | POA: Diagnosis not present

## 2018-09-17 DIAGNOSIS — F332 Major depressive disorder, recurrent severe without psychotic features: Secondary | ICD-10-CM | POA: Diagnosis not present

## 2018-09-17 DIAGNOSIS — F900 Attention-deficit hyperactivity disorder, predominantly inattentive type: Secondary | ICD-10-CM | POA: Diagnosis not present

## 2018-09-17 DIAGNOSIS — Z713 Dietary counseling and surveillance: Secondary | ICD-10-CM | POA: Diagnosis not present

## 2018-09-22 DIAGNOSIS — Z0000 Encounter for general adult medical examination without abnormal findings: Secondary | ICD-10-CM | POA: Diagnosis not present

## 2018-09-22 DIAGNOSIS — Z87898 Personal history of other specified conditions: Secondary | ICD-10-CM | POA: Diagnosis not present

## 2018-09-22 DIAGNOSIS — E559 Vitamin D deficiency, unspecified: Secondary | ICD-10-CM | POA: Diagnosis not present

## 2018-09-22 DIAGNOSIS — F332 Major depressive disorder, recurrent severe without psychotic features: Secondary | ICD-10-CM | POA: Diagnosis not present

## 2018-09-24 DIAGNOSIS — F4310 Post-traumatic stress disorder, unspecified: Secondary | ICD-10-CM | POA: Diagnosis not present

## 2018-09-24 DIAGNOSIS — F332 Major depressive disorder, recurrent severe without psychotic features: Secondary | ICD-10-CM | POA: Diagnosis not present

## 2018-09-25 DIAGNOSIS — Z713 Dietary counseling and surveillance: Secondary | ICD-10-CM | POA: Diagnosis not present

## 2018-09-29 DIAGNOSIS — F332 Major depressive disorder, recurrent severe without psychotic features: Secondary | ICD-10-CM | POA: Diagnosis not present

## 2018-09-29 DIAGNOSIS — F333 Major depressive disorder, recurrent, severe with psychotic symptoms: Secondary | ICD-10-CM | POA: Diagnosis not present

## 2018-10-01 DIAGNOSIS — F332 Major depressive disorder, recurrent severe without psychotic features: Secondary | ICD-10-CM | POA: Diagnosis not present

## 2018-10-01 DIAGNOSIS — F4310 Post-traumatic stress disorder, unspecified: Secondary | ICD-10-CM | POA: Diagnosis not present

## 2018-10-01 DIAGNOSIS — Z713 Dietary counseling and surveillance: Secondary | ICD-10-CM | POA: Diagnosis not present

## 2018-10-09 DIAGNOSIS — F4310 Post-traumatic stress disorder, unspecified: Secondary | ICD-10-CM | POA: Diagnosis not present

## 2018-10-09 DIAGNOSIS — F332 Major depressive disorder, recurrent severe without psychotic features: Secondary | ICD-10-CM | POA: Diagnosis not present

## 2018-10-09 DIAGNOSIS — F603 Borderline personality disorder: Secondary | ICD-10-CM | POA: Diagnosis not present

## 2018-10-10 DIAGNOSIS — F332 Major depressive disorder, recurrent severe without psychotic features: Secondary | ICD-10-CM | POA: Diagnosis not present

## 2018-10-10 DIAGNOSIS — F4310 Post-traumatic stress disorder, unspecified: Secondary | ICD-10-CM | POA: Diagnosis not present

## 2018-10-15 DIAGNOSIS — F4310 Post-traumatic stress disorder, unspecified: Secondary | ICD-10-CM | POA: Diagnosis not present

## 2018-10-15 DIAGNOSIS — F332 Major depressive disorder, recurrent severe without psychotic features: Secondary | ICD-10-CM | POA: Diagnosis not present

## 2018-10-17 DIAGNOSIS — F4310 Post-traumatic stress disorder, unspecified: Secondary | ICD-10-CM | POA: Diagnosis not present

## 2018-10-17 DIAGNOSIS — F332 Major depressive disorder, recurrent severe without psychotic features: Secondary | ICD-10-CM | POA: Diagnosis not present

## 2018-10-19 DIAGNOSIS — Z008 Encounter for other general examination: Secondary | ICD-10-CM | POA: Diagnosis not present

## 2018-10-19 DIAGNOSIS — F419 Anxiety disorder, unspecified: Secondary | ICD-10-CM | POA: Diagnosis not present

## 2018-10-19 DIAGNOSIS — F339 Major depressive disorder, recurrent, unspecified: Secondary | ICD-10-CM | POA: Diagnosis not present

## 2018-10-19 DIAGNOSIS — F329 Major depressive disorder, single episode, unspecified: Secondary | ICD-10-CM | POA: Diagnosis not present

## 2018-10-19 DIAGNOSIS — F603 Borderline personality disorder: Secondary | ICD-10-CM | POA: Diagnosis not present

## 2018-10-20 DIAGNOSIS — Z79899 Other long term (current) drug therapy: Secondary | ICD-10-CM | POA: Diagnosis not present

## 2018-10-20 DIAGNOSIS — F332 Major depressive disorder, recurrent severe without psychotic features: Secondary | ICD-10-CM | POA: Diagnosis not present

## 2018-10-20 DIAGNOSIS — Z23 Encounter for immunization: Secondary | ICD-10-CM | POA: Diagnosis not present

## 2018-10-20 DIAGNOSIS — Z560 Unemployment, unspecified: Secondary | ICD-10-CM | POA: Diagnosis not present

## 2018-10-20 DIAGNOSIS — Z915 Personal history of self-harm: Secondary | ICD-10-CM | POA: Diagnosis not present

## 2018-10-20 DIAGNOSIS — Z630 Problems in relationship with spouse or partner: Secondary | ICD-10-CM | POA: Diagnosis not present

## 2018-10-20 DIAGNOSIS — R45851 Suicidal ideations: Secondary | ICD-10-CM | POA: Diagnosis not present

## 2018-10-20 DIAGNOSIS — F419 Anxiety disorder, unspecified: Secondary | ICD-10-CM | POA: Diagnosis not present

## 2018-10-20 DIAGNOSIS — F329 Major depressive disorder, single episode, unspecified: Secondary | ICD-10-CM | POA: Diagnosis not present

## 2018-10-20 DIAGNOSIS — Z20828 Contact with and (suspected) exposure to other viral communicable diseases: Secondary | ICD-10-CM | POA: Diagnosis not present

## 2018-10-20 DIAGNOSIS — Z658 Other specified problems related to psychosocial circumstances: Secondary | ICD-10-CM | POA: Diagnosis not present

## 2018-10-20 DIAGNOSIS — Z598 Other problems related to housing and economic circumstances: Secondary | ICD-10-CM | POA: Diagnosis not present

## 2018-10-20 DIAGNOSIS — F603 Borderline personality disorder: Secondary | ICD-10-CM | POA: Diagnosis not present

## 2018-10-20 DIAGNOSIS — G4700 Insomnia, unspecified: Secondary | ICD-10-CM | POA: Diagnosis not present

## 2018-10-29 DIAGNOSIS — F3341 Major depressive disorder, recurrent, in partial remission: Secondary | ICD-10-CM | POA: Diagnosis not present

## 2018-10-29 DIAGNOSIS — F332 Major depressive disorder, recurrent severe without psychotic features: Secondary | ICD-10-CM | POA: Diagnosis not present

## 2018-10-30 DIAGNOSIS — F332 Major depressive disorder, recurrent severe without psychotic features: Secondary | ICD-10-CM | POA: Diagnosis not present

## 2018-10-30 DIAGNOSIS — F3341 Major depressive disorder, recurrent, in partial remission: Secondary | ICD-10-CM | POA: Diagnosis not present

## 2018-10-31 DIAGNOSIS — F332 Major depressive disorder, recurrent severe without psychotic features: Secondary | ICD-10-CM | POA: Diagnosis not present

## 2018-11-03 DIAGNOSIS — F332 Major depressive disorder, recurrent severe without psychotic features: Secondary | ICD-10-CM | POA: Diagnosis not present

## 2018-11-04 DIAGNOSIS — F332 Major depressive disorder, recurrent severe without psychotic features: Secondary | ICD-10-CM | POA: Diagnosis not present

## 2018-11-05 ENCOUNTER — Telehealth: Payer: Self-pay | Admitting: Osteopathic Medicine

## 2018-11-05 ENCOUNTER — Ambulatory Visit (INDEPENDENT_AMBULATORY_CARE_PROVIDER_SITE_OTHER): Payer: BC Managed Care – PPO | Admitting: Family Medicine

## 2018-11-05 DIAGNOSIS — F603 Borderline personality disorder: Secondary | ICD-10-CM

## 2018-11-05 DIAGNOSIS — Z3A01 Less than 8 weeks gestation of pregnancy: Secondary | ICD-10-CM | POA: Diagnosis not present

## 2018-11-05 DIAGNOSIS — N3000 Acute cystitis without hematuria: Secondary | ICD-10-CM

## 2018-11-05 DIAGNOSIS — F332 Major depressive disorder, recurrent severe without psychotic features: Secondary | ICD-10-CM

## 2018-11-05 MED ORDER — CEPHALEXIN 500 MG PO CAPS: 500.0000 mg | ORAL_CAPSULE | Freq: Two times a day (BID) | ORAL | 0 refills | Status: DC

## 2018-11-05 NOTE — Telephone Encounter (Signed)
Ok thanks 

## 2018-11-05 NOTE — Progress Notes (Signed)
Virtual Visit  via Video Note  I connected with      Tasha Barrett by a video enabled telemedicine application and verified that I am speaking with the correct person using two identifiers.   I discussed the limitations of evaluation and management by telemedicine and the availability of in person appointments. The patient expressed understanding and agreed to proceed.  History of Present Illness: Tasha Barrett is a 28 y.o. female who would like to discuss mood, and pregnancy.  [place] the long history of mood disorder.  She has had several diagnoses including PTSD anxiety depression and borderline personality disorder.  She has been tried on multiple different medications previously.  She recently was participated in intensive outpatient therapy and was recently hospitalized for depressive symptoms with self-harm behavior.  She has been released from the hospital and intensive outpatient therapy.  She found out that she is pregnant and would like to discontinue her bupropion and fluoxetine.  These medications were started somewhat recently and she notes that they have not been helpful at all.  She sees a therapist regularly for counseling and is in the process of reestablishing care with her psychiatrist.  Overall she feels reasonably well.  She denies any active suicidal or homicidal ideation.  She currently is receiving [place] treatment for her depression which she does find somewhat helpful and is in the process of falling back with her therapist to start dialectical behavioral therapy.   Pregnancy: This is her second pregnancy.  She has a 63-year-old child at home.  She has not started taking prenatal vitamins yet.  She previously was under the care of Dr. Hulan Fray OB/GYN here in [place] but notes that she is living near [place] now and is thinking about establishing care with OB/GYN closer to where she lives.  Lastly she also thinks that she has a urinary tract infection.  She notes a  several day history of urinary frequency urgency and dysuria.  Her symptoms are consistent with previous episodes of UTI.      Observations/Objective: Exam: Appearance Normal Speech.  Psych: Alert and oriented normal speech thought process and affect.  Thought processes linear and goal-directed and organized.  She denies any suicidal homicidal ideation.    Assessment and Plan: 28 y.o. female with  Mood disorder and personality disorder: She is currently seeking some treatment with [facility] and therapy.  Prozac and Wellbutrin she finds have not been as helpful as she would like and she is worried about potential side effects with pregnancy and is planning to discontinue them.  I think that is not ideal but not unreasonable.  Plan to stop Wellbutrin now and stop Prozac in a few days.  Continue receiving regular therapy and [place].  Follow back up with psychiatry.  Additionally recommend scheduling with PCP in about a month and checking back sooner if needed.  Pregnancy: G2, P1.  Plan to start prenatal vitamins and reestablish care with OB/GYN.  Reasonable to establish care with OB/GYN closer to where she lives.  UTI: Probable.  Reasonable to treat empirically.  Start Keflex 7-day course.  Recheck as needed.  PDMP not reviewed this encounter. No orders of the defined types were placed in this encounter.  Meds ordered this encounter  Medications  . cephALEXin (KEFLEX) 500 MG capsule    Sig: Take 1 capsule (500 mg total) by mouth 2 (two) times daily.    Dispense:  14 capsule    Refill:  0  Follow Up Instructions:    I discussed the assessment and treatment plan with the patient. The patient was provided an opportunity to ask questions and all were answered. The patient agreed with the plan and demonstrated an understanding of the instructions.   The patient was advised to call back or seek an in-person evaluation if the symptoms worsen or if the condition fails to improve as  anticipated.  Time: 25 minutes of intraservice time, with >39 minutes of total time during today's visit.      Historical information moved to improve visibility of documentation.  Past Medical History:  Diagnosis Date  . Anxiety    Used Latuda (did not work)  . Bipolar 2 disorder ([place])   . Bipolar disorder ([place])   . Depression   . Pneumonia    age 5   . Yeast infection    Past Surgical History:  Procedure Laterality Date  . BREAST REDUCTION SURGERY  01/2015  . CESAREAN SECTION N/A 03/09/2014   Procedure: CESAREAN SECTION;  Surgeon: Donnamae Jude, MD;  Location: [facility];  Service: Obstetrics;  Laterality: N/A;   Social History   Tobacco Use  . Smoking status: Never Smoker  . Smokeless tobacco: Never Used  Substance Use Topics  . Alcohol use: Yes    Alcohol/week: 1.0 standard drinks    Types: 1 Glasses of wine per week    Comment: occ   family history includes Depression in her mother; Gout in her father, paternal grandfather, and paternal uncle; Hypertension in her father.  Medications: Current Outpatient Medications  Medication Sig Dispense Refill  . cephALEXin (KEFLEX) 500 MG capsule Take 1 capsule (500 mg total) by mouth 2 (two) times daily. 14 capsule 0   No current facility-administered medications for this visit.    Allergies  Allergen Reactions  . Amoxicillin-Pot Clavulanate Other (See Comments)    As child - pt reports NO Hx anaphylaxis, thinks was hives  . Latex Other (See Comments)    Possible yeast infection.

## 2018-11-05 NOTE — Telephone Encounter (Addendum)
Patient is coming to see Iran Planas, PA-C tomorrow. Declined to come in today for UTI, I did offer appointment. States that she wanted to also discuss getting off depression medication that it is not working. Declined needing to speak to triage. States that the medication is the trazodone, wellbutrin, and prozac. Patient states that she tried to contact her Randa Spike MD, her Psychiatrist as well but shes really not sure if this is even her Psychiatrist anymore. Just FYI.

## 2018-11-05 NOTE — Telephone Encounter (Signed)
Pt scheduled to talk with Dr Georgina Snell today

## 2018-11-06 ENCOUNTER — Ambulatory Visit: Payer: BC Managed Care – PPO | Admitting: Physician Assistant

## 2018-11-06 DIAGNOSIS — F332 Major depressive disorder, recurrent severe without psychotic features: Secondary | ICD-10-CM | POA: Diagnosis not present

## 2018-11-06 DIAGNOSIS — Z3490 Encounter for supervision of normal pregnancy, unspecified, unspecified trimester: Secondary | ICD-10-CM | POA: Insufficient documentation

## 2018-11-07 DIAGNOSIS — F332 Major depressive disorder, recurrent severe without psychotic features: Secondary | ICD-10-CM | POA: Diagnosis not present

## 2018-11-10 DIAGNOSIS — F332 Major depressive disorder, recurrent severe without psychotic features: Secondary | ICD-10-CM | POA: Diagnosis not present

## 2018-11-11 DIAGNOSIS — F332 Major depressive disorder, recurrent severe without psychotic features: Secondary | ICD-10-CM | POA: Diagnosis not present

## 2018-11-12 ENCOUNTER — Ambulatory Visit (INDEPENDENT_AMBULATORY_CARE_PROVIDER_SITE_OTHER): Payer: BC Managed Care – PPO | Admitting: Family Medicine

## 2018-11-12 ENCOUNTER — Other Ambulatory Visit: Payer: Self-pay

## 2018-11-12 ENCOUNTER — Encounter: Payer: Self-pay | Admitting: Family Medicine

## 2018-11-12 VITALS — BP 138/80 | HR 80 | Wt 246.0 lb

## 2018-11-12 DIAGNOSIS — F4310 Post-traumatic stress disorder, unspecified: Secondary | ICD-10-CM

## 2018-11-12 DIAGNOSIS — M545 Low back pain, unspecified: Secondary | ICD-10-CM

## 2018-11-12 DIAGNOSIS — R350 Frequency of micturition: Secondary | ICD-10-CM | POA: Diagnosis not present

## 2018-11-12 DIAGNOSIS — F909 Attention-deficit hyperactivity disorder, unspecified type: Secondary | ICD-10-CM | POA: Insufficient documentation

## 2018-11-12 DIAGNOSIS — F332 Major depressive disorder, recurrent severe without psychotic features: Secondary | ICD-10-CM | POA: Diagnosis not present

## 2018-11-12 LAB — POCT URINALYSIS DIPSTICK
Bilirubin, UA: NEGATIVE
Blood, UA: NEGATIVE
Glucose, UA: NEGATIVE
Leukocytes, UA: NEGATIVE
Nitrite, UA: NEGATIVE
Protein, UA: NEGATIVE
Spec Grav, UA: >=1.03 — AB (ref 1.010–1.025)
Urobilinogen, UA: 0.2 E.U./dL
pH, UA: 6 (ref 5.0–8.0)

## 2018-11-12 MED ORDER — CEFDINIR 300 MG PO CAPS: 300.0000 mg | ORAL_CAPSULE | Freq: Two times a day (BID) | ORAL | 0 refills | Status: DC

## 2018-11-12 NOTE — Patient Instructions (Signed)
Thank you for coming in today. Please move the appointment with Dr Sheppard Coil forward into later next week or early the week after to follow up pelvic discomfort and mood.   If you pain or discomfort worsened after you finish keflex fill and start omnicef antibiotic  We are here for you.

## 2018-11-12 NOTE — Progress Notes (Signed)
Tasha Barrett is a 28 y.o. female who presents to [facility] [place] today for discuss back pain, pregnancy, and mood.  Patient notes a several day history of soreness in her low back and pelvis.  Symptoms are worse with sleep.  She notes urinary urgency and frequency.  She denies any burning with urination or change in urine color.  She notes symptoms do not change with bowel movement.  She is had a normal vomitus.  She notes decreased appetite.  Patient was treated about a week ago empirically for UTI and is still finishing up her Keflex prescription.   She recently found out she was pregnant.  She notes her last menstrual period was early August and she had a positive pregnancy test in mid September.  She has been going back and forth about what she is going to do and has [facility] for chemical [facility].  She notes this is obviously stressful and challenging for her.  Additionally she is having significant mood issues.  She has a long history of difficult to diagnose individual controlled mood disorder.  She currently is receiving transcutaneous magnetic stimulation and will be starting dialectical behavioral therapy for depression anxiety and borderline personality disorder.  She notes that she is currently in the process of going through divorce as result of her long history of not well controlled mood and finds everything very stressful and challenging.  She feels as safe and denies any active suicidal thoughts.    ROS as above:  Exam:  BP 138/80   Pulse 80   Wt 246 lb (111.6 kg)   BMI 43.58 kg/m  Wt Readings from Last 5 Encounters:  11/12/18 246 lb (111.6 kg)  09/05/18 250 lb (113.4 kg)  07/17/18 246 lb (111.6 kg)  06/25/18 246 lb (111.6 kg)  04/23/18 233 lb 6.4 oz (105.9 kg)    Gen: Well NAD HEENT: EOMI,   MMM Lungs: Normal work of breathing. CTABL Heart: RRR no MRG Abd: NABS, Soft. Nondistended, Nontender no CVA tenderness to percussion Exts: Brisk capillary refill, warm and well perfused.  L-spine: Nontender to spinal midline.  Nontender paraspinal musculature.  Normal lumbar motion.  Lower extremity strength is intact. Lab and Radiology Results Results for orders placed or performed in visit on 11/12/18 (from the past 72 hour(s))  POCT urinalysis dipstick     Status: Abnormal   Collection Time: 11/12/18  3:02 PM  Result Value Ref Range   Color, UA Dark Yellow    Clarity, UA Cloudy    Glucose, UA Negative Negative   Bilirubin, UA Negative    Ketones, UA Trace    Spec Grav, UA >=1.030 (A) 1.010 - 1.025   Blood, UA Negative    pH, UA 6.0 5.0 - 8.0   Protein, UA Negative Negative   Urobilinogen, UA 0.2 0.2 or 1.0 E.U./dL   Nitrite, UA Negative    Leukocytes, UA Negative Negative   Appearance Cloudy    Odor No odor    No results found.    Assessment and Plan: 28 y.o. female with abdominal and low back symptoms.  Etiology is unclear.  Clinically she seems to do pretty well.  Is possible that she is partially treated UTI will go ahead and empirically add Omnicef to take if symptoms worsen after Keflex finishes.  Patient has scheduled follow-up with PCP in near future.  Mood: Obviously challenging.  Plan to continue therapy and [place].  Follow-up with PCP as scheduled later this month.  Precautions reviewed.  Pregnancy: Obviously a challenging situation.  If patient wishes to continue with her pregnancy we should proceed with further work-up including possibly pelvic ultrasound.  PDMP not reviewed this encounter. Orders Placed This Encounter  Procedures  . POCT urinalysis dipstick   Meds ordered this encounter  Medications  . cefdinir (OMNICEF) 300 MG capsule    Sig: Take 1 capsule (300 mg total) by mouth 2 (two) times daily.    Dispense:  14 capsule    Refill:  0      Historical information moved to improve visibility of documentation.  Past Medical History:  Diagnosis Date  . Anxiety    Used Latuda (did not work)  . Bipolar 2 disorder ([place])   . Bipolar disorder ([place])   . Depression   . Pneumonia    age 54   . Yeast infection    Past Surgical History:  Procedure Laterality Date  . BREAST REDUCTION SURGERY  01/2015  . CESAREAN SECTION N/A 03/09/2014   Procedure: CESAREAN SECTION;  Surgeon: Donnamae Jude, MD;  Location: [facility];  Service: Obstetrics;  Laterality: N/A;   Social History   Tobacco Use  . Smoking status: Never Smoker  . Smokeless tobacco: Never Used  Substance Use Topics  . Alcohol use: Yes    Alcohol/week: 1.0 standard drinks    Types: 1 Glasses of wine per week    Comment: occ   family history includes Depression in her mother; Gout in her father, paternal grandfather, and paternal uncle; Hypertension in her father.  Medications: Current Outpatient Medications  Medication Sig Dispense Refill  . cefdinir (OMNICEF) 300 MG capsule Take 1 capsule (300 mg total) by mouth 2 (two) times daily. 14 capsule 0  . Melatonin 10 MG CAPS Take by mouth.     No current facility-administered medications for this visit.    Allergies  Allergen Reactions  . Amoxicillin-Pot Clavulanate Other (See Comments)    As child - pt reports NO Hx anaphylaxis, thinks was hives  . Latex Other (See Comments)    Possible yeast infection.     Discussed warning signs or symptoms. Please see discharge instructions. Patient expresses understanding.

## 2018-11-13 DIAGNOSIS — F332 Major depressive disorder, recurrent severe without psychotic features: Secondary | ICD-10-CM | POA: Diagnosis not present

## 2018-11-13 DIAGNOSIS — F3341 Major depressive disorder, recurrent, in partial remission: Secondary | ICD-10-CM | POA: Diagnosis not present

## 2018-11-14 DIAGNOSIS — F332 Major depressive disorder, recurrent severe without psychotic features: Secondary | ICD-10-CM | POA: Diagnosis not present

## 2018-11-19 DIAGNOSIS — F3341 Major depressive disorder, recurrent, in partial remission: Secondary | ICD-10-CM | POA: Diagnosis not present

## 2018-11-27 DIAGNOSIS — F3341 Major depressive disorder, recurrent, in partial remission: Secondary | ICD-10-CM | POA: Diagnosis not present

## 2018-12-03 DIAGNOSIS — F603 Borderline personality disorder: Secondary | ICD-10-CM | POA: Diagnosis not present

## 2018-12-03 DIAGNOSIS — F4310 Post-traumatic stress disorder, unspecified: Secondary | ICD-10-CM | POA: Diagnosis not present

## 2018-12-05 ENCOUNTER — Ambulatory Visit: Payer: BC Managed Care – PPO | Admitting: Osteopathic Medicine

## 2018-12-24 ENCOUNTER — Telehealth: Payer: BC Managed Care – PPO | Admitting: Nurse Practitioner

## 2018-12-24 DIAGNOSIS — M545 Low back pain, unspecified: Secondary | ICD-10-CM

## 2018-12-24 DIAGNOSIS — R300 Dysuria: Secondary | ICD-10-CM

## 2018-12-24 NOTE — Progress Notes (Signed)
Based on what you shared with me it looks like you have a urinary tract infectiion with associating back pain,that should be evaluated in a face to face office visit. You need a urinalysis and a  urine culture in order to be treated properly.    NOTE: If you entered your credit card information for this eVisit, you will not be charged. You may see a "hold" on your card for the $35 but that hold will drop off and you will not have a charge processed.  If you are having a true medical emergency please call 911.     For an urgent face to face visit, [facility] has four urgent care centers for your convenience:   . [facility]    [phone]                  Get Driving Directions  [id number] [place], [place] [address] . 10 am to 8 pm Monday-Friday . 12 pm to 8 pm Saturday-Sunday   . [facility] at [place]                  Get Driving Directions  [id number] [place], [place], [place] [address] . 8 am to 8 pm Monday-Friday . 9 am to 6 pm Saturday . 11 am to 6 pm Sunday   . [facility] at [place]                  Get Driving Directions   [address] [place], [place] [address] . 8 am to 8 pm Monday-Friday . 8 am to 4 pm Saturday-Sunday    . [facility] at [place]                    Get Driving Directions  [id number]  [address] [place], [place] [address]  . Monday-Friday, 12 PM to 6 PM    Your e-visit answers were reviewed by a board certified advanced clinical practitioner to complete your personal care plan.  Thank you for using e-Visits.

## 2018-12-25 ENCOUNTER — Encounter: Payer: Self-pay | Admitting: Family Medicine

## 2018-12-25 ENCOUNTER — Encounter: Payer: BC Managed Care – PPO | Admitting: Family Medicine

## 2018-12-25 ENCOUNTER — Other Ambulatory Visit: Payer: Self-pay

## 2018-12-25 ENCOUNTER — Ambulatory Visit (INDEPENDENT_AMBULATORY_CARE_PROVIDER_SITE_OTHER): Payer: BC Managed Care – PPO | Admitting: Family Medicine

## 2018-12-25 VITALS — BP 119/59 | HR 75 | Ht 63.0 in | Wt 248.2 lb

## 2018-12-25 DIAGNOSIS — N390 Urinary tract infection, site not specified: Secondary | ICD-10-CM | POA: Diagnosis not present

## 2018-12-25 DIAGNOSIS — R300 Dysuria: Secondary | ICD-10-CM | POA: Diagnosis not present

## 2018-12-25 LAB — POCT URINALYSIS DIP (CLINITEK)
Bilirubin, UA: NEGATIVE
Blood, UA: NEGATIVE
Glucose, UA: NEGATIVE mg/dL
Ketones, POC UA: NEGATIVE mg/dL
Leukocytes, UA: NEGATIVE
Nitrite, UA: NEGATIVE
POC PROTEIN,UA: NEGATIVE
Spec Grav, UA: >=1.03 — AB (ref 1.010–1.025)
Urobilinogen, UA: 0.2 E.U./dL
pH, UA: 5.5 (ref 5.0–8.0)

## 2018-12-25 MED ORDER — SULFAMETHOXAZOLE-TRIMETHOPRIM 800-160 MG PO TABS: 1.0000 | ORAL_TABLET | Freq: Two times a day (BID) | ORAL | 0 refills | Status: DC

## 2018-12-25 NOTE — Progress Notes (Signed)
Acute Office Visit  Subjective:    Patient ID: Tasha Barrett, female    DOB: [birth year], 28 y.o.   MRN: [id number]  Chief Complaint  Patient presents with  . Urinary Tract Infection    HPI Patient is in today for possible UTI.  She says in the last couple of months this is her third UTI.  She is concerned for why she keeps getting them but she said she did have a difficult time with yeast infections in the past where she kept getting it for almost 2 years before it finally resolved.  She says she noticed the first UTI after starting to have waxing done.  More recently she was experiencing some low back pain and then it developed into pelvic pressure and urinary urgency though she is not seeing any blood and she is not had any pain with urination.  She had a round of antibiotics on September 17 for With Keflex.  Then she was treated October 7 with Omnicef.  She has not had 8 urine culture done.  No fevers chills or sweats.  She says she tries to drink a lot of fluid but her urine always looks a little dark.  Once in a while it will actually look lighter.  She says that she thinks this is either her fourth or fifth UTI this year.  She denies any vaginal symptoms. She actually lives in [place] now as she moved after she and her husband split.  She was traveling back to [place] to pick up her daughter.  Past Medical History:  Diagnosis Date  . Anxiety    Used Latuda (did not work)  . Bipolar 2 disorder ([place])   . Bipolar disorder ([place])   . Depression   . Pneumonia    age 4   . Yeast infection     Past Surgical History:  Procedure Laterality Date  . BREAST REDUCTION SURGERY  01/2015  . CESAREAN SECTION N/A 03/09/2014   Procedure: CESAREAN SECTION;  Surgeon: Donnamae Jude, MD;  Location: [facility];  Service: Obstetrics;  Laterality: N/A;    Family History  Problem Relation Age of Onset  . Depression Mother   . Gout Father   . Hypertension Father   . Gout Paternal Uncle   .  Gout Paternal Grandfather   . Anesthesia problems Neg Hx   . Hypotension Neg Hx   . Malignant hyperthermia Neg Hx   . Pseudochol deficiency Neg Hx     Social History   Socioeconomic History  . Marital status: Married    Spouse name: Not on file  . Number of children: Not on file  . Years of education: Not on file  . Highest education level: Not on file  Occupational History  . Occupation: employed  Scientific laboratory technician  . Financial resource strain: Not on file  . Food insecurity    Worry: Not on file    Inability: Not on file  . Transportation needs    Medical: Not on file    Non-medical: Not on file  Tobacco Use  . Smoking status: Never Smoker  . Smokeless tobacco: Never Used  Substance and Sexual Activity  . Alcohol use: Yes    Alcohol/week: 1.0 standard drinks    Types: 1 Glasses of wine per week    Comment: occ  . Drug use: Yes    Types: Marijuana    Comment: jan 2013 X 1 only  . Sexual activity: Yes  Partners: Male    Birth control/protection: Other-see comments    Comment: Diaphragm  Lifestyle  . Physical activity    Days per week: Not on file    Minutes per session: Not on file  . Stress: Not on file  Relationships  . Social Herbalist on phone: Not on file    Gets together: Not on file    Attends religious service: Not on file    Active member of club or organization: Not on file    Attends meetings of clubs or organizations: Not on file    Relationship status: Not on file  . Intimate partner violence    Fear of current or ex partner: Not on file    Emotionally abused: Not on file    Physically abused: Not on file    Forced sexual activity: Not on file  Other Topics Concern  . Not on file  Social History Narrative  . Not on file    Outpatient Medications Prior to Visit  Medication Sig Dispense Refill  . Melatonin 10 MG CAPS Take by mouth.    . cefdinir (OMNICEF) 300 MG capsule Take 1 capsule (300 mg total) by mouth 2 (two) times daily. 14  capsule 0   No facility-administered medications prior to visit.     Allergies  Allergen Reactions  . Amoxicillin-Pot Clavulanate Other (See Comments)    As child - pt reports NO Hx anaphylaxis, thinks was hives  . Latex Other (See Comments)    Possible yeast infection.    ROS     Objective:    Physical Exam  Constitutional: She is oriented to person, place, and time. She appears well-developed and well-nourished.  HENT:  Head: Normocephalic and atraumatic.  Cardiovascular: Normal rate, regular rhythm and normal heart sounds.  Pulmonary/Chest: Effort normal and breath sounds normal.  Musculoskeletal:     Comments: No CVA tenderness.  Neurological: She is alert and oriented to person, place, and time.  Skin: Skin is warm and dry.  Psychiatric: She has a normal mood and affect. Her behavior is normal.    BP (!) 119/59   Pulse 75   Ht 5\' 3"  (1.6 m)   Wt 248 lb 3.2 oz (112.6 kg)   SpO2 100%   BMI 43.97 kg/m  Wt Readings from Last 3 Encounters:  12/25/18 248 lb 3.2 oz (112.6 kg)  11/12/18 246 lb (111.6 kg)  09/05/18 250 lb (113.4 kg)    There are no preventive care reminders to display for this patient.  There are no preventive care reminders to display for this patient.   Lab Results  Component Value Date   TSH 1.43 10/22/2017   Lab Results  Component Value Date   WBC 6.4 10/22/2017   HGB 10.7 (L) 10/22/2017   HCT 33.9 (L) 10/22/2017   MCV 77.8 (L) 10/22/2017   PLT 302 10/22/2017   Lab Results  Component Value Date   NA 136 10/22/2017   K 4.7 10/22/2017   CO2 28 10/22/2017   GLUCOSE 88 10/22/2017   BUN 12 10/22/2017   CREATININE 0.83 10/22/2017   BILITOT 0.4 10/22/2017   ALKPHOS 75 05/11/2015   AST 14 10/22/2017   ALT 10 10/22/2017   PROT 7.7 10/22/2017   ALBUMIN 3.7 05/11/2015   CALCIUM 9.5 10/22/2017   ANIONGAP 11 05/11/2015   Lab Results  Component Value Date   CHOL 143 05/13/2015   Lab Results  Component Value Date   HDL 40 (  L)  05/13/2015   Lab Results  Component Value Date   LDLCALC 87 05/13/2015   Lab Results  Component Value Date   TRIG 79 05/13/2015   Lab Results  Component Value Date   CHOLHDL 3.6 05/13/2015   Lab Results  Component Value Date   HGBA1C 5.4 10/22/2017       Assessment & Plan:   Problem List Items Addressed This Visit    None    Visit Diagnoses    Dysuria    -  Primary   Relevant Orders   POCT URINALYSIS DIP (CLINITEK) (Completed)   Urine Culture   Recurrent UTI       Relevant Medications   sulfamethoxazole-trimethoprim (BACTRIM DS) 800-160 MG tablet     Dysuria-urinalysis was actually negative today but will send for culture to confirm.  Especially since this is her third potential infection in a very short period of time.  We also discussed potentially doing prophylaxis for recurrent UTI.  Will check urine culture first though just to make sure that she does not have any antibiotic resistance.  For now I did go ahead and send over Bactrim to the pharmacy to get her through the weekend and see if this helps with her symptoms.  Meds ordered this encounter  Medications  . sulfamethoxazole-trimethoprim (BACTRIM DS) 800-160 MG tablet    Sig: Take 1 tablet by mouth 2 (two) times daily.    Dispense:  6 tablet    Refill:  0     Beatrice Lecher, MD

## 2018-12-25 NOTE — Progress Notes (Signed)
Error. Pt changed to in office appt.

## 2018-12-26 ENCOUNTER — Encounter: Payer: Self-pay | Admitting: Family Medicine

## 2018-12-26 LAB — URINE CULTURE
MICRO NUMBER:: 1119886
SPECIMEN QUALITY:: ADEQUATE

## 2019-03-01 IMAGING — US US PELVIS COMPLETE
1 series · 14 of 25 positions shown · non-contrast
Comparison: 02/01/2016

CLINICAL DATA: Dysfunctional uterine bleeding



[Series 1: us pelvis complete · 0.22mm/px · 14 of 54 slices shown]
[im 1/54]
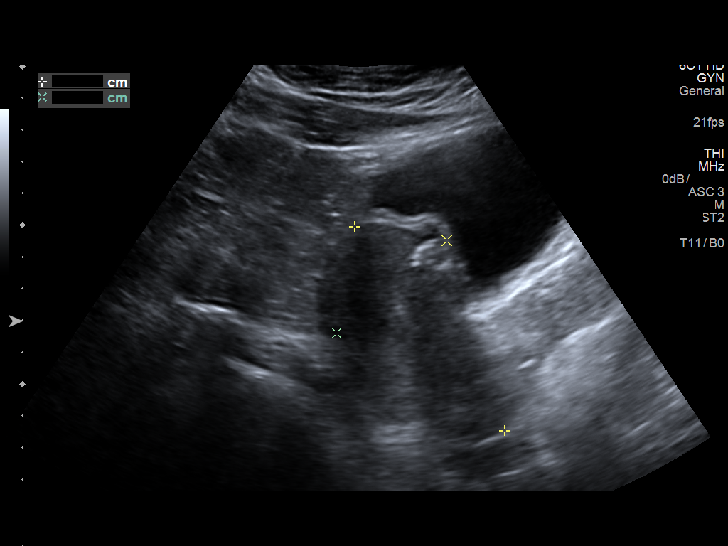
[im 5/54]
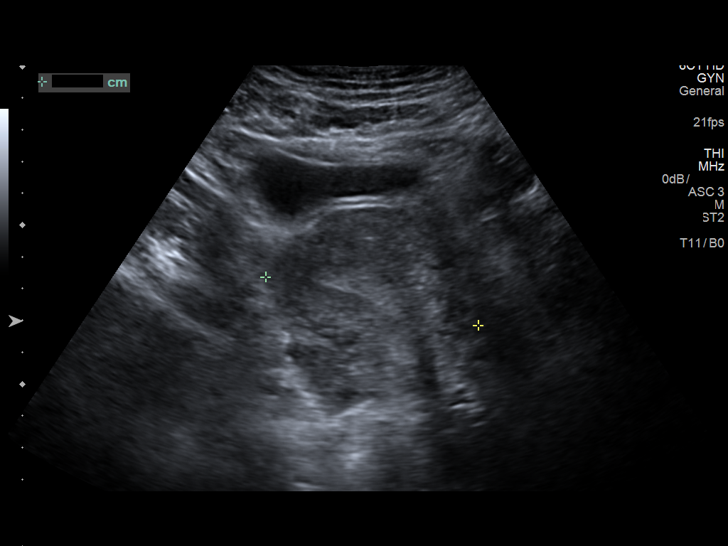
[im 9/54]
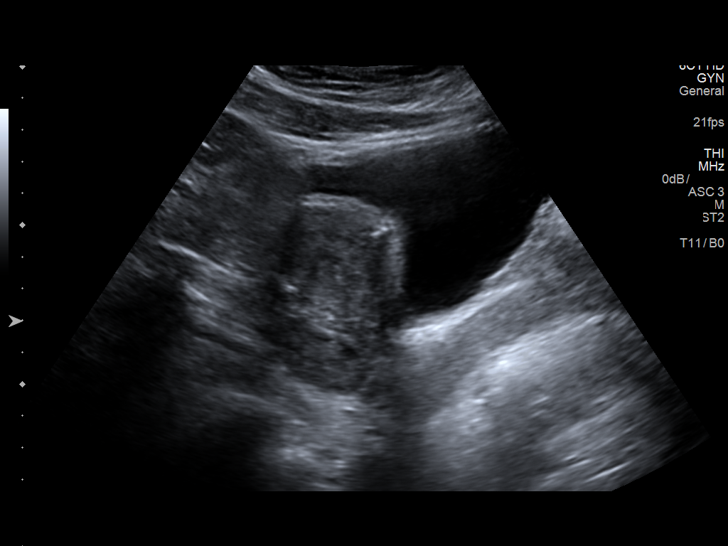
[im 14/54]
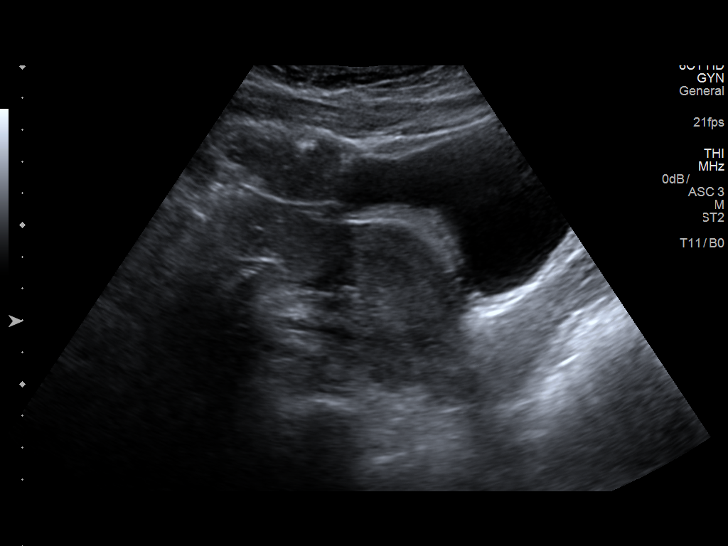
[im 18/54]
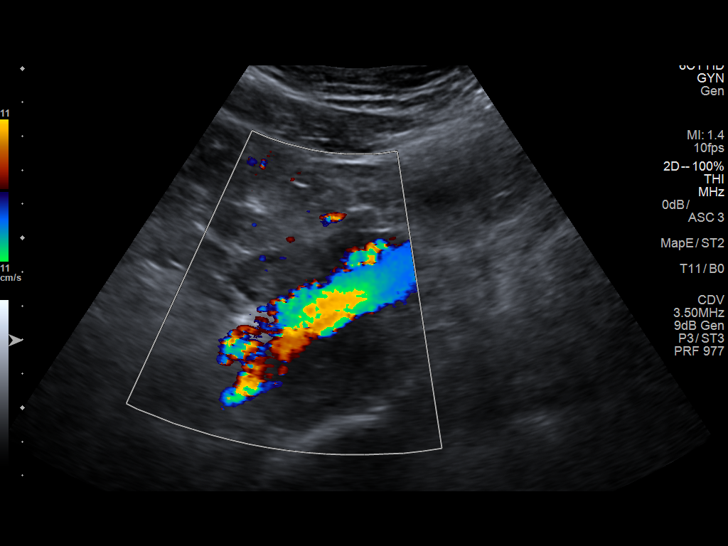
[im 20/54]
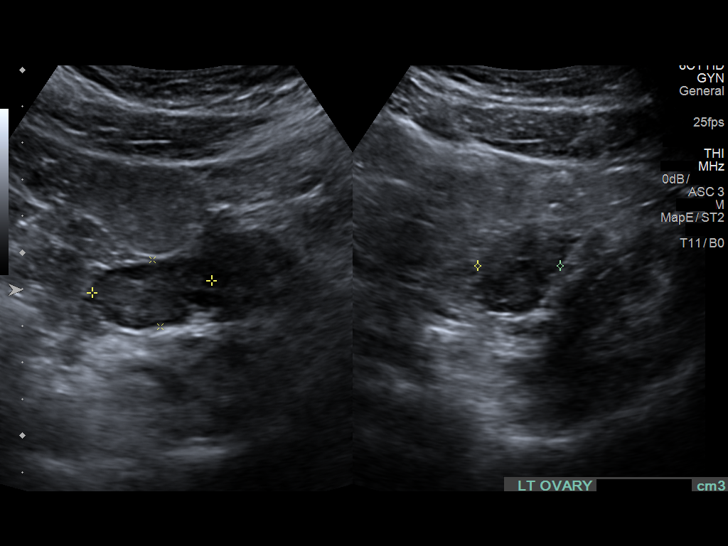
[im 25/54]
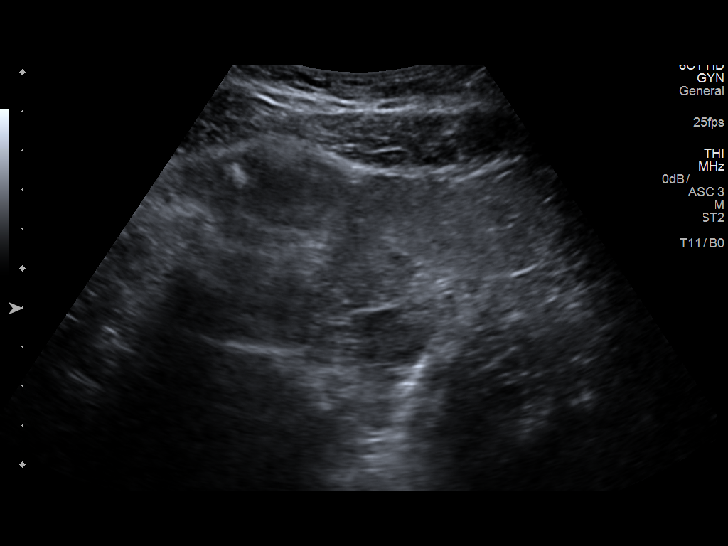
[im 29/54]
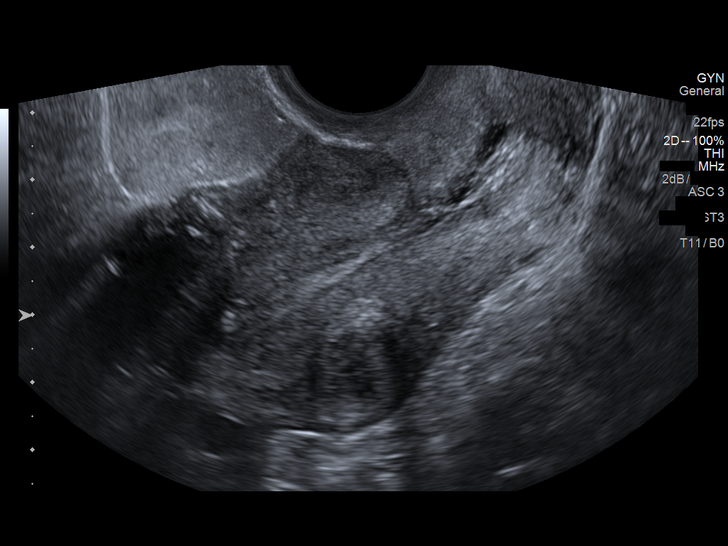
[im 34/54]
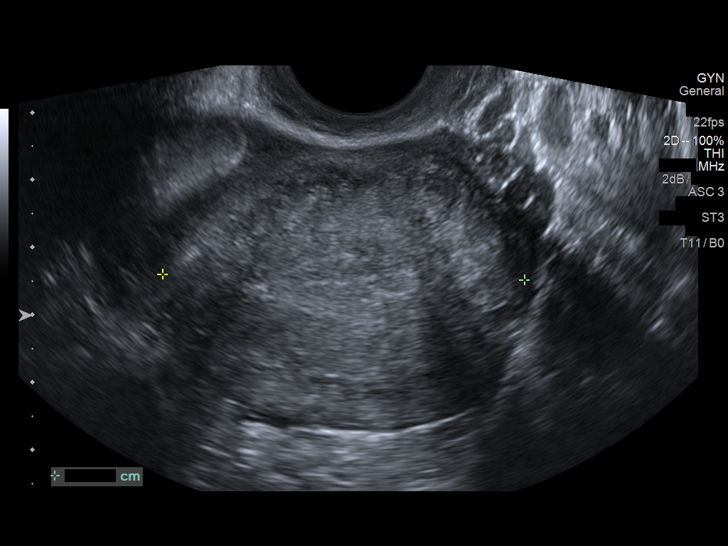
[im 36/54]
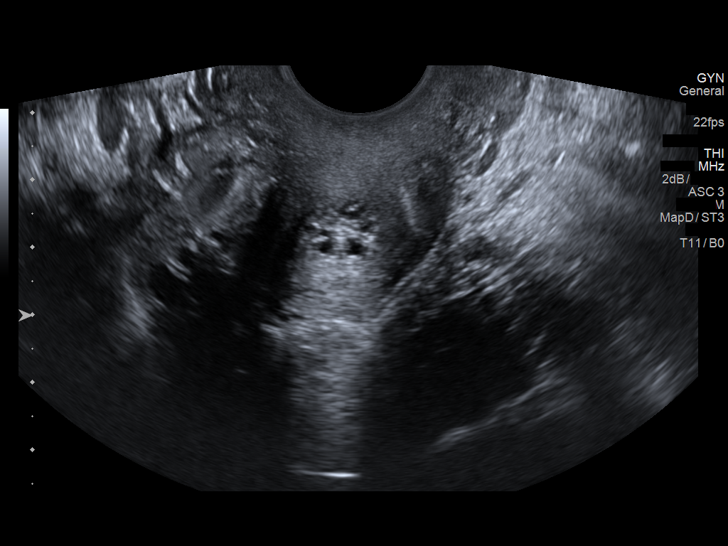
[im 40/54]
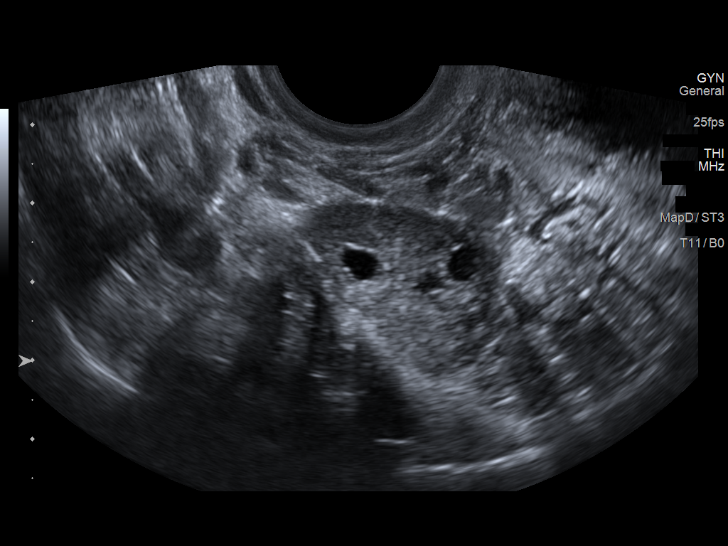
[im 45/54]
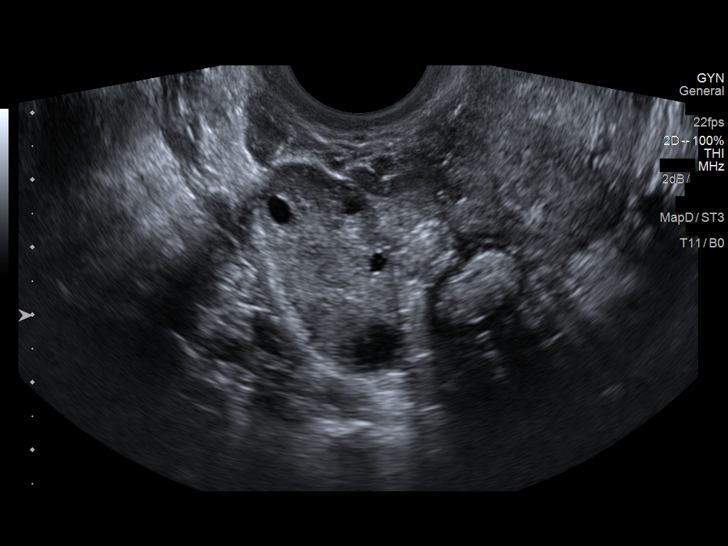
[im 49/54]
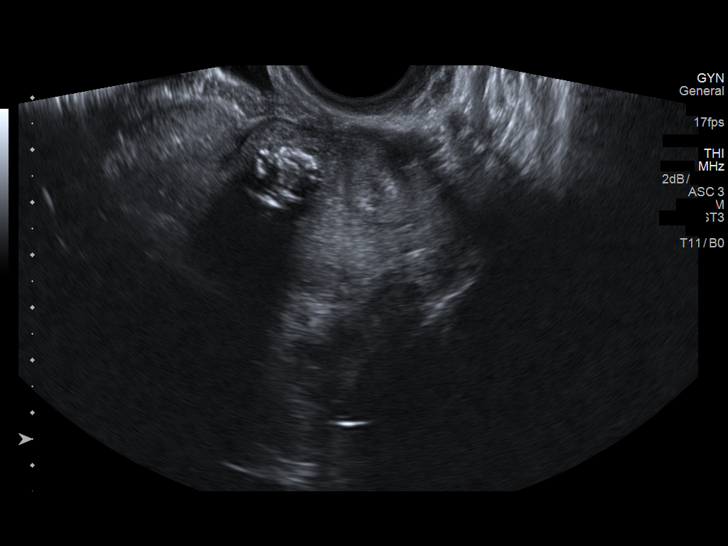
[im 54/54]
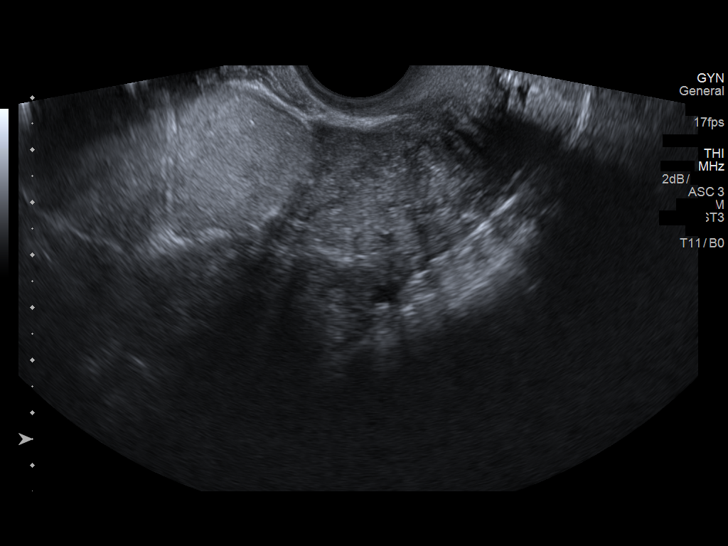

[14 of 25 positions shown; findings below may reference images not displayed]

FINDINGS: Uterus

Measurements: 7.5 x 4.5 x 5.9 cm. Calcified fibroids in the fundus,
measuring 1.6 cm and 1.4 cm maximally.

Endometrium

Thickness: 9 mm in thickness.  No focal abnormality visualized.

Right ovary

Measurements: 3.9 x 1.9 x 2.3 cm. Normal appearance/no adnexal mass.

Left ovary

Measurements: 4.3 x 1.8 x 2.4 cm. Normal appearance/no adnexal mass.

Other findings

No abnormal free fluid.
IMPRESSION: Small calcified fibroids within the uterus, similar to prior study.

No acute findings in the pelvis.

## 2019-10-13 ENCOUNTER — Ambulatory Visit: Payer: Self-pay | Admitting: Osteopathic Medicine

## 2019-10-13 ENCOUNTER — Ambulatory Visit: Payer: BC Managed Care – PPO | Admitting: Osteopathic Medicine

## 2019-10-22 ENCOUNTER — Ambulatory Visit (INDEPENDENT_AMBULATORY_CARE_PROVIDER_SITE_OTHER): Payer: Self-pay | Admitting: Nurse Practitioner

## 2019-10-22 ENCOUNTER — Encounter: Payer: Self-pay | Admitting: Nurse Practitioner

## 2019-10-22 VITALS — BP 106/66 | HR 75 | Temp 98.6°F | Ht 63.5 in | Wt 241.5 lb

## 2019-10-22 DIAGNOSIS — L309 Dermatitis, unspecified: Secondary | ICD-10-CM | POA: Insufficient documentation

## 2019-10-22 DIAGNOSIS — Z23 Encounter for immunization: Secondary | ICD-10-CM | POA: Insufficient documentation

## 2019-10-22 DIAGNOSIS — Z0000 Encounter for general adult medical examination without abnormal findings: Secondary | ICD-10-CM | POA: Insufficient documentation

## 2019-10-22 DIAGNOSIS — F603 Borderline personality disorder: Secondary | ICD-10-CM

## 2019-10-22 DIAGNOSIS — R7303 Prediabetes: Secondary | ICD-10-CM

## 2019-10-22 DIAGNOSIS — F332 Major depressive disorder, recurrent severe without psychotic features: Secondary | ICD-10-CM

## 2019-10-22 DIAGNOSIS — F909 Attention-deficit hyperactivity disorder, unspecified type: Secondary | ICD-10-CM

## 2019-10-22 MED ORDER — VENLAFAXINE HCL ER 150 MG PO CP24: 150.0000 mg | ORAL_CAPSULE | Freq: Every day | ORAL | 5 refills | Status: DC

## 2019-10-22 MED ORDER — AMPHETAMINE-DEXTROAMPHET ER 20 MG PO CP24: 20.0000 mg | ORAL_CAPSULE | ORAL | 0 refills | Status: DC

## 2019-10-22 MED ORDER — HYDROXYZINE HCL 10 MG PO TABS: 10.0000 mg | ORAL_TABLET | Freq: Every day | ORAL | 5 refills | Status: DC | PRN

## 2019-10-22 MED ORDER — MOMETASONE FUROATE 0.1 % EX CREA: 1.0000 "application " | TOPICAL_CREAM | Freq: Every day | CUTANEOUS | 0 refills | Status: DC

## 2019-10-22 NOTE — Assessment & Plan Note (Signed)
History of prediabetes in the setting of obesity. Orders for lab work today to evaluate this current diagnosis.

## 2019-10-22 NOTE — Assessment & Plan Note (Signed)
Annual physical exam for work provided today. Due to significant financial limitations we also addressed many of her other conditions today. Information provided on health maintenance and healthy lifestyles.  I do believe it would benefit her to start a diet and exercise regimen which would both help her physical and mental health. Labs ordered today. Paperwork filled out for the [employer] school health examination certificate.  We were unable to do the TB test today due to the limitations of reading it over the weekend.  Recommend that she seek care at an urgent care, health department, or minute clinic to have this performed. Plan to follow-up in 1 year for physical exam.

## 2019-10-22 NOTE — Assessment & Plan Note (Signed)
BMI 42.11 kg/m today.  She is at increased risk of multiple health conditions with the increased BMI and other comorbidities. Information provided on healthy dietary habits. Once her mental health is more stable we will discuss a diet and exercise regimen that may help her both improve her mental health and her physical health. She is experiencing significant limitations financially right now therefore will not make any great changes today.

## 2019-10-22 NOTE — Assessment & Plan Note (Addendum)
Longstanding history of ADHD.   She was previously on Adderall 20 mg/day for her symptoms.  The short acting formula was difficult to maintain and cause a fluctuation of her symptoms.   Financial limitations prevented her from following up regularly and therefore she stopped taking the medication.  Since that time she has had an exacerbation of her ADHD.  Given her new job it will be important for her to remain focused. Plan to trial Adderall 20 mg extended release version to see if this is effective in controlling her ADHD symptoms and does not exacerbate depressed mood. We did discuss the option of trialing Vyvanse.  Discussed with patient if Adderall is not effective to let me know and we will change the prescription as needed. Plan to follow-up in approximately 6 weeks.

## 2019-10-22 NOTE — Progress Notes (Signed)
BP 106/66   Pulse 75   Temp 98.6 F (37 C) (Oral)   Ht 5' 3.5" (1.613 m)   Wt 241 lb 8 oz (109.5 kg)   LMP 10/01/2019   SpO2 99%   BMI 42.11 kg/m    Subjective:    Patient ID: Tasha Barrett, female    DOB: 05-Aug-1990, 29 y.o.   MRN: [id number]  HPI: Tasha Barrett is a 29 y.o. female presenting on 10/22/2019 for comprehensive medical examination.  Current medical diagnoses include:  Obesity, Pre-Diabetes, MDD, borderline personality d/o, PTSD, and ADHD.  She currently lives with: her daughter Interim Problems from her last visit:  Kalanie it is experiencing significant emotional distress today. She reports her husband quit his job in March of this year and at that time they lost their health insurance.  She has been unable to afford to come to the doctor for regular visits since that time.  She also reports that she and her husband have subsequently separated.  These events have impacted her emotional health significantly.  She also reports that she had a recent pregnancy that she was unable to follow through with due to the separation and financial limitations, which is understandably distressing to her.  She has been able to continue with a counselor with virtual sessions and she feels that that is helpful but she is unable to afford a psychiatrist right now to prescribe her medications for her mental health. 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  She does not currently have insurance with the school system as she is still in the [employer] stages of the employment process. She is experiencing significant financial hardship's.  She reports that she has applied for Medicaid and was able to get some services for her daughter but has not been able to get services for herself at this time. She reports she has been able to  continue on her venlafaxine but she has not been able to take it as consistently as she knows she needs to. She states she is incredibly emotionally overwhelmed right now and is really struggling with her situation.  She denies any thoughts of self-harm at this time.  She would like to try to get back on her ADHD medication.  She was taking Adderall 20 mg in the past but the short acting form was not as effective as she would like it to be.  She would like to try the extended release today if possible.  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  She endorses ETOH use of none She endorses nictoine use of none She endorses illegal substance use of marijuana socially, less than once per month   She reports regular menstrual periods with normal flow and mild cramping in a 26-30 day cycle Current menopausal symptoms: no She is endorses sexually active with 2 partners. She denies concerns today about STI Contraception choices are: none  She denies concerns about bowel changes today: none She denies concerns about bladder changes today: frequent urination   Depression Screen done today and results listed below:  Depression screen [facility] 2/9 10/22/2019 12/25/2018 09/05/2018 01/22/2018 08/26/2013  Decreased Interest 2 0 3 3 0  Down, Depressed, Hopeless 2  0 3 3 0  PHQ - 2 Score 4 0 6 6 0  Altered sleeping 2 - 3 3 -  Tired, decreased energy 3 - 3 3 -  Change in appetite 2 - 3 3 -  Feeling bad or failure about yourself  3 - 3 3 -  Trouble concentrating 3 - 3 3 -  Moving slowly or fidgety/restless 1 - 2 1 -  Suicidal thoughts 1 - 1 2 -  PHQ-9 Score 19 - 24 24 -  Difficult doing work/chores Very difficult - Extremely dIfficult Very difficult -  Some encounter information is confidential and restricted. Go to Review Flowsheets activity to see all data.    She does not have a history of  falls. I did not complete a risk assessment for falls. A plan of care for falls was not documented.   Past Medical History:  Past Medical History:  Diagnosis Date  . ADHD   . Anxiety    Used Latuda (did not work)  . Bipolar 2 disorder (HCC)   . Bipolar disorder (HCC)   . Depression   . Pneumonia    age 73   . PTSD (post-traumatic stress disorder)   . Yeast infection     Surgical History:  Past Surgical History:  Procedure Laterality Date  . BREAST REDUCTION SURGERY  01/2015  . CESAREAN SECTION N/A 03/09/2014   Procedure: CESAREAN SECTION;  Surgeon: Reva Bores, MD;  Location: [facility];  Service: Obstetrics;  Laterality: N/A;    Medications:  No current outpatient medications on file prior to visit.   No current facility-administered medications on file prior to visit.    Allergies:  Allergies  Allergen Reactions  . Amoxicillin-Pot Clavulanate Other (See Comments)    As child - pt reports NO Hx anaphylaxis, thinks was hives  . Latex Other (See Comments)    Possible yeast infection, after latex condom use    Social History:  Social History   Socioeconomic History  . Marital status: Married    Spouse name: Not on file  . Number of children: Not on file  . Years of education: Not on file  . Highest education level: Not on file  Occupational History  . Occupation: employed  Tobacco Use  . Smoking status: Never Smoker  . Smokeless tobacco: Never Used  Vaping Use  . Vaping Use: Some days  . Substances: Mixture of cannabinoids  Substance and Sexual Activity  . Alcohol use: Not Currently  . Drug use: Yes    Types: Marijuana    Comment: States 5-6 times/year; through vaping  . Sexual activity: Yes    Partners: Male    Birth control/protection: None  Other Topics Concern  . Not on file  Social History Narrative  . Not on file   Social Determinants of Health   Financial Resource Strain:   . Difficulty of Paying Living Expenses: Not on file  Food  Insecurity:   . Worried About Programme researcher, broadcasting/film/video in the Last Year: Not on file  . Ran Out of Food in the Last Year: Not on file  Transportation Needs:   . Lack of Transportation (Medical): Not on file  . Lack of Transportation (Non-Medical): Not on file  Physical Activity:   . Days of Exercise per Week: Not on file  . Minutes of Exercise per Session: Not on file  Stress:   . Feeling of Stress : Not on file  Social Connections:   . Frequency of  Communication with Friends and Family: Not on file  . Frequency of Social Gatherings with Friends and Family: Not on file  . Attends Religious Services: Not on file  . Active Member of Clubs or Organizations: Not on file  . Attends Banker Meetings: Not on file  . Marital Status: Not on file  Intimate Partner Violence:   . Fear of Current or Ex-Partner: Not on file  . Emotionally Abused: Not on file  . Physically Abused: Not on file  . Sexually Abused: Not on file   Social History   Tobacco Use  Smoking Status Never Smoker  Smokeless Tobacco Never Used   Social History   Substance and Sexual Activity  Alcohol Use Not Currently    Family History:  Family History  Problem Relation Age of Onset  . Depression Mother   . Gout Father   . Hypertension Father   . Gout Paternal Uncle   . Gout Paternal Grandfather   . Anesthesia problems Neg Hx   . Hypotension Neg Hx   . Malignant hyperthermia Neg Hx   . Pseudochol deficiency Neg Hx     Past medical history, surgical history, medications, allergies, family history and social history reviewed with patient today and changes made to appropriate areas of the chart.   All ROS negative except what is listed above and in the HPI.      Objective:    BP 106/66   Pulse 75   Temp 98.6 F (37 C) (Oral)   Ht 5' 3.5" (1.613 m)   Wt 241 lb 8 oz (109.5 kg)   LMP 10/01/2019   SpO2 99%   BMI 42.11 kg/m   Wt Readings from Last 3 Encounters:  10/22/19 241 lb 8 oz (109.5 kg)   12/25/18 248 lb 3.2 oz (112.6 kg)  11/12/18 246 lb (111.6 kg)    Physical Exam Vitals and nursing note reviewed.  Constitutional:      Appearance: She is obese.  HENT:     Head: Normocephalic.     Right Ear: Tympanic membrane normal.     Left Ear: Tympanic membrane normal.     Nose: Nose normal.     Mouth/Throat:     Mouth: Mucous membranes are moist.     Pharynx: Oropharynx is clear.  Eyes:     Extraocular Movements: Extraocular movements intact.     Conjunctiva/sclera: Conjunctivae normal.     Pupils: Pupils are equal, round, and reactive to light.  Cardiovascular:     Rate and Rhythm: Normal rate and regular rhythm.     Pulses: Normal pulses.     Heart sounds: Normal heart sounds.  Pulmonary:     Effort: Pulmonary effort is normal.     Breath sounds: Normal breath sounds.  Abdominal:     General: Bowel sounds are normal. There is no distension.     Palpations: Abdomen is soft.     Tenderness: There is no right CVA tenderness or left CVA tenderness.  Musculoskeletal:        General: Normal range of motion.     Cervical back: Normal range of motion.     Right lower leg: No edema.     Left lower leg: No edema.  Skin:    General: Skin is warm and dry.     Capillary Refill: Capillary refill takes less than 2 seconds.  Neurological:     General: No focal deficit present.     Mental Status: She is alert  and oriented to person, place, and time.  Psychiatric:        Attention and Perception: Attention normal.        Mood and Affect: Mood is anxious and depressed. Affect is tearful.        Speech: Speech is rapid and pressured.        Behavior: Behavior is cooperative.        Thought Content: Thought content normal.        Cognition and Memory: Cognition normal.        Judgment: Judgment normal.      Assessment & Plan:   Problem List Items Addressed This Visit      Musculoskeletal and Integument   Eczema    Exacerbation of eczema most likely due to increased  anxiety and stress due to recent life events. Recommend regular moisturizer to the skin in affected areas at least twice a day. Prescription provided for mometasone to be applied to affected areas, avoiding the face, twice a day as needed for outbreak.      Relevant Medications   mometasone (ELOCON) 0.1 % cream     Other   Morbid obesity (HCC)    BMI 42.11 kg/m today.  She is at increased risk of multiple health conditions with the increased BMI and other comorbidities. Information provided on healthy dietary habits. Once her mental health is more stable we will discuss a diet and exercise regimen that may help her both improve her mental health and her physical health. She is experiencing significant limitations financially right now therefore will not make any great changes today.      Relevant Medications   amphetamine-dextroamphetamine (ADDERALL XR) 20 MG 24 hr capsule   amphetamine-dextroamphetamine (ADDERALL XR) 20 MG 24 hr capsule (Start on 11/21/2019)   amphetamine-dextroamphetamine (ADDERALL XR) 20 MG 24 hr capsule (Start on 12/21/2019)   Other Relevant Orders   CBC with Differential/Platelet   COMPLETE METABOLIC PANEL WITH GFR   Lipid panel   Hemoglobin A1c   PSG Sleep Study   Borderline personality disorder (HCC)    Longstanding history of borderline personality disorder. She has experiencing significant symptoms of her major depressive disorder with a severe exacerbation at this time which can lead to worsening symptoms with borderline personality. She has done well on Effexor.  She has tried multiple medications in the past with no improvement or exacerbation of symptoms therefore we will make no changes today. Prescription provided for Effexor 150 mg/day. Due to current financial situation we will plan to follow-up remotely in approximately 6 weeks.  Plan to follow-up in person in 6 months or sooner if needed.      Relevant Medications   venlafaxine XR (EFFEXOR-XR)  150 MG 24 hr capsule   hydrOXYzine (ATARAX/VISTARIL) 10 MG tablet   MDD (major depressive disorder), recurrent episode, severe (HCC)    Symptoms and presentation consistent with severe recurrence of major depressive disorder given recent life circumstances including loss of financial security, separation from her husband, ending a recent pregnancy, and starting a new job. I strongly feel that getting back on her medication on a regular regiment will be significantly beneficial to her mental health. Strongly encouraged that she continue with therapy sessions as her life events alone are significant and can be debilitating even outside of the setting of a mental health disorder. Prescription for venlafaxine 150 mg provided for daily use.  Prescription for hydroxyzine 10 mg to be taken at bedtime and as needed for anxiety/panic provided.  Given the current financial restraints that she is experiencing we will plan to touch base virtually to discuss her medication regimen.      Relevant Medications   venlafaxine XR (EFFEXOR-XR) 150 MG 24 hr capsule   hydrOXYzine (ATARAX/VISTARIL) 10 MG tablet   Prediabetes    History of prediabetes in the setting of obesity. Orders for lab work today to evaluate this current diagnosis.       Relevant Orders   CBC with Differential/Platelet   COMPLETE METABOLIC PANEL WITH GFR   Lipid panel   Hemoglobin A1c   ADHD    Longstanding history of ADHD.   She was previously on Adderall 20 mg/day for her symptoms.  The short acting formula was difficult to maintain and cause a fluctuation of her symptoms.   Financial limitations prevented her from following up regularly and therefore she stopped taking the medication.  Since that time she has had an exacerbation of her ADHD.  Given her new job it will be important for her to remain focused. Plan to trial Adderall 20 mg extended release version to see if this is effective in controlling her ADHD symptoms and does not  exacerbate depressed mood. We did discuss the option of trialing Vyvanse.  Discussed with patient if Adderall is not effective to let me know and we will change the prescription as needed. Plan to follow-up in approximately 6 weeks.      Relevant Medications   amphetamine-dextroamphetamine (ADDERALL XR) 20 MG 24 hr capsule   amphetamine-dextroamphetamine (ADDERALL XR) 20 MG 24 hr capsule (Start on 11/21/2019)   amphetamine-dextroamphetamine (ADDERALL XR) 20 MG 24 hr capsule (Start on 12/21/2019)   Encounter for annual physical exam - Primary    Annual physical exam for work provided today. Due to significant financial limitations we also addressed many of her other conditions today. Information provided on health maintenance and healthy lifestyles.  I do believe it would benefit her to start a diet and exercise regimen which would both help her physical and mental health. Labs ordered today. Paperwork filled out for the [employer] school health examination certificate.  We were unable to do the TB test today due to the limitations of reading it over the weekend.  Recommend that she seek care at an urgent care, health department, or minute clinic to have this performed. Plan to follow-up in 1 year for physical exam.      Relevant Orders   CBC with Differential/Platelet   COMPLETE METABOLIC PANEL WITH GFR   Lipid panel   Hemoglobin A1c   Hepatitis C antibody   PSG Sleep Study   Ambulatory referral to Social Work   Need for influenza vaccination    Flu vaccine provided today.      Relevant Orders   Flu Vaccine QUAD 36+ mos IM (Completed)       Follow up plan: Return in about 6 months (around 04/20/2020).   LABORATORY TESTING:  - Pap smear: up to date Due 10/22/2021 - STI testing: Deferred  IMMUNIZATIONS:   - Tdap: Tetanus vaccination status reviewed: last tetanus booster within 10 years. Due in 2025 - Influenza: Given today - COVID-19: 2 doses of [place]  completed. - Pneumovax: Not applicable - Prevnar: Not applicable - HPV: Given elsewhere - Zostavax vaccine: Not applicable  SCREENING: -Mammogram: Not applicable  - Colonoscopy: Not applicable  - Bone Density: Not applicable    PATIENT COUNSELING:   Advised to take 1 mg of folate supplement per day if  capable of pregnancy.   Sexuality: Discussed sexually transmitted diseases, partner selection, use of condoms, avoidance of unintended pregnancy  and contraceptive alternatives.   Advised to avoid cigarette smoking.  I discussed with the patient that most people either abstain from alcohol or drink within safe limits (<=14/week and <=4 drinks/occasion for males, <=7/weeks and <= 3 drinks/occasion for females) and that the risk for alcohol disorders and other health effects rises proportionally with the number of drinks per week and how often a drinker exceeds daily limits.  Discussed cessation/primary prevention of drug use and availability of treatment for abuse.   Diet: Encouraged to adjust caloric intake to maintain  or achieve ideal body weight, to reduce intake of dietary saturated fat and total fat, to limit sodium intake by avoiding high sodium foods and not adding table salt, and to maintain adequate dietary potassium and calcium preferably from fresh fruits, vegetables, and low-fat dairy products.    stressed the importance of regular exercise  Injury prevention: Discussed safety belts, safety helmets, smoke detector, smoking near bedding or upholstery.   Dental health: Discussed importance of regular tooth brushing, flossing, and dental visits.    NEXT PREVENTATIVE PHYSICAL DUE IN 1 YEAR. Return in about 6 months (around 04/20/2020).

## 2019-10-22 NOTE — Patient Instructions (Addendum)
Influenza (Flu) Vaccine (Inactivated or Recombinant): What You Need to Know 1. Why get vaccinated? Influenza vaccine can prevent influenza (flu). Flu is a contagious disease that spreads around the [place] every year, usually between October and May. Anyone can get the flu, but it is more dangerous for some people. Infants and young children, people 29 years of age and older, pregnant women, and people with certain health conditions or a weakened immune system are at greatest risk of flu complications. Pneumonia, bronchitis, sinus infections and ear infections are examples of flu-related complications. If you have a medical condition, such as heart disease, cancer or diabetes, flu can make it worse. Flu can cause fever and chills, sore throat, muscle aches, fatigue, cough, headache, and runny or stuffy nose. Some people may have vomiting and diarrhea, though this is more common in children than adults. Each year thousands of people in the [place] die from flu, and many more are hospitalized. Flu vaccine prevents millions of illnesses and flu-related visits to the doctor each year. 2. Influenza vaccine CDC recommends everyone 57 months of age and older get vaccinated every flu season. Children 6 months through 34 years of age may need 2 doses during a single flu season. Everyone else needs only 1 dose each flu season. It takes about 2 weeks for protection to develop after vaccination. There are many flu viruses, and they are always changing. Each year a new flu vaccine is made to protect against three or four viruses that are likely to cause disease in the upcoming flu season. Even when the vaccine doesn't exactly match these viruses, it may still provide some protection. Influenza vaccine does not cause flu. Influenza vaccine may be given at the same time as other vaccines. 3. Talk with your health care provider Tell your vaccine provider if the person getting the vaccine:  Has had an  allergic reaction after a previous dose of influenza vaccine, or has any severe, life-threatening allergies.  Has ever had Guillain-Barr Syndrome (also called GBS). In some cases, your health care provider may decide to postpone influenza vaccination to a future visit. People with minor illnesses, such as a cold, may be vaccinated. People who are moderately or severely ill should usually wait until they recover before getting influenza vaccine. Your health care provider can give you more information. 4. Risks of a vaccine reaction  Soreness, redness, and swelling where shot is given, fever, muscle aches, and headache can happen after influenza vaccine.  There may be a very small increased risk of Guillain-Barr Syndrome (GBS) after inactivated influenza vaccine (the flu shot). Young children who get the flu shot along with pneumococcal vaccine (PCV13), and/or DTaP vaccine at the same time might be slightly more likely to have a seizure caused by fever. Tell your health care provider if a child who is getting flu vaccine has ever had a seizure. People sometimes faint after medical procedures, including vaccination. Tell your provider if you feel dizzy or have vision changes or ringing in the ears. As with any medicine, there is a very remote chance of a vaccine causing a severe allergic reaction, other serious injury, or death. 5. What if there is a serious problem? An allergic reaction could occur after the vaccinated person leaves the clinic. If you see signs of a severe allergic reaction (hives, swelling of the face and throat, difficulty breathing, a fast heartbeat, dizziness, or weakness), call 9-1-1 and get the person to the nearest hospital. For other signs that  concern you, call your health care provider. Adverse reactions should be reported to the Vaccine Adverse Event Reporting System (VAERS). Your health care provider will usually file this report, or you can do it yourself. Visit the  VAERS website at www.vaers.SamedayNews.es or call (571)534-9455.VAERS is only for reporting reactions, and VAERS staff do not give medical advice. 6. The National Vaccine Injury Compensation Program The [employer] Vaccine Injury Compensation Program (VICP) is a federal program that was created to compensate people who may have been injured by certain vaccines. Visit the VICP website at GoldCloset.com.ee or call 256-744-1816 to learn about the program and about filing a claim. There is a time limit to file a claim for compensation. 7. How can I learn more?  Ask your healthcare provider.  Call your local or state health department.  Contact the Centers for Disease Control and Prevention (CDC): ? Call 725-799-2020 (1-800-CDC-INFO) or ? Visit CDC's https://gibson.com/ Vaccine Information Statement (Interim) Inactivated Influenza Vaccine (09/19/2017) This information is not intended to replace advice given to you by your health care provider. Make sure you discuss any questions you have with your health care provider. Document Revised: 05/13/2018 Document Reviewed: 09/23/2017 Elsevier Patient Education  [place] 31-47 Years Old, Female Preventive care refers to visits with your health care provider and lifestyle choices that can promote health and wellness. This includes:  A yearly physical exam. This may also be called an annual well check.  Regular dental visits and eye exams.  Immunizations.  Screening for certain conditions.  Healthy lifestyle choices, such as eating a healthy diet, getting regular exercise, not using drugs or products that contain nicotine and tobacco, and limiting alcohol use. What can I expect for my preventive care visit? Physical exam Your health care provider will check your:  Height and weight. This may be used to calculate body mass index (BMI), which tells if you are at a healthy weight.  Heart rate and blood  pressure.  Skin for abnormal spots. Counseling Your health care provider may ask you questions about your:  Alcohol, tobacco, and drug use.  Emotional well-being.  Home and relationship well-being.  Sexual activity.  Eating habits.  Work and work Statistician.  Method of birth control.  Menstrual cycle.  Pregnancy history. What immunizations do I need?  Influenza (flu) vaccine  This is recommended every year. Tetanus, diphtheria, and pertussis (Tdap) vaccine  You may need a Td booster every 10 years. Varicella (chickenpox) vaccine  You may need this if you have not been vaccinated. Human papillomavirus (HPV) vaccine  If recommended by your health care provider, you may need three doses over 6 months. Measles, mumps, and rubella (MMR) vaccine  You may need at least one dose of MMR. You may also need a second dose. Meningococcal conjugate (MenACWY) vaccine  One dose is recommended if you are age 41-21 years and a first-year college student living in a residence hall, or if you have one of several medical conditions. You may also need additional booster doses. Pneumococcal conjugate (PCV13) vaccine  You may need this if you have certain conditions and were not previously vaccinated. Pneumococcal polysaccharide (PPSV23) vaccine  You may need one or two doses if you smoke cigarettes or if you have certain conditions. Hepatitis A vaccine  You may need this if you have certain conditions or if you travel or work in places where you may be exposed to hepatitis A. Hepatitis B vaccine  You may need this if  you have certain conditions or if you travel or work in places where you may be exposed to hepatitis B. Haemophilus influenzae type b (Hib) vaccine  You may need this if you have certain conditions. You may receive vaccines as individual doses or as more than one vaccine together in one shot (combination vaccines). Talk with your health care provider about the risks  and benefits of combination vaccines. What tests do I need?  Blood tests  Lipid and cholesterol levels. These may be checked every 5 years starting at age 73.  Hepatitis C test.  Hepatitis B test. Screening  Diabetes screening. This is done by checking your blood sugar (glucose) after you have not eaten for a while (fasting).  Sexually transmitted disease (STD) testing.  BRCA-related cancer screening. This may be done if you have a family history of breast, ovarian, tubal, or peritoneal cancers.  Pelvic exam and Pap test. This may be done every 3 years starting at age 29. Starting at age 18, this may be done every 5 years if you have a Pap test in combination with an HPV test. Talk with your health care provider about your test results, treatment options, and if necessary, the need for more tests. Follow these instructions at home: Eating and drinking   Eat a diet that includes fresh fruits and vegetables, whole grains, lean protein, and low-fat dairy.  Take vitamin and mineral supplements as recommended by your health care provider.  Do not drink alcohol if: ? Your health care provider tells you not to drink. ? You are pregnant, may be pregnant, or are planning to become pregnant.  If you drink alcohol: ? Limit how much you have to 0-1 drink a day. ? Be aware of how much alcohol is in your drink. In the [place] one drink equals one 12 oz bottle of beer (355 mL), one 5 oz glass of wine (148 mL), or one 1 oz glass of hard liquor (44 mL). Lifestyle  Take daily care of your teeth and gums.  Stay active. Exercise for at least 30 minutes on 5 or more days each week.  Do not use any products that contain nicotine or tobacco, such as cigarettes, e-cigarettes, and chewing tobacco. If you need help quitting, ask your health care provider.  If you are sexually active, practice safe sex. Use a condom or other form of birth control (contraception) in order to prevent pregnancy and STIs  (sexually transmitted infections). If you plan to become pregnant, see your health care provider for a preconception visit. What's next?  Visit your health care provider once a year for a well check visit.  Ask your health care provider how often you should have your eyes and teeth checked.  Stay up to date on all vaccines. This information is not intended to replace advice given to you by your health care provider. Make sure you discuss any questions you have with your health care provider. Document Revised: 10/03/2017 Document Reviewed: 10/03/2017 Elsevier Patient Education  2020 [employer].

## 2019-10-22 NOTE — Assessment & Plan Note (Signed)
Flu vaccine provided today.  

## 2019-10-22 NOTE — Assessment & Plan Note (Signed)
Symptoms and presentation consistent with severe recurrence of major depressive disorder given recent life circumstances including loss of financial security, separation from her husband, ending a recent pregnancy, and starting a new job. I strongly feel that getting back on her medication on a regular regiment will be significantly beneficial to her mental health. Strongly encouraged that she continue with therapy sessions as her life events alone are significant and can be debilitating even outside of the setting of a mental health disorder. Prescription for venlafaxine 150 mg provided for daily use.  Prescription for hydroxyzine 10 mg to be taken at bedtime and as needed for anxiety/panic provided.  Given the current financial restraints that she is experiencing we will plan to touch base virtually to discuss her medication regimen.

## 2019-10-22 NOTE — Assessment & Plan Note (Signed)
Longstanding history of borderline personality disorder. She has experiencing significant symptoms of her major depressive disorder with a severe exacerbation at this time which can lead to worsening symptoms with borderline personality. She has done well on Effexor.  She has tried multiple medications in the past with no improvement or exacerbation of symptoms therefore we will make no changes today. Prescription provided for Effexor 150 mg/day. Due to current financial situation we will plan to follow-up remotely in approximately 6 weeks.  Plan to follow-up in person in 6 months or sooner if needed.

## 2019-10-22 NOTE — Assessment & Plan Note (Signed)
Exacerbation of eczema most likely due to increased anxiety and stress due to recent life events. Recommend regular moisturizer to the skin in affected areas at least twice a day. Prescription provided for mometasone to be applied to affected areas, avoiding the face, twice a day as needed for outbreak.

## 2019-10-23 ENCOUNTER — Encounter: Payer: Self-pay | Admitting: Nurse Practitioner

## 2019-10-26 ENCOUNTER — Other Ambulatory Visit: Payer: Self-pay | Admitting: Nurse Practitioner

## 2020-08-29 NOTE — Progress Notes (Signed)
Annual Exam   BP 137/88   Pulse 81   Resp 16   Wt 251 lb 3.2 oz (113.9 kg)   SpO2 99%   BMI 43.80 kg/m    Subjective:    Patient ID: Tasha Barrett, female    DOB: 1990-07-13, 30 y.o.   MRN: [id number]  HPI: Tasha Barrett is a 30 y.o. female presenting on 08/30/2020 for comprehensive medical examination. Current medical complaints include: none  She currently lives with: daughter 45 years old Interim Problems from her last visit: no   She reports regular vision exams q1-5y: no She reports regular dental exams q 45m no Her diet consists of: poor diet, planning to do sugar detox, weight loss goals She endorses exercise and/or activity of: fairly sedentary  She works at: [employer] She reports ETOH use. - 3x per year, social drinking She denies nictoine use. She endorses illegal substance use - occasional marijuana (<10 times per year)   She reports regular menstrual periods with normal flow. LMP 08/23/20 Current menopausal symptoms: no She is currently  sexually active with  female  partners (more than one). She has concerns today about STI, does not use protection, no symptoms Contraception choices are: none  She denies concerns about skin changes today. She denies concerns about bowel changes today. She denies concerns about bladder changes today - reports she has been having polyuria, polydipsia. Hx of prediabetes, obese. Will add to lab work.   Depression Screen done today and results listed below:  [facility] 08/30/2020 10/22/2019 12/25/2018 09/05/2018 01/22/2018  Decreased Interest 1 2 0 3 3  Down, Depressed, Hopeless 1 2 0 3 3  PHQ - 2 Score 2 4 0 6 6  Altered sleeping 3 2 - 3 3  Tired, decreased energy 1 3 - 3 3  Change in appetite 1 2 - 3 3  Feeling bad or failure about yourself  1 3 - 3 3  Trouble concentrating 2 3 - 3 3  Moving slowly or fidgety/restless 0 1 - 2 1  Suicidal thoughts 0 1 - 1 2  PHQ-9 Score 10 19 - 24 24   Difficult doing work/chores Not difficult at all Very difficult - Extremely dIfficult Very difficult  Some encounter information is confidential and restricted. Go to Review Flowsheets activity to see all data.   GAD 7 : Generalized Anxiety Score 08/30/2020 10/22/2019 09/05/2018 01/22/2018  Nervous, Anxious, on Edge '1 3 2 2  '$ Control/stop worrying '2 2 2 2  '$ Worry too much - different things '2 1 2 3  '$ Trouble relaxing '2 3 2 2  '$ Restless '3 2 1 1  '$ Easily annoyed or irritable '1 1 3 2  '$ Afraid - awful might happen 0 '1 1 2  '$ Total GAD 7 Score '11 13 13 14  '$ Anxiety Difficulty Somewhat difficult Somewhat difficult Very difficult Somewhat difficult  Some encounter information is confidential and restricted. Go to Review Flowsheets activity to see all data.     She does not have a history of falls.     Past Medical History:  Past Medical History:  Diagnosis Date   ADHD    Anxiety    Used Latuda (did not work)   Bipolar 2 disorder ([facility]    Bipolar disorder ([facility]    Depression    Pneumonia    age 30   PTSD (post-traumatic stress disorder)    Yeast infection     Surgical History:  Past Surgical  History:  Procedure Laterality Date   BREAST REDUCTION SURGERY  01/2015   CESAREAN SECTION N/A 03/09/2014   Procedure: CESAREAN SECTION;  Surgeon: Donnamae Jude, MD;  Location: [facility];  Service: Obstetrics;  Laterality: N/A;    Medications:  No current outpatient medications on file prior to visit.   No current facility-administered medications on file prior to visit.    Allergies:  Allergies  Allergen Reactions   Amoxicillin-Pot Clavulanate Other (See Comments)    As child - pt reports NO Hx anaphylaxis, thinks was hives   Latex Other (See Comments)    Possible yeast infection, after latex condom use    Social History:  Social History   Socioeconomic History   Marital status: Married    Spouse name: Not on file   Number of children: Not on file   Years of education: Not on file    Highest education level: Not on file  Occupational History   Occupation: employed  Tobacco Use   Smoking status: Never   Smokeless tobacco: Never  Vaping Use   Vaping Use: Some days   Substances: Mixture of cannabinoids  Substance and Sexual Activity   Alcohol use: Not Currently   Drug use: Yes    Types: Marijuana    Comment: States 5-6 times/year; through vaping   Sexual activity: Yes    Partners: Male    Birth control/protection: None  Other Topics Concern   Not on file  Social History Narrative   Not on file   Social Determinants of Health   Financial Resource Strain: Not on file  Food Insecurity: Not on file  Transportation Needs: Not on file  Physical Activity: Not on file  Stress: Not on file  Social Connections: Not on file  Intimate Partner Violence: Not on file   Social History   Tobacco Use  Smoking Status Never  Smokeless Tobacco Never   Social History   Substance and Sexual Activity  Alcohol Use Not Currently    Family History:  Family History  Problem Relation Age of Onset   Depression Mother    Gout Father    Hypertension Father    Gout Paternal Uncle    Gout Paternal Grandfather    Anesthesia problems Neg Hx    Hypotension Neg Hx    Malignant hyperthermia Neg Hx    Pseudochol deficiency Neg Hx     Past medical history, surgical history, medications, allergies, family history and social history reviewed with patient today and changes made to appropriate areas of the chart.   All ROS negative except what is listed above and in the HPI.      Objective:    BP 137/88   Pulse 81   Resp 16   Wt 251 lb 3.2 oz (113.9 kg)   SpO2 99%   BMI 43.80 kg/m   Wt Readings from Last 3 Encounters:  08/30/20 251 lb 3.2 oz (113.9 kg)  10/22/19 241 lb 8 oz (109.5 kg)  12/25/18 248 lb 3.2 oz (112.6 kg)    Physical Exam Vitals reviewed.  Constitutional:      Appearance: Normal appearance. She is obese.  HENT:     Head: Normocephalic and  atraumatic.     Right Ear: Tympanic membrane normal.     Left Ear: Tympanic membrane normal.     Nose: Nose normal.     Mouth/Throat:     Mouth: Mucous membranes are moist.     Pharynx: Oropharynx is clear. No posterior oropharyngeal  erythema.     Tonsils: No tonsillar exudate or tonsillar abscesses. 2+ on the right. 2+ on the left.  Eyes:     Extraocular Movements: Extraocular movements intact.     Conjunctiva/sclera: Conjunctivae normal.     Pupils: Pupils are equal, round, and reactive to light.  Cardiovascular:     Rate and Rhythm: Normal rate and regular rhythm.     Pulses: Normal pulses.     Heart sounds: Normal heart sounds.  Pulmonary:     Effort: Pulmonary effort is normal.     Breath sounds: Normal breath sounds.  Abdominal:     General: Bowel sounds are normal.     Palpations: Abdomen is soft.     Tenderness: There is no abdominal tenderness. There is no right CVA tenderness, left CVA tenderness or guarding.  Genitourinary:    General: Normal vulva.  Musculoskeletal:        General: Normal range of motion.     Cervical back: Normal range of motion and neck supple. No tenderness.     Right lower leg: No edema.     Left lower leg: No edema.  Lymphadenopathy:     Cervical: No cervical adenopathy.  Skin:    General: Skin is warm and dry.     Capillary Refill: Capillary refill takes less than 2 seconds.  Neurological:     Mental Status: She is alert and oriented to person, place, and time.  Psychiatric:        Mood and Affect: Mood normal.        Behavior: Behavior normal.        Thought Content: Thought content normal.        Judgment: Judgment normal.  Breast exam per pt request: fibrous tissue, no masses, discharge, skin/nipple changes  Results for orders placed or performed in visit on 08/30/20  POCT URINALYSIS DIP (CLINITEK)  Result Value Ref Range   Color, UA yellow yellow   Clarity, UA clear clear   Glucose, UA negative negative mg/dL   Bilirubin, UA  negative negative   Ketones, POC UA negative negative mg/dL   Spec Grav, UA 1.025 1.010 - 1.025   Blood, UA negative negative   pH, UA 6.0 5.0 - 8.0   POC PROTEIN,UA negative negative, trace   Urobilinogen, UA 0.2 0.2 or 1.0 E.U./dL   Nitrite, UA Negative Negative   Leukocytes, UA Negative Negative  POCT urine pregnancy  Result Value Ref Range   Preg Test, Ur Negative Negative      Assessment & Plan:   Problem List Items Addressed This Visit       Musculoskeletal and Integument   Eczema   Relevant Medications   mometasone (ELOCON) 0.1 % cream     Other   Borderline personality disorder (HCC)   Relevant Medications   venlafaxine XR (EFFEXOR-XR) 75 MG 24 hr capsule   MDD (major depressive disorder), recurrent episode, severe (HCC)   Relevant Medications   venlafaxine XR (EFFEXOR-XR) 75 MG 24 hr capsule   Other Visit Diagnoses     Wellness examination    -  Primary   Relevant Orders   CBC   COMPLETE METABOLIC PANEL WITH GFR   TSH   Lipid panel   Screen for STD (sexually transmitted disease)       Relevant Orders   HIV Antibody (routine testing w rflx)   C. trachomatis/N. gonorrhoeae RNA   HSV 1/2 Ab (IgM), IFA w/rflx Titer   WET PREP FOR [place],  YEAST, CLUE   RPR   HSV(herpes simplex vrs) 1+2 ab-IgG   Encounter for hepatitis C screening test for low risk patient       Relevant Orders   Hepatitis C antibody   Cervical cancer screening       Relevant Orders   Cytology - PAP   Enlarged tonsils       Relevant Orders   Ambulatory referral to ENT   Polyuria       Relevant Orders   HgB A1c   POCT URINALYSIS DIP (CLINITEK) (Completed)   Attention deficit disorder, unspecified hyperactivity presence       Relevant Medications   lisdexamfetamine (VYVANSE) 30 MG capsule   Unprotected sexual intercourse       Relevant Orders   POCT urine pregnancy (Completed)       Doing well overall today. No acute concerns, but would like to update screenings. UA and UPT  negative in office. Refills for Effexor and mometasone cream sent in. Will try again to switch to Vyvanse and see if her insurance will cover it (she tried to start when living in [place] while seeing a psychiatrist, but medicaid didn't cover it. She has not been on Adderall because she does not like how it makes her feel. Will try sending in the Vyvanse now that she has new insurance). STD screenings ordered, no symptoms today. History of pre-diabetes and currently experiencing polyuria, polydipsia - adding A1c to routine labs. She has had enlarged tonsils for many years and would like an ENT referral now that she is back living in this area. Pap updated today. Health promotion and safety discussed as mentioned below.    LABORATORY TESTING:  - Health maintenance labs ordered today as discussed above.           CBC, CMP, LIPIDS          TSH           A1c  - STI testing: done today - Pap smear: done today ; negative 2019 - HIV screening: negative in 2019; ordered today - Hep C screening: ordered today - Syphilis screening: negative 2019; ordered today   IMMUNIZATIONS:   - Tdap: Tetanus vaccination status reviewed: last tetanus booster within 10 years. - Influenza: Postponed to flu season - Pneumovax: Not applicable - Prevnar: Not applicable - Shingrix vaccine: Not applicable - [id number]: Up to date  SCREENING: - Mammogram: Not applicable - Bone Density: Not applicable - Colonoscopy: Not applicable  - AAA Screening: Not applicable  -Hearing Test: Not applicable  -Spirometry: Not applicable  - Lung Cancer Screening: Not applicable    PATIENT COUNSELING:   Advised to take 1 mg of folate supplement per day if capable of pregnancy.   Sexuality: Discussed sexually transmitted diseases, partner selection, use of condoms, avoidance of unintended pregnancy, and contraceptive alternatives.   I discussed with the patient that most people either abstain from alcohol or drink within safe limits  (<=14/week and <=4 drinks/occasion for males, <=7/weeks and <= 3 drinks/occasion for females) and that the risk for alcohol disorders and other health effects rises proportionally with the number of drinks per week and how often a drinker exceeds daily limits.  Discussed cessation/primary prevention of drug use and availability of treatment for abuse.   Diet: Encouraged to adjust caloric intake to maintain or achieve ideal body weight, to reduce intake of dietary saturated fat and total fat, to limit sodium intake by avoiding high sodium foods and not adding table salt,  and to maintain adequate dietary potassium and calcium preferably from fresh fruits, vegetables, and low-fat dairy products. Encouraged vitamin D 1000 units and Calcium '1300mg'$  or 4 servings of dairy a day.  Emphasized the importance of regular exercise.  Injury prevention: Discussed safety belts, safety helmets, smoke detector, smoking near bedding or upholstery.   Dental health: Discussed importance of regular tooth brushing, flossing, and dental visits.  Follow up plan:  Return in about 4 weeks (around 09/27/2020) for vyvanse f/u, can be virtual.   Lovena Le B. Olevia Bowens, DNP, FNP-C

## 2020-08-30 ENCOUNTER — Ambulatory Visit (INDEPENDENT_AMBULATORY_CARE_PROVIDER_SITE_OTHER): Payer: Managed Care, Other (non HMO) | Admitting: Family Medicine

## 2020-08-30 ENCOUNTER — Other Ambulatory Visit: Payer: Self-pay

## 2020-08-30 ENCOUNTER — Other Ambulatory Visit (HOSPITAL_COMMUNITY)
Admission: RE | Admit: 2020-08-30 | Discharge: 2020-08-30 | Disposition: A | Discharge status: Home / Self Care | Payer: Managed Care, Other (non HMO) | Source: Ambulatory Visit | Attending: Osteopathic Medicine | Admitting: Osteopathic Medicine

## 2020-08-30 ENCOUNTER — Encounter: Payer: Self-pay | Admitting: Family Medicine

## 2020-08-30 VITALS — BP 137/88 | HR 81 | Resp 16 | Wt 251.2 lb

## 2020-08-30 DIAGNOSIS — F332 Major depressive disorder, recurrent severe without psychotic features: Secondary | ICD-10-CM | POA: Diagnosis not present

## 2020-08-30 DIAGNOSIS — F988 Other specified behavioral and emotional disorders with onset usually occurring in childhood and adolescence: Secondary | ICD-10-CM

## 2020-08-30 DIAGNOSIS — Z7251 High risk heterosexual behavior: Secondary | ICD-10-CM | POA: Diagnosis not present

## 2020-08-30 DIAGNOSIS — Z124 Encounter for screening for malignant neoplasm of cervix: Secondary | ICD-10-CM | POA: Insufficient documentation

## 2020-08-30 DIAGNOSIS — R3589 Other polyuria: Secondary | ICD-10-CM | POA: Diagnosis not present

## 2020-08-30 DIAGNOSIS — J351 Hypertrophy of tonsils: Secondary | ICD-10-CM

## 2020-08-30 DIAGNOSIS — F603 Borderline personality disorder: Secondary | ICD-10-CM

## 2020-08-30 DIAGNOSIS — D509 Iron deficiency anemia, unspecified: Secondary | ICD-10-CM

## 2020-08-30 DIAGNOSIS — Z113 Encounter for screening for infections with a predominantly sexual mode of transmission: Secondary | ICD-10-CM | POA: Diagnosis not present

## 2020-08-30 DIAGNOSIS — D649 Anemia, unspecified: Secondary | ICD-10-CM

## 2020-08-30 DIAGNOSIS — Z1159 Encounter for screening for other viral diseases: Secondary | ICD-10-CM

## 2020-08-30 DIAGNOSIS — L309 Dermatitis, unspecified: Secondary | ICD-10-CM

## 2020-08-30 DIAGNOSIS — Z0000 Encounter for general adult medical examination without abnormal findings: Secondary | ICD-10-CM | POA: Diagnosis not present

## 2020-08-30 LAB — POCT URINALYSIS DIP (CLINITEK)
Bilirubin, UA: NEGATIVE
Blood, UA: NEGATIVE
Glucose, UA: NEGATIVE mg/dL
Ketones, POC UA: NEGATIVE mg/dL
Leukocytes, UA: NEGATIVE
Nitrite, UA: NEGATIVE
POC PROTEIN,UA: NEGATIVE
Spec Grav, UA: 1.025 (ref 1.010–1.025)
Urobilinogen, UA: 0.2 E.U./dL
pH, UA: 6 (ref 5.0–8.0)

## 2020-08-30 LAB — POCT URINE PREGNANCY: Preg Test, Ur: NEGATIVE

## 2020-08-30 MED ORDER — LISDEXAMFETAMINE DIMESYLATE 30 MG PO CAPS
30.0000 mg | ORAL_CAPSULE | Freq: Every day | ORAL | 0 refills | Status: DC
Start: 2020-08-30 — End: 2020-09-29

## 2020-08-30 MED ORDER — MOMETASONE FUROATE 0.1 % EX CREA: 1.0000 "application " | TOPICAL_CREAM | Freq: Every day | CUTANEOUS | 0 refills | Status: DC

## 2020-08-30 MED ORDER — VENLAFAXINE HCL ER 75 MG PO CP24: 75.0000 mg | ORAL_CAPSULE | Freq: Every day | ORAL | 2 refills | Status: DC

## 2020-08-31 NOTE — Progress Notes (Signed)
Will you see if the lab can add on the iron studies I just placed?  Labs looking good, but anemic - adding on iron studies since it has been awhile since we last checked. More detailed message sent to MyChart

## 2020-08-31 NOTE — Addendum Note (Signed)
Addended by: Caleen Jobs B on: 08/31/2020 11:19 AM   Modules accepted: Orders

## 2020-09-01 LAB — RPR

## 2020-09-01 LAB — WET PREP FOR TRICH, YEAST, CLUE
MICRO NUMBER:: 12163613
Specimen Quality: ADEQUATE

## 2020-09-01 LAB — HIV ANTIBODY (ROUTINE TESTING W REFLEX)

## 2020-09-01 LAB — TSH

## 2020-09-01 LAB — COMPLETE METABOLIC PANEL WITH GFR

## 2020-09-01 LAB — C. TRACHOMATIS/N. GONORRHOEAE RNA
C. trachomatis RNA, TMA: NOT DETECTED
N. gonorrhoeae RNA, TMA: NOT DETECTED

## 2020-09-01 LAB — LIPID PANEL

## 2020-09-01 LAB — HEMOGLOBIN A1C

## 2020-09-01 LAB — HSV(HERPES SIMPLEX VRS) I + II AB-IGG

## 2020-09-01 LAB — HEPATITIS C ANTIBODY

## 2020-09-01 LAB — HSV 1/2 AB (IGM), IFA W/RFLX TITER

## 2020-09-01 LAB — CBC

## 2020-09-02 LAB — CYTOLOGY - PAP
Comment: NEGATIVE
Diagnosis: NEGATIVE
High risk HPV: NEGATIVE

## 2020-09-02 MED ORDER — FERROUS SULFATE 325 (65 FE) MG PO TBEC: 325.0000 mg | DELAYED_RELEASE_TABLET | ORAL | 11 refills | Status: DC

## 2020-09-02 NOTE — Addendum Note (Signed)
Addended by: Caleen Jobs B on: 09/02/2020 08:52 AM   Modules accepted: Orders

## 2020-09-02 NOTE — Progress Notes (Signed)
MyChart message sent: Your pap smear was normal and negative for HPV. We do not have to repeat this for another 5 years!

## 2020-09-02 NOTE — Progress Notes (Signed)
Your iron levels were low. I'm going to send in a prescription for iron pills for you to take once every other day. We will need to recheck labs in about 2-3 weeks. Please schedule a follow-up appointment so we can discuss tolerance and any other concerns you may have at that time.

## 2020-09-08 LAB — LIPID PANEL
Cholesterol: 151 mg/dL (ref <200)
HDL: 46 mg/dL — ABNORMAL LOW (ref >=50)
LDL Cholesterol (Calc): 91 mg/dL (calc)
Non-HDL Cholesterol (Calc): 105 mg/dL (calc) (ref <130)
Total CHOL/HDL Ratio: 3.3 (calc) (ref <5.0)
Triglycerides: 46 mg/dL (ref <150)

## 2020-09-08 LAB — CBC
HCT: 33.5 % — ABNORMAL LOW (ref 35.0–45.0)
Hemoglobin: 10.3 g/dL — ABNORMAL LOW (ref 11.7–15.5)
MCH: 23.5 pg — ABNORMAL LOW (ref 27.0–33.0)
MCHC: 30.7 g/dL — ABNORMAL LOW (ref 32.0–36.0)
MCV: 76.3 fL — ABNORMAL LOW (ref 80.0–100.0)
MPV: 11.1 fL (ref 7.5–12.5)
Platelets: 300 10*3/uL (ref 140–400)
RBC: 4.39 10*6/uL (ref 3.80–5.10)
RDW: 14.5 % (ref 11.0–15.0)
WBC: 6.5 10*3/uL (ref 3.8–10.8)

## 2020-09-08 LAB — HEMOGLOBIN A1C
Hgb A1c MFr Bld: 5.5 % of total Hgb (ref <5.7)
Mean Plasma Glucose: 111 mg/dL
eAG (mmol/L): 6.2 mmol/L

## 2020-09-08 LAB — IRON,TIBC AND FERRITIN PANEL
%SAT: 9 % (calc) — ABNORMAL LOW (ref 16–45)
Ferritin: 19 ng/mL (ref 16–154)
Iron: 28 ug/dL — ABNORMAL LOW (ref 40–190)
TIBC: 316 mcg/dL (calc) (ref 250–450)

## 2020-09-08 LAB — HEPATITIS C ANTIBODY
Hepatitis C Ab: NONREACTIVE
SIGNAL TO CUT-OFF: 0.02 (ref <1.00)

## 2020-09-08 LAB — HSV 1/2 AB (IGM), IFA W/RFLX TITER
HSV 1 IgM Screen: NEGATIVE
HSV 2 IgM Screen: NEGATIVE

## 2020-09-08 LAB — HSV(HERPES SIMPLEX VRS) I + II AB-IGG
HAV 1 IGG,TYPE SPECIFIC AB: <0.9 index
HSV 2 IGG,TYPE SPECIFIC AB: <0.9 index

## 2020-09-08 LAB — COMPLETE METABOLIC PANEL WITH GFR
AG Ratio: 1 (calc) (ref 1.0–2.5)
ALT: 13 U/L (ref 6–29)
AST: 13 U/L (ref 10–30)
Albumin: 3.8 g/dL (ref 3.6–5.1)
Alkaline phosphatase (APISO): 74 U/L (ref 31–125)
BUN: 9 mg/dL (ref 7–25)
CO2: 26 mmol/L (ref 20–32)
Calcium: 8.8 mg/dL (ref 8.6–10.2)
Chloride: 106 mmol/L (ref 98–110)
Creat: 0.8 mg/dL (ref 0.50–0.97)
Globulin: 3.8 g/dL (calc) — ABNORMAL HIGH (ref 1.9–3.7)
Glucose, Bld: 89 mg/dL (ref 65–99)
Potassium: 4.2 mmol/L (ref 3.5–5.3)
Sodium: 139 mmol/L (ref 135–146)
Total Bilirubin: 0.3 mg/dL (ref 0.2–1.2)
Total Protein: 7.6 g/dL (ref 6.1–8.1)
eGFR: 102 mL/min/{1.73_m2} (ref >=60)

## 2020-09-08 LAB — TEST AUTHORIZATION

## 2020-09-08 LAB — RPR: RPR Ser Ql: NONREACTIVE

## 2020-09-08 LAB — HIV ANTIBODY (ROUTINE TESTING W REFLEX): HIV 1&2 Ab, 4th Generation: NONREACTIVE

## 2020-09-08 LAB — TSH: TSH: 2 mIU/L

## 2020-09-09 ENCOUNTER — Encounter: Payer: Self-pay | Admitting: Osteopathic Medicine

## 2020-09-09 ENCOUNTER — Ambulatory Visit (INDEPENDENT_AMBULATORY_CARE_PROVIDER_SITE_OTHER): Payer: Managed Care, Other (non HMO) | Admitting: Osteopathic Medicine

## 2020-09-09 VITALS — BP 138/75 | HR 106 | Ht 63.5 in | Wt 244.0 lb

## 2020-09-09 DIAGNOSIS — Z30430 Encounter for insertion of intrauterine contraceptive device: Secondary | ICD-10-CM
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
## 2020-09-12 ENCOUNTER — Ambulatory Visit: Payer: Self-pay | Admitting: Osteopathic Medicine

## 2020-09-27 ENCOUNTER — Emergency Department: Admission: EM | Admit: 2020-09-27 | Discharge: 2020-09-27 | Payer: Self-pay

## 2020-09-27 ENCOUNTER — Telehealth: Payer: Self-pay | Admitting: Osteopathic Medicine

## 2020-09-29 ENCOUNTER — Telehealth: Payer: Self-pay | Admitting: Osteopathic Medicine

## 2020-09-29 DIAGNOSIS — F988 Other specified behavioral and emotional disorders with onset usually occurring in childhood and adolescence: Secondary | ICD-10-CM

## 2020-09-29 MED ORDER — LISDEXAMFETAMINE DIMESYLATE 30 MG PO CAPS: 30.0000 mg | ORAL_CAPSULE | Freq: Every day | ORAL | 0 refills | Status: DC

## 2020-09-29 NOTE — Telephone Encounter (Signed)
Patient only has 2 pills left and next appointment isn't until 9/2. Would like more till it her next appointment since it is working for her. Medication is lisdexamfetamine (VYVANSE) 30 MG capsule [id number]

## 2020-09-30 ENCOUNTER — Other Ambulatory Visit: Payer: Self-pay

## 2020-09-30 ENCOUNTER — Ambulatory Visit: Payer: Medicaid Other | Admitting: Osteopathic Medicine

## 2020-09-30 ENCOUNTER — Telehealth: Payer: Self-pay | Admitting: Osteopathic Medicine

## 2020-09-30 DIAGNOSIS — F988 Other specified behavioral and emotional disorders with onset usually occurring in childhood and adolescence: Secondary | ICD-10-CM

## 2020-09-30 MED ORDER — LISDEXAMFETAMINE DIMESYLATE 30 MG PO CAPS: 30.0000 mg | ORAL_CAPSULE | Freq: Every day | ORAL | 0 refills | Status: DC

## 2020-09-30 NOTE — Telephone Encounter (Signed)
This has already been done.

## 2020-09-30 NOTE — Telephone Encounter (Signed)
Vyvanse went to the wrong pharmacy. Pended prescription and pharmacy.

## 2020-09-30 NOTE — Telephone Encounter (Signed)
Patient wanted medication switched to a different location and I told patient we would let pcp know and it would be done some time today. Patient called and stated shes at the pharmacy and they dont have it.

## 2020-10-03 ENCOUNTER — Encounter: Payer: Self-pay | Admitting: Osteopathic Medicine

## 2020-10-07 ENCOUNTER — Ambulatory Visit (INDEPENDENT_AMBULATORY_CARE_PROVIDER_SITE_OTHER): Payer: Managed Care, Other (non HMO) | Admitting: Osteopathic Medicine

## 2020-10-07 ENCOUNTER — Encounter: Payer: Self-pay | Admitting: Osteopathic Medicine

## 2020-10-07 ENCOUNTER — Other Ambulatory Visit: Payer: Self-pay

## 2020-10-07 VITALS — BP 141/84 | HR 76 | Temp 97.6°F | Wt 242.1 lb

## 2020-10-07 DIAGNOSIS — Z309 Encounter for contraceptive management, unspecified: Secondary | ICD-10-CM | POA: Diagnosis not present

## 2020-10-07 DIAGNOSIS — F988 Other specified behavioral and emotional disorders with onset usually occurring in childhood and adolescence: Secondary | ICD-10-CM

## 2020-10-07 DIAGNOSIS — N923 Ovulation bleeding: Secondary | ICD-10-CM

## 2020-10-07 DIAGNOSIS — Z30431 Encounter for routine checking of intrauterine contraceptive device: Secondary | ICD-10-CM | POA: Diagnosis not present

## 2020-10-07 DIAGNOSIS — F332 Major depressive disorder, recurrent severe without psychotic features: Secondary | ICD-10-CM | POA: Diagnosis not present

## 2020-10-07 DIAGNOSIS — F603 Borderline personality disorder: Secondary | ICD-10-CM

## 2020-10-07 LAB — POCT URINE PREGNANCY: Preg Test, Ur: NEGATIVE

## 2020-10-07 MED ORDER — VENLAFAXINE HCL ER 75 MG PO CP24: 75.0000 mg | ORAL_CAPSULE | Freq: Every day | ORAL | 3 refills | Status: DC

## 2020-10-07 MED ORDER — LISDEXAMFETAMINE DIMESYLATE 30 MG PO CAPS: 30.0000 mg | ORAL_CAPSULE | Freq: Every day | ORAL | 0 refills | Status: DC
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
## 2020-11-02 ENCOUNTER — Encounter: Payer: Self-pay | Admitting: Medical-Surgical

## 2020-11-07 ENCOUNTER — Ambulatory Visit: Payer: Managed Care, Other (non HMO) | Admitting: Family Medicine

## 2020-12-07 ENCOUNTER — Encounter: Payer: Self-pay | Admitting: Medical-Surgical

## 2020-12-07 ENCOUNTER — Ambulatory Visit (INDEPENDENT_AMBULATORY_CARE_PROVIDER_SITE_OTHER): Payer: Managed Care, Other (non HMO) | Admitting: Medical-Surgical

## 2020-12-07 VITALS — BP 131/79 | HR 98 | Resp 20 | Ht 63.5 in | Wt 242.0 lb

## 2020-12-07 DIAGNOSIS — F603 Borderline personality disorder: Secondary | ICD-10-CM

## 2020-12-07 DIAGNOSIS — F332 Major depressive disorder, recurrent severe without psychotic features: Secondary | ICD-10-CM | POA: Diagnosis not present

## 2020-12-07 DIAGNOSIS — F4310 Post-traumatic stress disorder, unspecified: Secondary | ICD-10-CM

## 2020-12-07 DIAGNOSIS — F988 Other specified behavioral and emotional disorders with onset usually occurring in childhood and adolescence: Secondary | ICD-10-CM

## 2020-12-07 DIAGNOSIS — Z62810 Personal history of physical and sexual abuse in childhood: Secondary | ICD-10-CM

## 2020-12-07 MED ORDER — VENLAFAXINE HCL ER 37.5 MG PO CP24: 37.5000 mg | ORAL_CAPSULE | Freq: Every day | ORAL | 1 refills | Status: DC
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
## 2020-12-12 ENCOUNTER — Encounter: Payer: Self-pay | Admitting: Medical-Surgical

## 2021-01-09 ENCOUNTER — Encounter: Payer: Self-pay | Admitting: Medical-Surgical

## 2021-01-09 ENCOUNTER — Ambulatory Visit (INDEPENDENT_AMBULATORY_CARE_PROVIDER_SITE_OTHER): Payer: Managed Care, Other (non HMO) | Admitting: Medical-Surgical

## 2021-01-09 ENCOUNTER — Other Ambulatory Visit: Payer: Self-pay

## 2021-01-09 VITALS — BP 129/89 | HR 86 | Resp 20 | Ht 63.5 in | Wt 234.0 lb

## 2021-01-09 DIAGNOSIS — F988 Other specified behavioral and emotional disorders with onset usually occurring in childhood and adolescence: Secondary | ICD-10-CM

## 2021-01-09 DIAGNOSIS — Z30432 Encounter for removal of intrauterine contraceptive device: Secondary | ICD-10-CM

## 2021-01-09 DIAGNOSIS — F332 Major depressive disorder, recurrent severe without psychotic features: Secondary | ICD-10-CM

## 2021-01-09 DIAGNOSIS — F603 Borderline personality disorder: Secondary | ICD-10-CM

## 2021-01-09 MED ORDER — VENLAFAXINE HCL ER 37.5 MG PO CP24: 37.5000 mg | ORAL_CAPSULE | Freq: Every day | ORAL | 1 refills | Status: DC

## 2021-01-09 MED ORDER — LISDEXAMFETAMINE DIMESYLATE 30 MG PO CAPS: 30.0000 mg | ORAL_CAPSULE | Freq: Every day | ORAL | 0 refills | Status: DC

## 2021-01-09 NOTE — Progress Notes (Signed)
  HPI with pertinent ROS:   CC: ADHD follow up, IUD removal?  HPI: Pleasant 30 year old female presenting today for ADHD follow-up and IUD removal.  She is taking Vyvanse 30 mg daily, tolerating well without side effects.  No change in appetite, weight, or sleeping patterns.  Feels the medication is helpful but ultimately would like to be on the lowest dose possible.  For now, she would like to wait until her body settles after having the IUD removed.  She has the [place] IUD in place and feels like her mood and attention suffered after having it put in.  She is not sexually active and is not concerned about birth control at this point.  She would like to have the IUD removed today.  Mood-ultimately, she would like to reduce her dose of Effexor to be on the lowest dose possible.  She did not start taking the extra 37.5 mg dose so she has been taking 75 mg daily.  She would like to reduce her dose to 37.5 mg daily to see if this is equally effective now that her IUD is out.  I reviewed the past medical history, family history, social history, surgical history, and allergies today and no changes were needed.  Please see the problem list section below in epic for further details.   Physical exam:   General: Well Developed, well nourished, and in no acute distress.  Neuro: Alert and oriented x3.  HEENT: Normocephalic, atraumatic.  Skin: Warm and dry. Cardiac: Regular rate and rhythm, no murmurs rubs or gallops, no lower extremity edema.  Respiratory: Clear to auscultation bilaterally. Not using accessory muscles, speaking in full sentences. Pelvic exam: VULVA: normal appearing vulva with no masses, tenderness or lesions, VAGINA: vaginal discharge - bloody, CERVIX: normal appearing cervix without discharge or lesions, cervical discharge present - bloody, IUD strings visualized, exam chaperoned by Lynnea Ferrier, MA.   Impression and Recommendations:    1. Encounter for IUD  removal Kyleena IUD removed without difficulty.  Patient tolerated well.  Strings intact and IUD removed in its entirety in 1 piece.  Advised patient that should she become sexually active, she will need to utilize condoms with female partners and consider other forms of birth control.  2. Attention deficit disorder, unspecified hyperactivity presence Continue Vyvanse 30 mg daily.  We may need to consider decreasing this dose per patient request in a few months.  3. Severe episode of recurrent major depressive disorder, without psychotic features ([place]) 4. Borderline personality disorder ([place]) Recommend slow taper from 75mg  to 37.5 mg daily.  Every other day dosing with 75 mg / 37.5 mg for the next few weeks then reduce to 37.5 mg daily.  If symptoms worsen, increase back to 75 mg daily.  Return in about 3 months (around 04/09/2021) for ADHD follow up. ___________________________________________ Clearnce Sorrel, DNP, APRN, FNP-BC Primary Care and [place]

## 2021-01-18 ENCOUNTER — Telehealth (INDEPENDENT_AMBULATORY_CARE_PROVIDER_SITE_OTHER): Payer: Managed Care, Other (non HMO) | Admitting: Sports Medicine

## 2021-01-18 DIAGNOSIS — J111 Influenza due to unidentified influenza virus with other respiratory manifestations: Secondary | ICD-10-CM

## 2021-01-18 MED ORDER — OSELTAMIVIR PHOSPHATE 75 MG PO CAPS: 75.0000 mg | ORAL_CAPSULE | Freq: Two times a day (BID) | ORAL | 0 refills | Status: DC

## 2021-01-18 NOTE — Progress Notes (Signed)
° °  Virtual Visit via Telephone   I connected with  Barbette Hair  on 01/18/21 by telephone/telehealth and verified that I am speaking with the correct person using two identifiers.   I discussed the limitations, risks, security and privacy concerns of performing an evaluation and management service by telephone, including the higher likelihood of inaccurate diagnosis and treatment, and the availability of in person appointments.  We also discussed the likely need of an additional face to face encounter for complete and high quality delivery of care.  I also discussed with the patient that there may be a patient responsible charge related to this service. The patient expressed understanding and wishes to proceed.  Provider location is in medical facility. Patient location is at their home, different from provider location. People involved in care of the patient during this telehealth encounter were myself, my nurse/medical assistant, and my front office/scheduling team member.  Review of Systems: No fevers, chills, night sweats, weight loss, chest pain, or shortness of breath.   Objective Findings:    General: Speaking full sentences, no audible heavy breathing.  Sounds alert and appropriately interactive.    Independent interpretation of tests performed by another provider:   None.  Brief History, Exam, Impression, and Recommendations:    Influenza Is a very pleasant 30 year old female, she has had about 3 days of flu symptoms, fever up to 102, chills, body aches, lethargy, home COVID test was negative. Daughter also had a home COVID test due to similar symptoms, negative. She is taking TheraFlu, adding Tamiflu even though she is a day outside of the window, she should remain out of work until fever free without fever reducers for 24 hours.  I discussed the above assessment and treatment plan with the patient. The patient was provided an opportunity to ask questions and all were  answered. The patient agreed with the plan and demonstrated an understanding of the instructions.   The patient was advised to call back or seek an in-person evaluation if the symptoms worsen or if the condition fails to improve as anticipated.   I provided 30 minutes of verbal and non-verbal time during this encounter date, time was needed to gather information, review chart, records, communicate/coordinate with staff remotely, as well as complete documentation.   ___________________________________________ Gwen Her. Dianah Field, M.D., ABFM., CAQSM. Primary Care and Sports Medicine [facility]  Adjunct Professor of [employer]

## 2021-01-18 NOTE — Progress Notes (Signed)
Sunday started with a sore throat and headache. Monday symptoms progressed with fever up to 102, chills, body aches, lethargy. She took an at home COVID test on Monday which was negative. Her daughter is also sick and had a negative COVID test as well. She is taking Theraflu to combat symptoms.

## 2021-01-18 NOTE — Assessment & Plan Note (Signed)
Is a very pleasant 30 year old female, she has had about 3 days of flu symptoms, fever up to 102, chills, body aches, lethargy, home COVID test was negative. Daughter also had a home COVID test due to similar symptoms, negative. She is taking TheraFlu, adding Tamiflu even though she is a day outside of the window, she should remain out of work until fever free without fever reducers for 24 hours.

## 2021-02-05 DIAGNOSIS — D369 Benign neoplasm, unspecified site: Secondary | ICD-10-CM

## 2021-02-05 HISTORY — DX: Benign neoplasm, unspecified site: D36.9

## 2021-03-01 ENCOUNTER — Telehealth: Payer: Self-pay | Admitting: Medical-Surgical

## 2021-03-01 ENCOUNTER — Other Ambulatory Visit: Payer: Self-pay

## 2021-03-01 DIAGNOSIS — F988 Other specified behavioral and emotional disorders with onset usually occurring in childhood and adolescence: Secondary | ICD-10-CM

## 2021-03-01 MED ORDER — LISDEXAMFETAMINE DIMESYLATE 30 MG PO CAPS: 30.0000 mg | ORAL_CAPSULE | Freq: Every day | ORAL | 0 refills | Status: DC

## 2021-03-01 NOTE — Telephone Encounter (Signed)
New prescriptions pended and sent to provider.

## 2021-03-01 NOTE — Progress Notes (Signed)
Prescriptions sent to your pharmacy as requested.  Please make sure these are canceled at her old pharmacy.  ___________________________________________ Clearnce Sorrel, DNP, APRN, FNP-BC Primary Care and Sports Medicine [place]

## 2021-03-01 NOTE — Progress Notes (Signed)
[facility] [place] [facility] at [facility].

## 2021-03-01 NOTE — Telephone Encounter (Signed)
Patient states she has been requesting her pharmacy to be updated to [place] and take [facility] [place] because she no longer lives there.  Her Vyvanse was sent to [facility] and she needs it sent to [facility]. Please resend script to [facility]

## 2021-03-13 ENCOUNTER — Encounter: Payer: Self-pay | Admitting: Medical-Surgical

## 2021-03-14 ENCOUNTER — Telehealth (INDEPENDENT_AMBULATORY_CARE_PROVIDER_SITE_OTHER): Payer: Managed Care, Other (non HMO) | Admitting: Medical-Surgical

## 2021-03-14 ENCOUNTER — Encounter: Payer: Self-pay | Admitting: Medical-Surgical

## 2021-03-14 DIAGNOSIS — F4310 Post-traumatic stress disorder, unspecified: Secondary | ICD-10-CM | POA: Diagnosis not present

## 2021-03-14 DIAGNOSIS — F332 Major depressive disorder, recurrent severe without psychotic features: Secondary | ICD-10-CM | POA: Diagnosis not present

## 2021-03-14 DIAGNOSIS — F603 Borderline personality disorder: Secondary | ICD-10-CM

## 2021-03-14 DIAGNOSIS — F909 Attention-deficit hyperactivity disorder, unspecified type: Secondary | ICD-10-CM

## 2021-03-14 MED ORDER — VENLAFAXINE HCL ER 37.5 MG PO CP24: 37.5000 mg | ORAL_CAPSULE | Freq: Every day | ORAL | 1 refills | Status: DC

## 2021-03-14 NOTE — Progress Notes (Signed)
Virtual Visit via Video Note  I connected with [place] on 03/14/21 at  1:00 PM EST by a video enabled telemedicine application and verified that I am speaking with the correct person using two identifiers.   I discussed the limitations of evaluation and management by telemedicine and the availability of in person appointments. The patient expressed understanding and agreed to proceed.  Patient location: home Provider locations: office  Subjective:    CC: Form completion  HPI: Pleasant 31 year old female presenting today via [place] video visit to discuss paperwork that she sent attached to a MyChart message.  Notes that she is financially struggling to afford her medications at this point and has sent a form over for financial assistance with the cost of Vyvanse.  Reports that she does meet the criteria to receive patient assistance but needs the provider part filled out.  Also has an accommodations form for her job.  She would like this completed to reflect her issues with ADHD as well as mood.  She is struggling to complete her work as instructed in her current environment and struggles greatly with time management.  Also has difficulty with sensory overload when trying to perform complex tasks.  Reports that she needs privacy or closed in space for a limitation and interruptions as well as distractions.  Notes that her employer has a small room that has a door that can be closed.  She would like ultimately like to have this room designated as an office for her.  She reports that the door being closed is not ideal per her employers preference but she has ideas that she would be able to put a sign on the door to help allay worries about accessibility.  Is currently taking Effexor 37.5 mg daily.  Was previously trying to reduce her dose to the lowest effective dose however she ran out after a while and had to substitute 75 mg capsules.  She has been using these off and on but would like to  have the refill of the 37.5 mg capsules since she felt that this was the lowest effective dose taken on a daily basis.  She does not want to completely come off the medication at this point however she would like to at least reduce it.   Past medical history, Surgical history, Family history not pertinant except as noted below, Social history, Allergies, and medications have been entered into the medical record, reviewed, and corrections made.   Review of Systems: See HPI for pertinent positives and negatives.   Objective:    General: Speaking clearly in complete sentences without any shortness of breath.  Alert and oriented x3.  Normal judgment. No apparent acute distress.  Impression and Recommendations:    1. Attention deficit hyperactivity disorder (ADHD), unspecified ADHD type Patient assistance forms printed and completed.  There is a section that has not been completed for patient information.  She reports that she has a copy of her own and will complete her part of the inflammation.  Once this is done, she will drop it by our office so that we can fax it.  Reviewed her accommodation request.  Advised her that the accommodations can be recommended by medical professional but her employer ultimately will have the decision if they are put into place.  Forms completed and will be faxed along with her other paperwork.  2. Severe episode of recurrent major depressive disorder, without psychotic features ([place]) 3. Borderline personality disorder ([place]) 4. PTSD (post-traumatic stress  disorder) Refilling Effexor at 37.5 mg daily.   I discussed the assessment and treatment plan with the patient. The patient was provided an opportunity to ask questions and all were answered. The patient agreed with the plan and demonstrated an understanding of the instructions.   The patient was advised to call back or seek an in-person evaluation if the symptoms worsen or if the condition fails to improve as  anticipated.  25 minutes of non-face-to-face time was provided during this encounter.  Return if symptoms worsen or fail to improve.  Clearnce Sorrel, DNP, APRN, FNP-BC [facility] and Sports Medicine

## 2021-03-14 NOTE — Progress Notes (Signed)
Requesting accomodation forms be filled out for her ADHD.

## 2021-03-21 ENCOUNTER — Encounter: Payer: Self-pay | Admitting: Medical-Surgical

## 2021-04-01 ENCOUNTER — Other Ambulatory Visit: Payer: Self-pay | Admitting: Medical-Surgical

## 2021-04-01 DIAGNOSIS — F988 Other specified behavioral and emotional disorders with onset usually occurring in childhood and adolescence: Secondary | ICD-10-CM

## 2021-04-10 ENCOUNTER — Ambulatory Visit: Payer: Managed Care, Other (non HMO) | Admitting: Medical-Surgical

## 2021-04-12 ENCOUNTER — Encounter: Payer: Self-pay | Admitting: Medical-Surgical

## 2021-04-18 NOTE — Telephone Encounter (Signed)
Last read by Barbette Hair "[name]" at  9:38 PM on 04/12/2021.  Pt has not responded.  Encounter closed.  Charyl Bigger, CMA ?

## 2021-05-09 ENCOUNTER — Ambulatory Visit (INDEPENDENT_AMBULATORY_CARE_PROVIDER_SITE_OTHER): Payer: Managed Care, Other (non HMO) | Admitting: Medical-Surgical

## 2021-05-09 ENCOUNTER — Encounter: Payer: Self-pay | Admitting: Medical-Surgical

## 2021-05-09 VITALS — BP 138/84 | HR 97 | Resp 20 | Ht 63.5 in | Wt 234.0 lb

## 2021-05-09 DIAGNOSIS — F332 Major depressive disorder, recurrent severe without psychotic features: Secondary | ICD-10-CM | POA: Diagnosis not present

## 2021-05-09 DIAGNOSIS — Z113 Encounter for screening for infections with a predominantly sexual mode of transmission: Secondary | ICD-10-CM | POA: Diagnosis not present

## 2021-05-09 DIAGNOSIS — F603 Borderline personality disorder: Secondary | ICD-10-CM

## 2021-05-09 DIAGNOSIS — L089 Local infection of the skin and subcutaneous tissue, unspecified: Secondary | ICD-10-CM | POA: Diagnosis not present

## 2021-05-09 DIAGNOSIS — R8290 Unspecified abnormal findings in urine: Secondary | ICD-10-CM

## 2021-05-09 DIAGNOSIS — K148 Other diseases of tongue: Secondary | ICD-10-CM

## 2021-05-09 DIAGNOSIS — F909 Attention-deficit hyperactivity disorder, unspecified type: Secondary | ICD-10-CM

## 2021-05-09 DIAGNOSIS — B9689 Other specified bacterial agents as the cause of diseases classified elsewhere: Secondary | ICD-10-CM

## 2021-05-09 LAB — POCT URINALYSIS DIP (CLINITEK)
Bilirubin, UA: NEGATIVE
Blood, UA: NEGATIVE
Glucose, UA: NEGATIVE mg/dL
Ketones, POC UA: NEGATIVE mg/dL
Leukocytes, UA: NEGATIVE
Nitrite, UA: NEGATIVE
POC PROTEIN,UA: NEGATIVE
Spec Grav, UA: >=1.03 — AB (ref 1.010–1.025)
Urobilinogen, UA: 0.2 E.U./dL
pH, UA: 7 (ref 5.0–8.0)

## 2021-05-09 MED ORDER — DOXYCYCLINE HYCLATE 100 MG PO TABS: 100.0000 mg | ORAL_TABLET | Freq: Two times a day (BID) | ORAL | 0 refills | Status: AC

## 2021-05-09 MED ORDER — MUPIROCIN 2 % EX OINT: TOPICAL_OINTMENT | CUTANEOUS | 0 refills | Status: DC

## 2021-05-09 NOTE — Progress Notes (Signed)
?HPI with pertinent ROS:  ? ?CC: multiple concerns ? ?HPI: ?Pleasant 31 year old female presenting today for the following: ? ?UTI- history of UTIs and worries about the possibility of having another. Wants her urine checked to day although there are no current significant symptoms. Does have some bladder "stress" intermittently.  ? ?Requesting testing for BV/yeast and STI testing. Has recurrent BV, uses boric acid suppositories. Has not used them in several days so testing will be accurate.  ? ?Has a large mole on her left eyebrow. It has been there since she was very little and involves her eyebrow. She hasn't had it removed because she is scared that she will lose half her eyebrow. About once yearly, she develops a "cyst" right next to it that swells for about 3-4 days then "erupts" and drains pus for a few days before resolving on it's own. Has not specifically treated before and doesn't know what causes it. Thinks she may have hit the mole or scratched it somehow. Is going on a trip next week and wants to get it cleared up ASAP.  ? ?ADHD- taking Vyvanse '30mg'$  daily, tolerating well. Thinks she is unproductive and can't focus without the medication but when she takes it she gets hyperfocused on her job. Is not receptive to dropping the dose to '20mg'$  daily to see if this helps.  ? ?Mood- taking Effexor 37.'5mg'$  daily as prescribed. Notes that she feels like she is functionally depressed. Had a bout of severe depression immediately before her last menstrual period that involved SI. Once her bleeding started, her depression/SI resolved. Notes her anxiety has gotten better since having her IUD removed. She still fidgets a lot but the teeth grinding and such has improved.  ? ?Has blue discoloration on her tongue. Unsure how long it has been there. Could be a few months or as long as a year. Otherwise, completely asymptomatic. No recent intake with blue dye.  ? ?I reviewed the past medical history, family history, social  history, surgical history, and allergies today and no changes were needed.  Please see the problem list section below in epic for further details. ? ? ?Physical exam:  ? ?General: Well Developed, well nourished, and in no acute distress.  ?Neuro: Alert and oriented x3, extra-ocular muscles intact, sensation grossly intact.  ?HEENT: Normocephalic, atraumatic, pupils equal round reactive to light. Scatter bluish discoloration to the dorsal aspect of the tongue without swelling, erythema, or other lesions.  ?Skin: Warm and dry. Approximately 0.5cm round dark brown mole involving the medial left eyebrown. Pustule with crusted yellowish green drainage to the lateral border of the mole. No erythema or surrounding cellulitic type changes.  ?Cardiac: Regular rate and rhythm, no murmurs rubs or gallops, no lower extremity edema.  ?Respiratory: Clear to auscultation bilaterally. Not using accessory muscles, speaking in full sentences. ? ?Impression and Recommendations:   ? ?1. Abnormal urine odor ?POCT UA negative aside from elevated specific gravity. Sending for culture.  ?- POCT URINALYSIS DIP (CLINITEK) ?- Urine Culture ? ?2. Screen for STD (sexually transmitted disease) ?Stat wet prep. Checking urine for gonorrhea and chlamydia.  ?- WET PREP FOR TRICH, YEAST, CLUE ?- C. trachomatis/N. gonorrhoeae RNA ? ?3. Bacterial skin infection ?Likely a secondary bacterial infection from trauma to the large mole on her eyebrow. Will go ahead and treat with Doxycycline BID x 7 days. Adding Mupirocin BID to the affected area. Consider removal of the mole to address root cause of recurring infections.  ? ?4. Severe episode  of recurrent major depressive disorder, without psychotic features ([place]) ?5. Borderline personality disorder ([place]) ?Continue Effexor 37.'5mg'$  daily.  ? ?6. Attention deficit hyperactivity disorder (ADHD), unspecified ADHD type ?Continue Vyvanse '30mg'$  daily.  ? ?7. Tongue discoloration ?Unclear etiology. In the setting of  no other associated symptoms, recommend continued monitoring and if changes noted, return for further evaluation.  ? ?Return in about 3 months (around 08/08/2021) for ADHD follow up. ?___________________________________________ ?Clearnce Sorrel, DNP, APRN, FNP-BC ?Primary Care and Sports Medicine ?[place] ?

## 2021-05-10 LAB — WET PREP FOR TRICH, YEAST, CLUE
MICRO NUMBER:: 13220345
Specimen Quality: ADEQUATE

## 2021-05-10 LAB — C. TRACHOMATIS/N. GONORRHOEAE RNA
C. trachomatis RNA, TMA: NOT DETECTED
N. gonorrhoeae RNA, TMA: NOT DETECTED

## 2021-05-11 LAB — URINE CULTURE
MICRO NUMBER:: 13223688
Result:: NO GROWTH
SPECIMEN QUALITY:: ADEQUATE

## 2021-06-02 ENCOUNTER — Other Ambulatory Visit: Payer: Self-pay | Admitting: Medical-Surgical

## 2021-06-02 ENCOUNTER — Telehealth: Payer: Self-pay | Admitting: Medical-Surgical

## 2021-06-02 DIAGNOSIS — F988 Other specified behavioral and emotional disorders with onset usually occurring in childhood and adolescence: Secondary | ICD-10-CM

## 2021-06-02 MED ORDER — LISDEXAMFETAMINE DIMESYLATE 30 MG PO CAPS: 30.0000 mg | ORAL_CAPSULE | Freq: Every day | ORAL | 0 refills | Status: DC

## 2021-06-02 NOTE — Telephone Encounter (Signed)
Patient called and requested a refill of Vyvanse to [facility] ?  [facility]  [facility], [place]  ?

## 2021-06-02 NOTE — Telephone Encounter (Signed)
Patient called and requested a refill of Vyvanse to be [facility] to: ? [facility]  [facility], [place]  ?

## 2021-06-13 ENCOUNTER — Telehealth: Payer: Managed Care, Other (non HMO) | Admitting: Family Medicine

## 2021-06-13 ENCOUNTER — Encounter: Payer: Self-pay | Admitting: Medical-Surgical

## 2021-06-13 DIAGNOSIS — B3731 Acute candidiasis of vulva and vagina: Secondary | ICD-10-CM | POA: Diagnosis not present

## 2021-06-13 MED ORDER — FLUCONAZOLE 150 MG PO TABS: ORAL_TABLET | ORAL | 0 refills | Status: DC

## 2021-06-13 NOTE — Patient Instructions (Signed)
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
## 2021-06-18 NOTE — Progress Notes (Signed)
Virtual Visit via Video Note ? ?I connected with Orvis Brill on 06/19/21 at 10:30 AM EDT by a video enabled telemedicine application and verified that I am speaking with the correct person using two identifiers. ?  ?I discussed the limitations of evaluation and management by telemedicine and the availability of in person appointments. The patient expressed understanding and agreed to proceed. ? ?Patient location: home ?Provider locations: office ? ?Subjective:   ? ?CC: feeling bad/tired every month ? ?HPI: ?Pleasant 31 year old female presenting via [place] video visit with the following: ? ?Notes that she feels tired and very bad every month just before her menstrual cycle starts.  This occurs for couple of days prior to starting and once she does begin her menses, she notes a resolution of her symptoms.  She has significant mood swings during this time as well as body aches, fatigue, and generalized malaise.  She is taking Effexor 75 mg daily and notes that this is not making much of a difference.  She is taking it at this dose for a couple of weeks after being on 37.5 mg for a while.  Notes that she previously took Effexor at 150 mg and it was just too much medicine for her. ? ?Reports that the Vyvanse at 30 mg daily is not working like it used to.  She did try breaking open the capsule to increase her dose which she noted worked fairly well.  She is interested in trying the increased dose but is not sure when she will be able to get this filled since she uses a program that allows her to get the Vyvanse at no cost. ? ?Past medical history, Surgical history, Family history not pertinant except as noted below, Social history, Allergies, and medications have been entered into the medical record, reviewed, and corrections made.  ? ?Review of Systems: See HPI for pertinent positives and negatives.  ? ?Objective:   ? ?General: Speaking clearly in complete sentences without any shortness of breath.  Alert and  oriented x3.  Normal judgment. No apparent acute distress. ? ?Impression and Recommendations:   ? ?1. Malaise and fatigue ?Presentation is consistent with PMDD however she does have underlying anxiety/depression issues.  Continue looking for a psychiatrist.  Offered referral but patient reports she is very picky about her psychiatrist and does not trust most of them so she would like to find her own.  For now, increase Effexor to 112.5 mg daily to see if this is a good middle-of-the-road between too much and not enough. ? ?2. Attention deficit disorder, unspecified hyperactivity presence ?Increasing Vyvanse to 40 mg daily to see if this is more beneficial. ?- lisdexamfetamine (VYVANSE) 40 MG capsule; Take 1 capsule (40 mg total) by mouth daily.  Dispense: 30 capsule; Refill: 0 ? ?I discussed the assessment and treatment plan with the patient. The patient was provided an opportunity to ask questions and all were answered. The patient agreed with the plan and demonstrated an understanding of the instructions. ?  ?The patient was advised to call back or seek an in-person evaluation if the symptoms worsen or if the condition fails to improve as anticipated. ? ?25 minutes of non-face-to-face time was provided during this encounter. ? ?Return in about 4 weeks (around 07/17/2021) for mood/ADHD follow up. ? ?Clearnce Sorrel, DNP, APRN, FNP-BC ?[place] ?[facility] ?

## 2021-06-19 ENCOUNTER — Encounter: Payer: Self-pay | Admitting: Medical-Surgical

## 2021-06-19 ENCOUNTER — Telehealth (INDEPENDENT_AMBULATORY_CARE_PROVIDER_SITE_OTHER): Payer: Managed Care, Other (non HMO) | Admitting: Medical-Surgical

## 2021-06-19 VITALS — Ht 63.5 in | Wt 230.0 lb

## 2021-06-19 DIAGNOSIS — R5383 Other fatigue: Secondary | ICD-10-CM

## 2021-06-19 DIAGNOSIS — F988 Other specified behavioral and emotional disorders with onset usually occurring in childhood and adolescence: Secondary | ICD-10-CM | POA: Diagnosis not present

## 2021-06-19 DIAGNOSIS — R5381 Other malaise: Secondary | ICD-10-CM | POA: Diagnosis not present

## 2021-06-19 MED ORDER — LISDEXAMFETAMINE DIMESYLATE 40 MG PO CAPS: 40.0000 mg | ORAL_CAPSULE | Freq: Every day | ORAL | 0 refills | Status: DC

## 2021-06-20 ENCOUNTER — Emergency Department (INDEPENDENT_AMBULATORY_CARE_PROVIDER_SITE_OTHER)
Admission: EM | Admit: 2021-06-20 | Discharge: 2021-06-20 | Disposition: A | Discharge status: Home / Self Care | Payer: Managed Care, Other (non HMO) | Source: Home / Self Care

## 2021-06-20 DIAGNOSIS — J029 Acute pharyngitis, unspecified: Secondary | ICD-10-CM | POA: Diagnosis not present

## 2021-06-20 DIAGNOSIS — R059 Cough, unspecified: Secondary | ICD-10-CM | POA: Diagnosis not present

## 2021-06-20 LAB — POCT RAPID STREP A (OFFICE): Rapid Strep A Screen: NEGATIVE

## 2021-06-20 MED ORDER — AZITHROMYCIN 250 MG PO TABS: 250.0000 mg | ORAL_TABLET | Freq: Every day | ORAL | 0 refills | Status: DC

## 2021-06-20 MED ORDER — PREDNISONE 20 MG PO TABS: ORAL_TABLET | ORAL | 0 refills | Status: DC

## 2021-06-20 NOTE — ED Triage Notes (Signed)
Pt presents with sore throat and cough that began on Saturday  ?

## 2021-06-20 NOTE — Discharge Instructions (Addendum)
Instructed patient to take medication as directed with food to completion.  Advised patient to start medications tomorrow morning Wednesday, 06/21/2021.  Thank you.  Advised patient to take prednisone with Zithromax daily for the next 5 days.  Encouraged patient to increase daily water intake while taking these medications. ?
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
## 2021-06-25 LAB — CULTURE, GROUP A STREP: Strep A Culture: NEGATIVE

## 2021-07-04 ENCOUNTER — Encounter: Payer: Self-pay | Admitting: Medical-Surgical

## 2021-07-19 ENCOUNTER — Ambulatory Visit
Admission: EM | Admit: 2021-07-19 | Discharge: 2021-07-19 | Disposition: A | Discharge status: Home / Self Care | Payer: Managed Care, Other (non HMO) | Attending: Urgent Care | Admitting: Urgent Care

## 2021-07-19 ENCOUNTER — Encounter: Payer: Self-pay | Admitting: Emergency Medicine

## 2021-07-19 DIAGNOSIS — H9202 Otalgia, left ear: Secondary | ICD-10-CM | POA: Diagnosis not present

## 2021-07-19 DIAGNOSIS — R0982 Postnasal drip: Secondary | ICD-10-CM | POA: Diagnosis present

## 2021-07-19 DIAGNOSIS — Z20818 Contact with and (suspected) exposure to other bacterial communicable diseases: Secondary | ICD-10-CM

## 2021-07-19 DIAGNOSIS — H6982 Other specified disorders of Eustachian tube, left ear: Secondary | ICD-10-CM | POA: Diagnosis not present

## 2021-07-19 DIAGNOSIS — R070 Pain in throat: Secondary | ICD-10-CM | POA: Insufficient documentation

## 2021-07-19 LAB — POCT RAPID STREP A (OFFICE): Rapid Strep A Screen: NEGATIVE

## 2021-07-19 MED ORDER — LEVOCETIRIZINE DIHYDROCHLORIDE 5 MG PO TABS
5.0000 mg | ORAL_TABLET | Freq: Every evening | ORAL | 0 refills | Status: DC
Start: 2021-07-19 — End: 2021-09-06

## 2021-07-19 MED ORDER — PSEUDOEPHEDRINE HCL 60 MG PO TABS: 60.0000 mg | ORAL_TABLET | Freq: Three times a day (TID) | ORAL | 0 refills | Status: DC | PRN

## 2021-07-19 MED ORDER — FLUTICASONE PROPIONATE 50 MCG/ACT NA SUSP: 2.0000 | Freq: Every day | NASAL | 12 refills | Status: DC

## 2021-07-19 NOTE — ED Provider Notes (Signed)
[facility]   MRN: [id number] DOB: May 23, 1990  Subjective:   Tasha Barrett is a 31 y.o. female presenting for 1 week history of persistent throat pain, painful swallowing. Has also had intermittent left ear pain.  Patient's daughter tested positive for strep throat last week and is currently completing treatment. Patient underwent a Z-pack and prednisone course for pharyngitis 06/20/2021. Rapid strep and throat culture was negative for that time frame. No cough, chest pain, shob.  No rashes.  No current facility-administered medications for this encounter.  Current Outpatient Medications:    azithromycin (ZITHROMAX) 250 MG tablet, Take 1 tablet (250 mg total) by mouth daily. Take first 2 tablets together, then 1 every day until finished., Disp: 6 tablet, Rfl: 0   fluconazole (DIFLUCAN) 150 MG tablet, Take one 150 mg tablet now and then may repeat in 3 days. (Patient not taking: Reported on 06/20/2021), Disp: 2 tablet, Rfl: 0   lisdexamfetamine (VYVANSE) 30 MG capsule, Take 1 capsule (30 mg total) by mouth daily., Disp: 30 capsule, Rfl: 0   lisdexamfetamine (VYVANSE) 40 MG capsule, Take 1 capsule (40 mg total) by mouth daily., Disp: 30 capsule, Rfl: 0   mometasone (ELOCON) 0.1 % cream, Apply 1 application topically daily. (Patient not taking: Reported on 06/20/2021), Disp: 45 g, Rfl: 0   mupirocin ointment (BACTROBAN) 2 %, Apply to affected area TID for 7 days., Disp: 30 g, Rfl: 0   predniSONE (DELTASONE) 20 MG tablet, Take 3 tabs PO daily x 5 days., Disp: 15 tablet, Rfl: 0   venlafaxine XR (EFFEXOR XR) 37.5 MG 24 hr capsule, Take 1 capsule (37.5 mg total) by mouth daily with breakfast., Disp: 30 capsule, Rfl: 1   Allergies  Allergen Reactions   Amoxicillin-Pot Clavulanate Other (See Comments)    As child - pt reports NO Hx anaphylaxis, thinks was hives   Latex Other (See Comments)    Possible yeast infection, after latex condom use    Past Medical History:   Diagnosis Date   ADHD    Anxiety    Used Latuda (did not work)   Bipolar 2 disorder ([place])    Bipolar disorder ([place])    Depression    Pneumonia    age 6    PTSD (post-traumatic stress disorder)    Yeast infection      Past Surgical History:  Procedure Laterality Date   BREAST REDUCTION SURGERY  01/2015   CESAREAN SECTION N/A 03/09/2014   Procedure: CESAREAN SECTION;  Surgeon: Donnamae Jude, MD;  Location: [facility];  Service: Obstetrics;  Laterality: N/A;    Family History  Problem Relation Age of Onset   Depression Mother    Gout Father    Hypertension Father    Gout Paternal Uncle    Gout Paternal Grandfather    Anesthesia problems Neg Hx    Hypotension Neg Hx    Malignant hyperthermia Neg Hx    Pseudochol deficiency Neg Hx     Social History   Tobacco Use   Smoking status: Never   Smokeless tobacco: Never  Vaping Use   Vaping Use: Some days   Substances: Mixture of cannabinoids  Substance Use Topics   Alcohol use: Not Currently   Drug use: Yes    Types: Marijuana    Comment: States 5-6 times/year; through vaping    ROS   Objective:   Vitals: BP (!) 141/84   Pulse 87   Temp 98.3 F (36.8 C) (Oral)  Resp 20   LMP 06/17/2021   SpO2 98%   Physical Exam Constitutional:      General: She is not in acute distress.    Appearance: Normal appearance. She is well-developed. She is not ill-appearing, toxic-appearing or diaphoretic.  HENT:     Head: Normocephalic and atraumatic.     Right Ear: Tympanic membrane, ear canal and external ear normal. No drainage, swelling or tenderness. No middle ear effusion. There is no impacted cerumen. Tympanic membrane is not injected, perforated, erythematous or bulging.     Left Ear: Tympanic membrane, ear canal and external ear normal. No drainage, swelling or tenderness.  No middle ear effusion. There is no impacted cerumen. Tympanic membrane is not injected, perforated, erythematous or bulging.     Nose: Nose normal.      Mouth/Throat:     Mouth: Mucous membranes are moist.     Pharynx: No pharyngeal swelling, oropharyngeal exudate, posterior oropharyngeal erythema or uvula swelling.     Tonsils: No tonsillar exudate or tonsillar abscesses. 0 on the right. 0 on the left.     Comments: Chronic tonsillar hypertrophy.  Significant postnasal drainage overlying pharynx and tonsils. Eyes:     General: No scleral icterus.       Right eye: No discharge.        Left eye: No discharge.     Extraocular Movements: Extraocular movements intact.  Cardiovascular:     Rate and Rhythm: Normal rate.  Pulmonary:     Effort: Pulmonary effort is normal.  Skin:    General: Skin is warm and dry.  Neurological:     General: No focal deficit present.     Mental Status: She is alert and oriented to person, place, and time.  Psychiatric:        Mood and Affect: Mood normal.        Behavior: Behavior normal.    Results for orders placed or performed during the hospital encounter of 07/19/21 (from the past 24 hour(s))  POCT rapid strep A     Status: None   Collection Time: 07/19/21  7:53 PM  Result Value Ref Range   Rapid Strep A Screen Negative Negative    Assessment and Plan :   PDMP not reviewed this encounter.  1. Post-nasal drainage   2. Throat pain   3. Strep throat exposure   4. Left ear pain   5. Eustachian tube dysfunction, left    Strep culture pending.  Discussed antibiotic stewardship.  Will defer further antibiotic use and steroid use.  Strep culture pending.  Recommended managing for recurrent sinus inflammation as it would happen with eustachian tube dysfunction, allergic rhinitis.  Recommended starting Flonase, Xyzal, pseudoephedrine. Counseled patient on potential for adverse effects with medications prescribed/recommended today, ER and return-to-clinic precautions discussed, patient verbalized understanding.    Jaynee Eagles, [place] 07/20/21 [phone]

## 2021-07-19 NOTE — ED Triage Notes (Signed)
Child had sore throat last week and pt presents with sore throat that has not improved with OTC meds for 1 week.

## 2021-07-22 LAB — CULTURE, GROUP A STREP (THRC)

## 2021-08-18 ENCOUNTER — Telehealth: Payer: Self-pay | Admitting: Medical-Surgical

## 2021-08-18 NOTE — Telephone Encounter (Signed)
FYI: Pt called.  She stated, I'm having a panic attack, I am trying hard not to lose it because I don't want to lose my job. I called my therapist and she's busy. I spoke to Joy about the patient's situation and she recommended that patient go to Urgent Care because she is booked up. When I gave patient the message she hung up.

## 2021-09-06 ENCOUNTER — Telehealth (INDEPENDENT_AMBULATORY_CARE_PROVIDER_SITE_OTHER): Payer: Managed Care, Other (non HMO) | Admitting: Medical-Surgical

## 2021-09-06 ENCOUNTER — Encounter: Payer: Self-pay | Admitting: Medical-Surgical

## 2021-09-06 DIAGNOSIS — F332 Major depressive disorder, recurrent severe without psychotic features: Secondary | ICD-10-CM

## 2021-09-06 DIAGNOSIS — Z0289 Encounter for other administrative examinations: Secondary | ICD-10-CM

## 2021-09-06 DIAGNOSIS — F909 Attention-deficit hyperactivity disorder, unspecified type: Secondary | ICD-10-CM

## 2021-09-06 DIAGNOSIS — F603 Borderline personality disorder: Secondary | ICD-10-CM | POA: Diagnosis not present

## 2021-09-06 DIAGNOSIS — F4310 Post-traumatic stress disorder, unspecified: Secondary | ICD-10-CM | POA: Diagnosis not present
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
## 2021-09-07 NOTE — Telephone Encounter (Signed)
Printed forms and placed in your basket.

## 2021-09-08 NOTE — Telephone Encounter (Signed)
Faxed forms to [phone]  Fax confirmation successful

## 2021-09-13 ENCOUNTER — Ambulatory Visit: Payer: Managed Care, Other (non HMO) | Admitting: Sports Medicine

## 2021-09-19 ENCOUNTER — Ambulatory Visit (INDEPENDENT_AMBULATORY_CARE_PROVIDER_SITE_OTHER): Payer: Managed Care, Other (non HMO) | Admitting: Sports Medicine

## 2021-09-19 DIAGNOSIS — L729 Follicular cyst of the skin and subcutaneous tissue, unspecified: Secondary | ICD-10-CM | POA: Diagnosis not present

## 2021-09-19 NOTE — Assessment & Plan Note (Addendum)
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 She is presenting today for surgical excision. She does have significant baseline anxiety, and has not had any preoperative anxiolytics unfortunately. We performed a surgical excision today with 3 sutures placed, she will return Monday or Tuesday of next week for suture removal.

## 2021-09-19 NOTE — Progress Notes (Signed)
    Procedures performed today:    Procedure:  Excision of 1 cm left eyebrow subcutaneous tumor, possibly dermoid cyst Risks, benefits, and alternatives explained and consent obtained. Time out conducted. Surface prepped with alcohol. 3cc lidocaine with epinephine infiltrated in a field block. Partial analgesia occurred. Area prepped and draped in a sterile fashion. Excision performed with: I made a circumferential incision with a #10 blade, remove the lesion and closed the incision with three 5-0 simple interrupted Ethilon sutures. Hemostasis achieved. Pt stable.  Independent interpretation of notes and tests performed by another provider:   None.  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 She is presenting today for surgical excision. She does have significant baseline anxiety, and has not had any preoperative anxiolytics unfortunately. We performed a surgical excision today with 3 sutures placed, she will return Monday or Tuesday of next week for suture removal.  Chronic process with exacerbation/not at goal and pharmacologic intervention  ____________________________________________ Gwen Her. Dianah Field, M.D., ABFM., CAQSM., AME. Primary Care and Sports Medicine [facility]  Adjunct Professor of [place] of [facility] of Medicine  Risk manager

## 2021-09-19 NOTE — Patient Instructions (Signed)
Incision Care, Adult An incision is a surgical cut that is made through your skin. Most incisions are closed after a surgical procedure. Your incision may be closed with stitches (sutures), staples, skin glue, or adhesive strips. You may need to return to your health care provider to have sutures or staples removed. This may occur several days or several weeks after your surgery. Until then, the incision needs to be cared for properly to prevent infection. Follow instructions from your health care provider about how to care for your incision. Supplies needed: Soap, water, and a clean hand towel. Wound cleanser. A clean bandage (dressing), if needed. Cream or ointment, if told by your health care provider. Clean gauze. How to care for your incision Cleaning the incision Ask your health care provider how to clean the incision. This may include: Wearing medical gloves. Using mild soap and water, or wound cleanser. Using a clean gauze to pat the incision dry after cleaning it. Dressing changes Wash your hands with soap and water for at least 20 seconds before and after you change the dressing. If soap and water are not available, use hand sanitizer. Do not use disinfectants or antiseptics, such as rubbing alcohol, to clean open wounds unless told by your health care provider. Change your dressing as told by your health care provider. Leave sutures, staples, skin glue, or adhesive strips in place. These skin closures may need to stay in place for 2 weeks or longer. If adhesive strip edges start to loosen and curl up, you may trim the loose edges. Do not remove adhesive strips completely unless your health care provider tells you to do that. Apply cream or ointment. Do this only as told by your health care provider. Cover the incision with a clean dressing. Ask your health care provider when you can start to leave the incision uncovered. Checking for infection Check your incision area every day for  signs of infection. Check for: More redness, swelling, or pain. More fluid or blood. New warmth, hardness, or a rash that develops along the incision. Pus or a bad smell.  Follow these instructions at home Medicines Take over-the-counter and prescription medicines only as told by your health care provider. If you were prescribed an antibiotic medicine, cream, or ointment, take or apply it as told by your health care provider. Do not stop using the antibiotic even if your condition improves. Eating and drinking Eat a diet that includes protein, vitamin A, vitamin C, and other nutrient-rich foods to help the wound heal. Foods rich in protein include meat, fish, eggs, dairy, beans, nuts, and protein supplement drinks. Foods rich in vitamin A include carrots and dark green, leafy vegetables. Foods rich in vitamin C include citrus fruits, tomatoes, broccoli, and peppers. Drink enough fluid to keep your urine pale yellow. General instructions  Do not take baths, swim, use a hot tub, or do anything that would put the incision underwater until your health care provider approves. Ask your health care provider if you may take showers. You may only be allowed to take sponge baths. Limit movement around your incision to promote healing. Avoid straining, lifting, or exercising for the first 2 weeks after your procedure, or for as long as told by your health care provider. Return to your normal activities as told by your health care provider. Ask your health care provider what activities are safe for you. Do not scratch, pick, or scrub the incision. Keep it covered as told by your health care provider.   Protect your incision from the sun when you are outside for the first 6 months, or for as long as told by your health care provider. Cover up the scar area or apply sunscreen that has an SPF of at least 30. Do not use any products that contain nicotine or tobacco, such as cigarettes, e-cigarettes, and  chewing tobacco. These can delay incision healing. If you need help quitting, ask your health care provider. Keep all follow-up visits. This is important. Contact a health care provider if: You have any of these signs of infection: More redness, swelling, or pain around your incision. More fluid or blood coming from your incision. New warmth or hardness around your incision. Pus or a bad smell coming from your incision. A rash that develops along the incision. You feel nauseous or you vomit. You are dizzy. Your sutures, staples, skin glue, or adhesive strips come undone. Your wound gets bigger. You have a fever. Get help right away if: Your incision bleeds through the dressing and the bleeding does not stop with gentle pressure. The edges of your incision open up and separate. These symptoms may represent a serious problem that is an emergency. Do not wait to see if the symptoms will go away. Get medical help right away. Call your local emergency services (911 in the [place] Do not drive yourself to the hospital. Summary Follow instructions from your health care provider about how to care for your incision. Wash your hands with soap and water for at least 20 seconds before and after you change the dressing. If soap and water are not available, use hand sanitizer. Check your incision area every day for signs of infection. Keep all follow-up visits. This is important. This information is not intended to replace advice given to you by your health care provider. Make sure you discuss any questions you have with your health care provider. Document Revised: 04/25/2020 Document Reviewed: 04/25/2020 Elsevier Patient Education  2023 Elsevier Inc.  

## 2021-09-26 ENCOUNTER — Ambulatory Visit: Payer: Managed Care, Other (non HMO) | Admitting: Sports Medicine

## 2021-09-26 ENCOUNTER — Encounter: Payer: Self-pay | Admitting: Medical-Surgical

## 2021-09-26 DIAGNOSIS — L729 Follicular cyst of the skin and subcutaneous tissue, unspecified: Secondary | ICD-10-CM

## 2021-09-26 NOTE — Progress Notes (Signed)
    Procedures performed today:    None.  Independent interpretation of notes and tests performed by another provider:   None.  Brief History, Exam, Impression, and Recommendations:    Cyst of skin of eyebrow 31 year old female returns, she is 7 days post surgical excision of what appeared to be a dermoid cyst left eyebrow eyebrow, I placed 3 simple interrupted sutures, these were removed today, incision is clean, dry, intact, Dermabond applied, return to see me as needed.   ____________________________________________ Gwen Her. Dianah Field, M.D., ABFM., CAQSM., AME. Primary Care and Sports Medicine [facility]  Adjunct Professor of [place] of [facility] of Medicine  Risk manager

## 2021-09-26 NOTE — Assessment & Plan Note (Signed)
31 year old female returns, she is 7 days post surgical excision of what appeared to be a dermoid cyst left eyebrow eyebrow, I placed 3 simple interrupted sutures, these were removed today, incision is clean, dry, intact, Dermabond applied, return to see me as needed.

## 2021-09-26 NOTE — Telephone Encounter (Signed)
I have added a charge sheet to the ADA forms and placed in Joy's basket.

## 2021-09-27 NOTE — Telephone Encounter (Signed)
Forms have been completed for additional information regarding the ADA accommodation request.  These forms have been placed in Tasha Barrett's basket.  Please let her know that they been completed.  Fax to the number included on the forms, make 2 copies (1 to scan and 1 for our records), and placed originals at the front desk for her to pick up at her convenience. ___________________________________________ Clearnce Sorrel, DNP, APRN, FNP-BC Primary Care and [place]

## 2021-09-28 NOTE — Telephone Encounter (Signed)
Forms have been faxed to [phone]  Fax confirmation successful.  2 copies made and the original placed up front for the patient to pick up at her convenience.  Patient notified by MyChart.

## 2021-11-05 ENCOUNTER — Ambulatory Visit (INDEPENDENT_AMBULATORY_CARE_PROVIDER_SITE_OTHER): Payer: Medicaid Other

## 2021-11-05 ENCOUNTER — Encounter: Payer: Self-pay | Admitting: Emergency Medicine

## 2021-11-05 ENCOUNTER — Ambulatory Visit
Admission: EM | Admit: 2021-11-05 | Discharge: 2021-11-05 | Disposition: A | Discharge status: Home / Self Care | Payer: Medicaid Other | Attending: Family Medicine | Admitting: Family Medicine

## 2021-11-05 DIAGNOSIS — Z419 Encounter for procedure for purposes other than remedying health state, unspecified: Secondary | ICD-10-CM | POA: Diagnosis not present

## 2021-11-05 DIAGNOSIS — M7918 Myalgia, other site: Secondary | ICD-10-CM | POA: Insufficient documentation

## 2021-11-05 DIAGNOSIS — M545 Low back pain, unspecified: Secondary | ICD-10-CM | POA: Diagnosis not present

## 2021-11-05 DIAGNOSIS — Z9189 Other specified personal risk factors, not elsewhere classified: Secondary | ICD-10-CM | POA: Insufficient documentation

## 2021-11-05 DIAGNOSIS — M25551 Pain in right hip: Secondary | ICD-10-CM | POA: Diagnosis not present

## 2021-11-05 DIAGNOSIS — R1031 Right lower quadrant pain: Secondary | ICD-10-CM | POA: Insufficient documentation

## 2021-11-05 LAB — POCT URINALYSIS DIP (MANUAL ENTRY)
Bilirubin, UA: NEGATIVE
Blood, UA: NEGATIVE
Glucose, UA: NEGATIVE mg/dL
Ketones, POC UA: NEGATIVE mg/dL
Leukocytes, UA: NEGATIVE
Nitrite, UA: NEGATIVE
Protein Ur, POC: NEGATIVE mg/dL
Spec Grav, UA: 1.015 (ref 1.010–1.025)
Urobilinogen, UA: 0.2 E.U./dL
pH, UA: 7.5 (ref 5.0–8.0)

## 2021-11-05 LAB — POCT URINE PREGNANCY: Preg Test, Ur: NEGATIVE

## 2021-11-05 MED ORDER — METHYLPREDNISOLONE 4 MG PO TBPK: ORAL_TABLET | ORAL | 0 refills | Status: DC

## 2021-11-05 MED ORDER — CYCLOBENZAPRINE HCL 10 MG PO TABS: 10.0000 mg | ORAL_TABLET | Freq: Two times a day (BID) | ORAL | 0 refills | Status: DC | PRN

## 2021-11-05 NOTE — Discharge Instructions (Signed)
Take the methylprednisolone as directed.  This is a steroid to reduce inflammation around your back and sciatic nerve Take Flexeril 2 times a day.  This is a muscle relaxer.  It may cause drowsiness.  Be careful driving on Flexeril After you finish the methylprednisolone you can take Aleve 2 pills in the morning and 2 pills at night if needed for any residual back pain If your abdominal pain resolves with this treatment, no additional care should be necessary.  If you continue to have abdominal pain call your primary care doctor to see about getting an ultrasound  Your lumbar back x-ray is normal
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
## 2021-11-05 NOTE — ED Triage Notes (Signed)
Patient c/o low back pain that radiates around to the right side x 2 weeks or longer.  The pain comes and goes, no apparent ua sx's.  No apparent injury.  Patient denies any OTC pain meds.

## 2021-11-07 ENCOUNTER — Telehealth: Payer: Managed Care, Other (non HMO) | Admitting: Physician Assistant

## 2021-11-07 DIAGNOSIS — F411 Generalized anxiety disorder: Secondary | ICD-10-CM | POA: Diagnosis not present

## 2021-11-07 DIAGNOSIS — R102 Pelvic and perineal pain: Secondary | ICD-10-CM

## 2021-11-07 LAB — CERVICOVAGINAL ANCILLARY ONLY
Bacterial Vaginitis (gardnerella): NEGATIVE
Chlamydia: NEGATIVE
Comment: NEGATIVE
Comment: NEGATIVE
Comment: NEGATIVE
Comment: NORMAL
Neisseria Gonorrhea: NEGATIVE
Trichomonas: NEGATIVE

## 2021-11-07 LAB — HIV ANTIBODY (ROUTINE TESTING W REFLEX): HIV 1&2 Ab, 4th Generation: NONREACTIVE

## 2021-11-07 LAB — RPR: RPR Ser Ql: NONREACTIVE

## 2021-11-07 NOTE — Progress Notes (Signed)
Patient requesting ultrasound for ongoing lower abdominal pain. Was seen at [facility] on 10/1 for RLQ pain and tenderness but also R lower back pain radiating to knee. Given script for Flexeril and Medrol to take as directed. Recommended as well she return for [place] (was after regular hours so [place] not available). HAs not taken any medications as she feels she does not need them as they may mask the underlying issue. Requesting the [place] but is aware we cannot order that for her with our Virtual UC platform. She is going to follow-up with [facility] or be evaluated at nearest ER if anything worsening before her evaluation. PCP made aware.   No charge for VUCV.

## 2021-11-08 ENCOUNTER — Encounter: Payer: Self-pay | Admitting: Medical-Surgical

## 2021-11-08 ENCOUNTER — Ambulatory Visit (HOSPITAL_BASED_OUTPATIENT_CLINIC_OR_DEPARTMENT_OTHER)
Admission: RE | Admit: 2021-11-08 | Discharge: 2021-11-08 | Disposition: A | Discharge status: Home / Self Care | Payer: Medicaid Other | Source: Ambulatory Visit | Attending: Medical-Surgical | Admitting: Medical-Surgical

## 2021-11-08 ENCOUNTER — Ambulatory Visit (INDEPENDENT_AMBULATORY_CARE_PROVIDER_SITE_OTHER): Payer: Medicaid Other | Admitting: Medical-Surgical

## 2021-11-08 VITALS — BP 133/88 | HR 84 | Resp 20 | Ht 62.5 in | Wt 221.4 lb

## 2021-11-08 DIAGNOSIS — N858 Other specified noninflammatory disorders of uterus: Secondary | ICD-10-CM | POA: Diagnosis not present

## 2021-11-08 DIAGNOSIS — D259 Leiomyoma of uterus, unspecified: Secondary | ICD-10-CM | POA: Diagnosis not present

## 2021-11-08 DIAGNOSIS — R1031 Right lower quadrant pain: Secondary | ICD-10-CM | POA: Insufficient documentation

## 2021-11-08 DIAGNOSIS — R102 Pelvic and perineal pain: Secondary | ICD-10-CM

## 2021-11-08 DIAGNOSIS — M545 Low back pain, unspecified: Secondary | ICD-10-CM | POA: Diagnosis not present

## 2021-11-08 NOTE — Progress Notes (Signed)
Established Patient Office Visit  Subjective   Patient ID: Tasha Barrett, female   DOB: 21-Apr-1990 Age: 31 y.o. MRN: [id number]   Chief Complaint  Patient presents with   Follow-up   Back Pain    HPI Pleasant 31 year old female presenting today for back/pelvic pain. She was seen in urgent care on 10/2 with similar symptoms and was prescribed medications to treat for a muscular strain in the back. Originally, an ultrasound was considered but there was no ultrasound availability due to the late hour. She was instructed to try the medications that were prescribed to see if that was helpful for her symptoms. After her visit, she decided she did not want to mask anything so she did not take the medications. On 10/3, she attempted an E-visit to get an order for an ultrasound. Unfortunately, that was outside of their scope so she was advised to return to urgent care or her PCP. This morning, she went to urgent care but they directed her to our office. She reports that her symptoms have actually been present for a few weeks and have gotten worse over the weekend. Notes that since she got her IUD out, her periods have gotten a lot heavier. Her last period was very heavy although it lasted the normal number of days. She has develeoped low back pain bilaterally that radiates around to the front of the pelvic area and also shoots down to the knee. This has limited her ability to function and at times she has been unable to walk. Today, she notes that her back pain is less however her lower abdomen and pelvic pain is still present. She did try Tylenol but it was not helpful. Wants to have an US done as she is sure that there is something going on and worries that it may be her ovaries.    Objective:    Vitals:   11/08/21 0940  BP: 133/88  Pulse: 84  Resp: 20  Height: 5' 2.5" (1.588 m)  Weight: 221 lb 6.4 oz (100.4 kg)  SpO2: 100%  BMI (Calculated): 39.82    Physical Exam Vitals reviewed.   Constitutional:      General: She is not in acute distress.    Appearance: Normal appearance. She is not ill-appearing.  HENT:     Head: Normocephalic and atraumatic.  Cardiovascular:     Rate and Rhythm: Normal rate and regular rhythm.     Pulses: Normal pulses.     Heart sounds: Normal heart sounds.  Pulmonary:     Effort: Pulmonary effort is normal. No respiratory distress.     Breath sounds: Normal breath sounds. No wheezing, rhonchi or rales.  Abdominal:     General: Bowel sounds are normal. There is no distension.     Palpations: Abdomen is soft. There is no mass.     Tenderness: There is abdominal tenderness (right and left of the suprapubic area). There is guarding. There is no rebound.  Skin:    General: Skin is warm and dry.  Neurological:     Mental Status: She is alert and oriented to person, place, and time.  Psychiatric:        Mood and Affect: Mood normal.        Behavior: Behavior normal.        Thought Content: Thought content normal.        Judgment: Judgment normal.   No results found for this or any previous visit (from the past 24 hour(s)).  The ASCVD Risk score (Arnett DK, et al., 2019) failed to calculate for the following reasons:   The 2019 ASCVD risk score is only valid for ages 23 to 69   Assessment & Plan:   1. Pelvic pain Unclear etiology. Possible ovarian cysts. Low suspicion for torsion. No red flags to indicate emergent intervention. Pelvic ultrasound today for further investigation.  - US Pelvic Complete With Transvaginal; Future  2. Acute bilateral low back pain without sciatica Unclear etiology. Presentation possibly related to paraspinal muscle strain, SI joint dysfunction, lumbar spine pathology, or even GI issues. Potentially related to the pelvic pain but may also be a separate process. Recommend trialing medications that were prescribed by urgent care for now.   Return if symptoms worsen or fail to  improve.  ___________________________________________ Clearnce Sorrel, DNP, APRN, FNP-BC Primary Care and [place]

## 2021-11-09 ENCOUNTER — Other Ambulatory Visit: Payer: Managed Care, Other (non HMO)

## 2021-11-10 ENCOUNTER — Telehealth: Payer: Self-pay

## 2021-11-10 DIAGNOSIS — R102 Pelvic and perineal pain: Secondary | ICD-10-CM

## 2021-11-10 DIAGNOSIS — D259 Leiomyoma of uterus, unspecified: Secondary | ICD-10-CM

## 2021-11-10 NOTE — Telephone Encounter (Signed)
Tasha Barrett would like to be referred to Tasha Barrett in the building. Pended referral.

## 2021-11-10 NOTE — Telephone Encounter (Signed)
Referral signed. ___________________________________________ Clearnce Sorrel, DNP, APRN, FNP-BC Primary Care and Sports Medicine [place]

## 2021-11-13 DIAGNOSIS — F411 Generalized anxiety disorder: Secondary | ICD-10-CM | POA: Diagnosis not present

## 2021-11-20 DIAGNOSIS — F900 Attention-deficit hyperactivity disorder, predominantly inattentive type: Secondary | ICD-10-CM | POA: Diagnosis not present

## 2021-11-20 DIAGNOSIS — F331 Major depressive disorder, recurrent, moderate: Secondary | ICD-10-CM | POA: Diagnosis not present

## 2021-11-21 DIAGNOSIS — F411 Generalized anxiety disorder: Secondary | ICD-10-CM | POA: Diagnosis not present

## 2021-11-28 DIAGNOSIS — F411 Generalized anxiety disorder: Secondary | ICD-10-CM | POA: Diagnosis not present

## 2021-12-05 DIAGNOSIS — F411 Generalized anxiety disorder: Secondary | ICD-10-CM | POA: Diagnosis not present

## 2021-12-06 DIAGNOSIS — Z419 Encounter for procedure for purposes other than remedying health state, unspecified: Secondary | ICD-10-CM | POA: Diagnosis not present

## 2021-12-12 DIAGNOSIS — F411 Generalized anxiety disorder: Secondary | ICD-10-CM | POA: Diagnosis not present

## 2021-12-18 DIAGNOSIS — F331 Major depressive disorder, recurrent, moderate: Secondary | ICD-10-CM | POA: Diagnosis not present

## 2021-12-18 DIAGNOSIS — F900 Attention-deficit hyperactivity disorder, predominantly inattentive type: Secondary | ICD-10-CM | POA: Diagnosis not present

## 2021-12-19 DIAGNOSIS — F411 Generalized anxiety disorder: Secondary | ICD-10-CM | POA: Diagnosis not present

## 2021-12-21 NOTE — Progress Notes (Signed)
GYNECOLOGY OFFICE VISIT NOTE  History:   Tasha Barrett is a 31 y.o. G1P1001 here today for pelvic pain (sciatica more so) and growing fibroids. .   She had a TVUS on 10/4. On 10/1 she saw her PCP and she had a vaginitis panel and urine dip both of which were normal.   Plans for child-bearing is that she is done. It greatly affects her mental health in the past as do all hormonal medications. She has tried a variety of hormonal options including OCPs and Mirena IUD.   Her periods have been heavy for the last couple years as well but they are regular.      Past Medical History:  Diagnosis Date   ADHD    Anxiety    Used Latuda (did not work)   Bipolar 2 disorder ([place])    Bipolar disorder ([place])    Depression    Pneumonia    age 57    PTSD (post-traumatic stress disorder)    Yeast infection     Past Surgical History:  Procedure Laterality Date   BREAST REDUCTION SURGERY  01/2015   CESAREAN SECTION N/A 03/09/2014   Procedure: CESAREAN SECTION;  Surgeon: Donnamae Jude, MD;  Location: [facility];  Service: Obstetrics;  Laterality: N/A;    The following portions of the patient's history were reviewed and updated as appropriate: allergies, current medications, past family history, past medical history, past social history, past surgical history and problem list.   Health Maintenance:   Normal pap and negative HRHPV on 08/30/2020.   Diagnosis  Date Value Ref Range Status  08/30/2020   Final   - Negative for intraepithelial lesion or malignancy (NILM)   Review of Systems:  Pertinent items noted in HPI and remainder of comprehensive ROS otherwise negative.  Physical Exam:  There were no vitals taken for this visit. CONSTITUTIONAL: Well-developed, well-nourished female in no acute distress.  HEENT:  Normocephalic, atraumatic. External right and left ear normal. No scleral icterus.  NECK: Normal range of motion, supple, no masses noted on observation SKIN: No rash noted. Not  diaphoretic. No erythema. No pallor. MUSCULOSKELETAL: Normal range of motion. No edema noted. NEUROLOGIC: Alert and oriented to person, place, and time. Normal muscle tone coordination. No cranial nerve deficit noted. PSYCHIATRIC: Normal mood and affect. Normal behavior. Normal judgment and thought content.  ABDOMEN: No masses noted. No other overt distention noted.  Fundus is not palpable.   PELVIC: Deferred  She had an [place] on 10/4. I reviewed the report and the images myself.  My review of [place] She has multiple fibroids Fundal, serosal, exophytic, 3.5 cm Fundal, intramural, calcified 2 cm Anterior, serosal, adjacent to her bladder and possibly compressing it some - 4 cm Anterior, intramural, 1 cm Posterior, intramural, lower uterine segment - 2 cm Posterior, intramural, mid body, 2 cm None of the fibroids appear to be communicating with the EL.  Overall uterine size is about 9 cm. EL is normal at 27m.  I also noted the echogenicity within the bladder. She had a urine dip on 10/1 which was completely normal.  This is a change from her [place] done on 10/2017 which measured 2 fibroids and they were 1.6 and 1.4 cm in maximum dimensions.  Assessment and Plan:   1. Pelvic pain It is possible her fibroids are causing her discomfort however it is also equally likely they are not. Reviewed any intervention to treat the fibroids may not help her pain if this  is her primary concern.   2. Uterine leiomyoma, unspecified location - Options she would not be a candidate for - Sonata due to locations, hysteroscopic resection as none are submucosal - She could try hormonal therapy such as birth control options or the GnRH antagonists. She is hesitant to try anything that affects her hormones due to her mental health.  - [name] and hysterectomy would be reserved for if done with child-bearing. We discussed the recovery from each and the risks of hysterectomy. We discussed the risks of hysterectomy would be  high due to prior c-section and location of her fibroids, especially that of bladder injury.  - Myomectomy would be possible but would depend on her goal for removal. Given the number of fibroids and her young age, her risk of recurrence would be high. She does not want any more children so this is not a concern.  - I advised [name] given the multiple fibroids. If [name] didn't work, then she would have the option still for hysterectomy. Reviewed pregnancy should not occur after [name]. She would want tubal as well. Tubal papers signed today. She understands these would not be on the same day. Reviewed process of [name] (MRI, consult, etc). She will consider and send me a message.   30 minutes spent face-to-face counseling the patient and additional time was spent in chart prep reviewed her prior ultrasound images myself.   Routine preventative health maintenance measures emphasized. Please refer to After Visit Summary for other counseling recommendations.   No follow-ups on file.  Radene Gunning, MD, [place] [facility], [place]

## 2021-12-25 ENCOUNTER — Ambulatory Visit: Payer: Commercial Managed Care - HMO | Admitting: Obstetrics and Gynecology

## 2021-12-25 ENCOUNTER — Encounter: Payer: Medicaid Other | Admitting: Obstetrics and Gynecology

## 2021-12-25 ENCOUNTER — Encounter: Payer: Self-pay | Admitting: Obstetrics and Gynecology

## 2021-12-25 VITALS — BP 126/83 | HR 91 | Resp 16 | Ht 62.0 in | Wt 223.0 lb

## 2021-12-25 DIAGNOSIS — R102 Pelvic and perineal pain: Secondary | ICD-10-CM | POA: Diagnosis not present

## 2021-12-25 DIAGNOSIS — D259 Leiomyoma of uterus, unspecified: Secondary | ICD-10-CM

## 2021-12-25 DIAGNOSIS — F411 Generalized anxiety disorder: Secondary | ICD-10-CM | POA: Diagnosis not present

## 2021-12-25 NOTE — Patient Instructions (Signed)
We discussed possible options: [place] or [facility] antagonists) Uterine Artery Embolization Hysterectomy

## 2022-01-02 DIAGNOSIS — F411 Generalized anxiety disorder: Secondary | ICD-10-CM | POA: Diagnosis not present

## 2022-01-03 ENCOUNTER — Encounter: Payer: Self-pay | Admitting: Obstetrics and Gynecology

## 2022-01-03 DIAGNOSIS — D259 Leiomyoma of uterus, unspecified: Secondary | ICD-10-CM

## 2022-01-03 DIAGNOSIS — R102 Pelvic and perineal pain: Secondary | ICD-10-CM

## 2022-01-05 DIAGNOSIS — Z419 Encounter for procedure for purposes other than remedying health state, unspecified: Secondary | ICD-10-CM | POA: Diagnosis not present

## 2022-01-09 DIAGNOSIS — F411 Generalized anxiety disorder: Secondary | ICD-10-CM | POA: Diagnosis not present

## 2022-01-10 ENCOUNTER — Ambulatory Visit
Admission: RE | Admit: 2022-01-10 | Discharge: 2022-01-10 | Disposition: A | Discharge status: Home / Self Care | Payer: Commercial Managed Care - HMO | Source: Ambulatory Visit | Attending: Obstetrics and Gynecology | Admitting: Obstetrics and Gynecology

## 2022-01-10 ENCOUNTER — Other Ambulatory Visit (HOSPITAL_COMMUNITY): Payer: Self-pay | Admitting: Interventional Radiology

## 2022-01-10 DIAGNOSIS — D259 Leiomyoma of uterus, unspecified: Secondary | ICD-10-CM

## 2022-01-10 DIAGNOSIS — N920 Excessive and frequent menstruation with regular cycle: Secondary | ICD-10-CM | POA: Diagnosis not present

## 2022-01-10 DIAGNOSIS — R102 Pelvic and perineal pain: Secondary | ICD-10-CM

## 2022-01-10 NOTE — Consult Note (Signed)
Chief Complaint: Patient was seen in consultation today for Symptomatic uterine fibroids at the request of Duncan,Paula  Referring Physician(s): Duncan,Paula  History of Present Illness: Tasha Barrett is a 31 y.o. female With a history of symptomatic uterine fibroids Since at least 2015.  She is G2, P1 with no plans for future pregnancy.  She has had 1 C-section.   She describes heavy periods lasting 7 days with 5 days of heavy bleeding, on a regular schedule every 26 to 28 days.  She is having significant low back pain and right lower extremity pain.  She is having significant uterine cramping during her cycles.  She tried an IUD about a year ago but this had to be removed after several months due to pain.  She has had persistent uterine bleeding since then.  She has not tried birth control pills for symptom relief, as she had poor mental health reaction to hormones in the past. She is scheduled for salpingectomy in   January as a form of birth control.  Past Medical History:  Diagnosis Date   ADHD    Anxiety    Used Latuda (did not work)   Bipolar 2 disorder ([place])    Bipolar disorder ([place])    Depression    Pneumonia    age 73    PTSD (post-traumatic stress disorder)    Yeast infection     Past Surgical History:  Procedure Laterality Date   BREAST REDUCTION SURGERY  01/2015   CESAREAN SECTION N/A 03/09/2014   Procedure: CESAREAN SECTION;  Surgeon: Donnamae Jude, MD;  Location: [facility];  Service: Obstetrics;  Laterality: N/A;    Allergies: Amoxicillin-pot clavulanate and Latex  Medications: Prior to Admission medications   Medication Sig Start Date End Date Taking? Authorizing Provider  FLUoxetine (PROZAC) 10 MG capsule Take 10 mg by mouth daily. Patient not taking: Reported on 12/25/2021 11/21/21   [provider]  lisdexamfetamine (VYVANSE) 40 MG capsule Take 1 capsule (40 mg total) by mouth daily. 06/19/21   Samuel Bouche, NP  mometasone (ELOCON) 0.1 % cream  Apply 1 application topically daily. Patient not taking: Reported on 12/25/2021 08/30/20   Terrilyn Saver, NP  propranolol (INDERAL) 20 MG/5ML solution  09/05/21   [provider]  venlafaxine XR (EFFEXOR-XR) 75 MG 24 hr capsule     [provider]     Family History  Problem Relation Age of Onset   Depression Mother    Gout Father    Hypertension Father    Gout Paternal Uncle    Gout Paternal Grandfather    Anesthesia problems Neg Hx    Hypotension Neg Hx    Malignant hyperthermia Neg Hx    Pseudochol deficiency Neg Hx     Social History   Socioeconomic History   Marital status: Divorced    Spouse name: Not on file   Number of children: Not on file   Years of education: Not on file   Highest education level: Not on file  Occupational History   Occupation: employed  Tobacco Use   Smoking status: Never   Smokeless tobacco: Never  Vaping Use   Vaping Use: Some days   Substances: Mixture of cannabinoids  Substance and Sexual Activity   Alcohol use: Not Currently   Drug use: Yes    Types: Marijuana    Comment: States 5-6 times/year; through vaping   Sexual activity: Yes    Partners: Male    Birth control/protection: None  Other Topics Concern   Not on file  Social History Narrative   Not on file   Social Determinants of Health   Financial Resource Strain: Not on file  Food Insecurity: Not on file  Transportation Needs: Not on file  Physical Activity: Not on file  Stress: Not on file  Social Connections: Not on file    ECOG Status: 1 - Symptomatic but completely ambulatory  Review of Systems: A 12 point ROS discussed and pertinent positives are indicated in the HPI above.  All other systems are negative.  Review of Systems  Vital Signs: BP (!) 115/57 (BP Location: Right Arm, Patient Position: Sitting, Cuff Size: Large)   Pulse 83   Temp 98.5 F (36.9 C) (Oral)   Ht '5\' 2"'$  (1.575 m)   Wt 101.2 kg   LMP 12/17/2021   SpO2 99% Comment:  room air  BMI 40.79 kg/m     Physical Exam O2 sats 99%. BP 115/57.  HR 83.  T 98.5 oral. Constitutional: Oriented to person, place, and time. Well-developed and well-nourished. No distress.  Last Weight  Most recent update: 01/10/2022 10:06 AM    Weight  101.2 kg (223 lb)            HENT:  Head: Normocephalic and atraumatic.  Eyes: Conjunctivae and EOM are normal. Right eye exhibits no discharge. Left eye exhibits no discharge. No scleral icterus.  Neck: No JVD present.  Pulmonary/Chest: Effort normal. No stridor. No respiratory distress.  Abdomen: soft, non distended Neurological:  alert and oriented to person, place, and time.  Skin: Skin is warm and dry.  not diaphoretic.  Psychiatric:   normal mood and affect.   behavior is normal. Judgment and thought content normal.    Imaging: TRANSABDOMINAL AND TRANSVAGINAL ULTRASOUND OF PELVIS  TECHNIQUE: Both transabdominal and transvaginal ultrasound examinations of the pelvis were performed. Transabdominal technique was performed for global imaging of the pelvis including uterus, ovaries, adnexal regions, and pelvic cul-de-sac. It was necessary to proceed with endovaginal exam following the transabdominal exam to visualize the adnexal structures.  COMPARISON: Pelvic ultrasound October 22, 2017  FINDINGS: Uterus  Measurements: 9.1 x 4.4 x 4.6 cm = volume: 96.0 mL. Multiple fibroids are demonstrated throughout the uterus. The largest fibroid is exophytic off the uterine fundus measuring 3.4 x 3.2 x 4.1 cm. Additional fibroids are demonstrated throughout the uterus including a calcified 1.1 x 1.2 x 1.7 cm fibroid within the anterior uterine fundus.  Endometrium  Thickness: 10.2 mm. No focal abnormality visualized.  Right ovary  Measurements: 4.4 x 2.5 x 2.0 cm = volume: 11.1 mL. Normal appearance/no adnexal mass.  Left ovary  Measurements: 2.2 x 1.8 x 2.0 cm = volume: 4.1 mL. Normal appearance/no adnexal  mass.  Other findings  Nonspecific echogenicity in the urinary bladder.  IMPRESSION: 1. No acute process within the pelvis. 2. Fibroid uterus. 3. Nonspecific echogenicity in the urinary bladder. Recommend correlation with urinalysis.   Electronically Signed By: Lovey Newcomer M.D. On: 11/08/2021 16:40   Labs:  CBC: No results for input(s): "WBC", "HGB", "HCT", "PLT" in the last 8760 hours.  COAGS: No results for input(s): "INR", "APTT" in the last 8760 hours.  BMP: No results for input(s): "NA", "K", "CL", "CO2", "GLUCOSE", "BUN", "CALCIUM", "CREATININE", "GFRNONAA", "GFRAA" in the last 8760 hours.  Invalid input(s): "CMP"  LIVER FUNCTION TESTS: No results for input(s): "BILITOT", "AST", "ALT", "ALKPHOS", "PROT", "ALBUMIN" in the last 8760 hours.  TUMOR MARKERS: No results for input(s): "AFPTM", "CEA", "  CA199", "CHROMGRNA" in the last 8760 hours.  Assessment and Plan:  My impression is that this patient's menorrhagia is  likely secondary to uterine fibroids. Pain may be related to bulk symptoms.  We spent the majority of the consultation discussing the pathophysiology of uterine leiomyomata, natural history, anticipated  involution post menopause, and treatment options. We discussed myomectomy, hysterectomy, and uterine fibroid embolization. I described the technique of UFE, anticipated benefits, possible risks and complications including but not limited to bleeding, infection, vessel damage, nontarget embolization, and incomplete symptom relief. We discussed the 90% clinical success rate historically and at our experience with UFE for bleeding, 80-90% for bulk sypmtoms.. We discussed the post procedure course and time course of symptom resolution. We discussed the need for continued gynecologic care.  She seemed to understand and did ask appropriate questions, which were answered.  Based on her evaluation thus far, I think she would be an appropriate candidate for uterine fibroid  embolization because of her symptomatology and  uterine fibroids. To complete her evaluation and workup, I would require pelvic MRI with contrast to best determine the exact site of location of her uterine fibroids, specifically to exclude any pedunculated fibroids on a narrow stalk, as well as to exclude any unexpected pelvic pathology.    After our discussion, the patient was motivated proceed. Accordingly, we can set up the MRI   at her convenience as an outpatient. If this looks okay, she would like to proceed with UFE.   Thank you for this interesting consult.  I greatly enjoyed meeting ALEIGHA GILANI and look forward to participating in their care.  A copy of this report was sent to the requesting provider on this date.  Electronically Signed: Rickard Rhymes 01/10/2022, 10:58 AM   I spent a total of  40 Minutes   in face to face in clinical consultation, greater than 50% of which was counseling/coordinating care for symptomatic uterine fibroids.

## 2022-01-16 DIAGNOSIS — F411 Generalized anxiety disorder: Secondary | ICD-10-CM | POA: Diagnosis not present

## 2022-01-23 ENCOUNTER — Encounter: Payer: Self-pay | Admitting: Obstetrics and Gynecology

## 2022-01-23 ENCOUNTER — Other Ambulatory Visit: Payer: Medicaid Other

## 2022-01-23 DIAGNOSIS — F411 Generalized anxiety disorder: Secondary | ICD-10-CM | POA: Diagnosis not present

## 2022-01-24 ENCOUNTER — Telehealth: Payer: Self-pay | Admitting: *Deleted

## 2022-01-24 NOTE — Telephone Encounter (Signed)
Patient will have [employer] effective on 02/05/2022, prior to surgery. But she does not have the information as of yet.

## 2022-01-25 ENCOUNTER — Ambulatory Visit
Admission: RE | Admit: 2022-01-25 | Discharge: 2022-01-25 | Disposition: A | Discharge status: Home / Self Care | Payer: Commercial Managed Care - HMO | Source: Ambulatory Visit | Attending: Obstetrics and Gynecology | Admitting: Obstetrics and Gynecology

## 2022-01-25 ENCOUNTER — Encounter: Payer: Self-pay | Admitting: Obstetrics and Gynecology

## 2022-01-25 DIAGNOSIS — R102 Pelvic and perineal pain: Secondary | ICD-10-CM

## 2022-01-25 DIAGNOSIS — D259 Leiomyoma of uterus, unspecified: Secondary | ICD-10-CM

## 2022-01-25 MED ORDER — GADOPICLENOL 0.5 MMOL/ML IV SOLN
10.0000 mL | Freq: Once | INTRAVENOUS | Status: AC | PRN
  Administered 2022-01-25: 10 mL via INTRAVENOUS

## 2022-01-30 DIAGNOSIS — F411 Generalized anxiety disorder: Secondary | ICD-10-CM | POA: Diagnosis not present

## 2022-02-01 ENCOUNTER — Encounter (HOSPITAL_BASED_OUTPATIENT_CLINIC_OR_DEPARTMENT_OTHER): Payer: Self-pay | Admitting: Obstetrics and Gynecology

## 2022-02-02 ENCOUNTER — Encounter (HOSPITAL_BASED_OUTPATIENT_CLINIC_OR_DEPARTMENT_OTHER): Payer: Self-pay | Admitting: Obstetrics and Gynecology

## 2022-02-02 ENCOUNTER — Other Ambulatory Visit: Payer: Self-pay

## 2022-02-02 DIAGNOSIS — F411 Generalized anxiety disorder: Secondary | ICD-10-CM | POA: Diagnosis not present

## 2022-02-02 NOTE — Progress Notes (Signed)
Spoke w/ via phone for pre-op interview---Tasha Barrett needs dos----urine pregnancy              Barrett results------none COVID test -----patient states asymptomatic no test needed Arrive at -------0930 on Wednesday, 02/07/22 NPO after MN NO Solid Food.  Clear liquids from MN until---0830 Med rec completed Medications to take morning of surgery -----Effexor Diabetic medication -----n/a Patient instructed no nail polish to be worn day of surgery Patient instructed to bring photo id and insurance card day of surgery Patient aware to have Driver (ride ) / caregiver    for 24 hours after surgery - Dad, [facility] Patient Special Instructions -----No alcohol, vaping , drugs or [place] 8 for 24 hours before surgery.Patient informed that body piercings should be removed prior to surgery. Patient states that her piercings in her nipples and nose are acrylic, and she doesn't think she should have to take those out. I instructed her that our guidelines state no body piercings and if she chose to wear them on the day of surgery, she should be prepared to take them out if needed. Pre-Op special Istructions -----none Patient verbalized understanding of instructions that were given at this phone interview. Patient denies shortness of breath, chest pain, fever, cough at this phone interview.

## 2022-02-05 DIAGNOSIS — Z419 Encounter for procedure for purposes other than remedying health state, unspecified: Secondary | ICD-10-CM | POA: Diagnosis not present

## 2022-02-06 NOTE — Anesthesia Preprocedure Evaluation (Signed)
Anesthesia Evaluation  Patient identified by MRN, date of birth, ID band Patient awake    Reviewed: Allergy & Precautions, NPO status , Patient's Chart, lab work & pertinent test results  Airway Mallampati: II  TM Distance: >3 FB Neck ROM: Full    Dental no notable dental hx. (+) Teeth Intact, Dental Advisory Given   Pulmonary neg pulmonary ROS   Pulmonary exam normal breath sounds clear to auscultation       Cardiovascular Exercise Tolerance: Good Normal cardiovascular exam Rhythm:Regular Rate:Normal     Neuro/Psych  PSYCHIATRIC DISORDERS Anxiety Depression    PTSDnegative neurological ROS     GI/Hepatic   Endo/Other    Morbid obesity (BMI 40.8)  Renal/GU K+ 3.5 02/07/2022     Musculoskeletal   Abdominal   Peds  Hematology Lab Results      Component                Value               Date                      WBC                      CANCELED            08/30/2020                HGB                      10.3 (L)            08/30/2020                HCT                      33.5 (L)            08/30/2020                MCV                      76.3 (L)            08/30/2020                PLT                      300                 08/30/2020              Anesthesia Other Findings All: Latex, Amoxicillin  Reproductive/Obstetrics Uterine leiyomyomata                             Anesthesia Physical Anesthesia Plan  ASA: 3  Anesthesia Plan: General   Post-op Pain Management: Toradol IV (intra-op)*, Tylenol PO (pre-op)* and Dilaudid IV   Induction: Intravenous  PONV Risk Score and Plan: 4 or greater and Midazolam, Dexamethasone, Ondansetron and Treatment may vary due to age or medical condition  Airway Management Planned: Oral ETT  Additional Equipment: None  Intra-op Plan:   Post-operative Plan: Extubation in OR  Informed Consent: I have reviewed the patients History and  Physical, chart, labs and discussed the procedure including the risks, benefits and alternatives for the proposed anesthesia with the patient or authorized representative who has  indicated his/her understanding and acceptance.     Dental advisory given  Plan Discussed with:   Anesthesia Plan Comments:         Anesthesia Quick Evaluation

## 2022-02-07 ENCOUNTER — Encounter (HOSPITAL_BASED_OUTPATIENT_CLINIC_OR_DEPARTMENT_OTHER)
Admission: RE | Disposition: A | Discharge status: Home / Self Care | Payer: Self-pay | Source: Home / Self Care | Attending: Obstetrics and Gynecology

## 2022-02-07 ENCOUNTER — Ambulatory Visit (HOSPITAL_BASED_OUTPATIENT_CLINIC_OR_DEPARTMENT_OTHER): Payer: Medicaid Other | Admitting: Anesthesiology

## 2022-02-07 ENCOUNTER — Other Ambulatory Visit: Payer: Self-pay

## 2022-02-07 ENCOUNTER — Ambulatory Visit (HOSPITAL_COMMUNITY)
Admission: RE | Admit: 2022-02-07 | Discharge: 2022-02-07 | Disposition: A | Discharge status: Home / Self Care | Payer: Medicaid Other | Attending: Obstetrics and Gynecology | Admitting: Obstetrics and Gynecology

## 2022-02-07 ENCOUNTER — Encounter (HOSPITAL_BASED_OUTPATIENT_CLINIC_OR_DEPARTMENT_OTHER): Payer: Self-pay | Admitting: Obstetrics and Gynecology

## 2022-02-07 DIAGNOSIS — D251 Intramural leiomyoma of uterus: Secondary | ICD-10-CM | POA: Diagnosis not present

## 2022-02-07 DIAGNOSIS — F32A Depression, unspecified: Secondary | ICD-10-CM | POA: Insufficient documentation

## 2022-02-07 DIAGNOSIS — Z6841 Body Mass Index (BMI) 40.0 and over, adult: Secondary | ICD-10-CM

## 2022-02-07 DIAGNOSIS — F419 Anxiety disorder, unspecified: Secondary | ICD-10-CM | POA: Diagnosis not present

## 2022-02-07 DIAGNOSIS — D252 Subserosal leiomyoma of uterus: Secondary | ICD-10-CM | POA: Insufficient documentation

## 2022-02-07 DIAGNOSIS — Z302 Encounter for sterilization: Secondary | ICD-10-CM | POA: Insufficient documentation

## 2022-02-07 DIAGNOSIS — Z3009 Encounter for other general counseling and advice on contraception: Secondary | ICD-10-CM | POA: Diagnosis present

## 2022-02-07 DIAGNOSIS — F418 Other specified anxiety disorders: Secondary | ICD-10-CM | POA: Diagnosis not present

## 2022-02-07 DIAGNOSIS — D219 Benign neoplasm of connective and other soft tissue, unspecified: Secondary | ICD-10-CM | POA: Diagnosis present

## 2022-02-07 DIAGNOSIS — Z01818 Encounter for other preprocedural examination: Secondary | ICD-10-CM

## 2022-02-07 DIAGNOSIS — D259 Leiomyoma of uterus, unspecified: Secondary | ICD-10-CM | POA: Diagnosis present

## 2022-02-07 HISTORY — DX: Anemia, unspecified: D64.9

## 2022-02-07 HISTORY — DX: Personal history of physical and sexual abuse in childhood: Z62.810

## 2022-02-07 HISTORY — PX: LAPAROSCOPIC BILATERAL SALPINGECTOMY: SHX5889

## 2022-02-07 HISTORY — DX: Borderline personality disorder: F60.3

## 2022-02-07 HISTORY — DX: Benign neoplasm of connective and other soft tissue, unspecified: D21.9

## 2022-02-07 HISTORY — DX: COVID-19: U07.1

## 2022-02-07 LAB — POCT I-STAT, CHEM 8
BUN: 7 mg/dL (ref 6–20)
Calcium, Ion: 1.06 mmol/L — ABNORMAL LOW (ref 1.15–1.40)
Chloride: 106 mmol/L (ref 98–111)
Creatinine, Ser: 0.7 mg/dL (ref 0.44–1.00)
Glucose, Bld: 85 mg/dL (ref 70–99)
HCT: 37 % (ref 36.0–46.0)
Hemoglobin: 12.6 g/dL (ref 12.0–15.0)
Potassium: 3.5 mmol/L (ref 3.5–5.1)
Sodium: 138 mmol/L (ref 135–145)
TCO2: 21 mmol/L — ABNORMAL LOW (ref 22–32)

## 2022-02-07 LAB — POCT PREGNANCY, URINE: Preg Test, Ur: NEGATIVE

## 2022-02-07 SURGERY — SALPINGECTOMY, BILATERAL, LAPAROSCOPIC
Anesthesia: General | Site: Abdomen | Laterality: Bilateral

## 2022-02-07 MED ORDER — OXYCODONE HCL 5 MG PO TABS: 5.0000 mg | ORAL_TABLET | Freq: Once | ORAL | Status: DC | PRN

## 2022-02-07 MED ORDER — ACETAMINOPHEN 500 MG PO TABS
1000.0000 mg | ORAL_TABLET | ORAL | Status: AC
  Administered 2022-02-07: 1000 mg via ORAL

## 2022-02-07 MED ORDER — KETOROLAC TROMETHAMINE 30 MG/ML IJ SOLN
INTRAMUSCULAR | Status: AC
  Filled 2022-02-07: qty 1

## 2022-02-07 MED ORDER — 0.9 % SODIUM CHLORIDE (POUR BTL) OPTIME
TOPICAL | Status: DC | PRN
  Administered 2022-02-07: 500 mL

## 2022-02-07 MED ORDER — KETOROLAC TROMETHAMINE 30 MG/ML IJ SOLN: 30.0000 mg | Freq: Once | INTRAMUSCULAR | Status: DC | PRN

## 2022-02-07 MED ORDER — DEXAMETHASONE SODIUM PHOSPHATE 10 MG/ML IJ SOLN
INTRAMUSCULAR | Status: AC
  Filled 2022-02-07: qty 1

## 2022-02-07 MED ORDER — ACETAMINOPHEN 500 MG PO TABS
ORAL_TABLET | ORAL | Status: AC
  Filled 2022-02-07: qty 2

## 2022-02-07 MED ORDER — HYDROMORPHONE HCL 1 MG/ML IJ SOLN: 0.2500 mg | INTRAMUSCULAR | Status: DC | PRN

## 2022-02-07 MED ORDER — BUPIVACAINE HCL (PF) 0.25 % IJ SOLN
INTRAMUSCULAR | Status: DC | PRN
  Administered 2022-02-07: 20 mL

## 2022-02-07 MED ORDER — LIDOCAINE HCL (CARDIAC) PF 100 MG/5ML IV SOSY
PREFILLED_SYRINGE | INTRAVENOUS | Status: DC | PRN
  Administered 2022-02-07: 60 mg via INTRAVENOUS

## 2022-02-07 MED ORDER — ROCURONIUM BROMIDE 100 MG/10ML IV SOLN
INTRAVENOUS | Status: DC | PRN
  Administered 2022-02-07: 15 mg via INTRAVENOUS
  Administered 2022-02-07: 60 mg via INTRAVENOUS

## 2022-02-07 MED ORDER — PROPOFOL 500 MG/50ML IV EMUL
INTRAVENOUS | Status: AC
  Filled 2022-02-07: qty 50

## 2022-02-07 MED ORDER — ONDANSETRON HCL 4 MG/2ML IJ SOLN
INTRAMUSCULAR | Status: AC
  Filled 2022-02-07: qty 2

## 2022-02-07 MED ORDER — FENTANYL CITRATE (PF) 100 MCG/2ML IJ SOLN
INTRAMUSCULAR | Status: DC | PRN
  Administered 2022-02-07 (×2): 50 ug via INTRAVENOUS

## 2022-02-07 MED ORDER — LACTATED RINGERS IV SOLN
INTRAVENOUS | Status: DC
  Administered 2022-02-07: 1000 mL via INTRAVENOUS

## 2022-02-07 MED ORDER — ONDANSETRON HCL 4 MG/2ML IJ SOLN: 4.0000 mg | Freq: Once | INTRAMUSCULAR | Status: DC | PRN

## 2022-02-07 MED ORDER — OXYCODONE HCL 5 MG/5ML PO SOLN: 5.0000 mg | Freq: Once | ORAL | Status: DC | PRN

## 2022-02-07 MED ORDER — MIDAZOLAM HCL 2 MG/2ML IJ SOLN
INTRAMUSCULAR | Status: DC | PRN
  Administered 2022-02-07: 2 mg via INTRAVENOUS

## 2022-02-07 MED ORDER — KETOROLAC TROMETHAMINE 30 MG/ML IJ SOLN
INTRAMUSCULAR | Status: DC | PRN
  Administered 2022-02-07: 30 mg via INTRAVENOUS

## 2022-02-07 MED ORDER — KETOROLAC TROMETHAMINE 15 MG/ML IJ SOLN: 15.0000 mg | INTRAMUSCULAR | Status: DC

## 2022-02-07 MED ORDER — DEXMEDETOMIDINE HCL IN NACL 80 MCG/20ML IV SOLN
INTRAVENOUS | Status: DC | PRN
  Administered 2022-02-07: 4 ug via BUCCAL
  Administered 2022-02-07: 12 ug via BUCCAL
  Administered 2022-02-07: 4 ug via BUCCAL

## 2022-02-07 MED ORDER — ACETAMINOPHEN 500 MG PO TABS: 500.0000 mg | ORAL_TABLET | Freq: Four times a day (QID) | ORAL | 0 refills | Status: DC | PRN

## 2022-02-07 MED ORDER — PHENYLEPHRINE 80 MCG/ML (10ML) SYRINGE FOR IV PUSH (FOR BLOOD PRESSURE SUPPORT)
PREFILLED_SYRINGE | INTRAVENOUS | Status: AC
  Filled 2022-02-07: qty 10

## 2022-02-07 MED ORDER — SUGAMMADEX SODIUM 200 MG/2ML IV SOLN
INTRAVENOUS | Status: DC | PRN
  Administered 2022-02-07: 200 mg via INTRAVENOUS
  Administered 2022-02-07 (×2): 100 mg via INTRAVENOUS

## 2022-02-07 MED ORDER — OXYCODONE HCL 5 MG PO TABS: 5.0000 mg | ORAL_TABLET | ORAL | 0 refills | Status: DC | PRN

## 2022-02-07 MED ORDER — PHENYLEPHRINE HCL (PRESSORS) 10 MG/ML IV SOLN
INTRAVENOUS | Status: DC | PRN
  Administered 2022-02-07 (×2): 80 ug via INTRAVENOUS
  Administered 2022-02-07 (×2): 160 ug via INTRAVENOUS
  Administered 2022-02-07 (×2): 80 ug via INTRAVENOUS

## 2022-02-07 MED ORDER — FENTANYL CITRATE (PF) 100 MCG/2ML IJ SOLN
INTRAMUSCULAR | Status: AC
  Filled 2022-02-07: qty 2

## 2022-02-07 MED ORDER — MIDAZOLAM HCL 2 MG/2ML IJ SOLN
INTRAMUSCULAR | Status: AC
  Filled 2022-02-07: qty 2

## 2022-02-07 MED ORDER — POVIDONE-IODINE 10 % EX SWAB: 2.0000 | Freq: Once | CUTANEOUS | Status: DC

## 2022-02-07 MED ORDER — ROCURONIUM BROMIDE 100 MG/10ML IV SOLN: INTRAVENOUS | Status: DC | PRN

## 2022-02-07 MED ORDER — GLYCOPYRROLATE PF 0.2 MG/ML IJ SOSY
PREFILLED_SYRINGE | INTRAMUSCULAR | Status: AC
  Filled 2022-02-07: qty 1

## 2022-02-07 MED ORDER — ONDANSETRON HCL 4 MG/2ML IJ SOLN
INTRAMUSCULAR | Status: DC | PRN
  Administered 2022-02-07: 4 mg via INTRAVENOUS

## 2022-02-07 MED ORDER — GLYCOPYRROLATE 0.2 MG/ML IJ SOLN
INTRAMUSCULAR | Status: DC | PRN
  Administered 2022-02-07: .1 mg via INTRAVENOUS

## 2022-02-07 MED ORDER — IBUPROFEN 800 MG PO TABS: 800.0000 mg | ORAL_TABLET | Freq: Three times a day (TID) | ORAL | 0 refills | Status: DC | PRN

## 2022-02-07 MED ORDER — PROPOFOL 10 MG/ML IV BOLUS
INTRAVENOUS | Status: DC | PRN
  Administered 2022-02-07: 200 mg via INTRAVENOUS

## 2022-02-07 MED ORDER — LIDOCAINE HCL (PF) 2 % IJ SOLN
INTRAMUSCULAR | Status: AC
  Filled 2022-02-07: qty 5

## 2022-02-07 MED ORDER — DEXAMETHASONE SODIUM PHOSPHATE 4 MG/ML IJ SOLN
INTRAMUSCULAR | Status: DC | PRN
  Administered 2022-02-07: 10 mg via INTRAVENOUS

## 2022-02-07 SURGICAL SUPPLY — 25 items
ADH SKN CLS APL DERMABOND .7 (GAUZE/BANDAGES/DRESSINGS) ×1
COVER MAYO STAND STRL (DRAPES) ×1 IMPLANT
DERMABOND ADVANCED .7 DNX12 (GAUZE/BANDAGES/DRESSINGS) ×1 IMPLANT
DRAPE STERI URO 9X17 APER PCH (DRAPES) ×1 IMPLANT
DURAPREP 26ML APPLICATOR (WOUND CARE) ×1 IMPLANT
GLOVE BIO SURGEON STRL SZ 6 (GLOVE) ×1 IMPLANT
GOWN STRL REUS W/TWL LRG LVL3 (GOWN DISPOSABLE) ×1 IMPLANT
IRRIG SUCT STRYKERFLOW 2 WTIP (MISCELLANEOUS)
IRRIGATION SUCT STRKRFLW 2 WTP (MISCELLANEOUS) IMPLANT
KIT PINK PAD W/HEAD ARE REST (MISCELLANEOUS) ×1
KIT PINK PAD W/HEAD ARM REST (MISCELLANEOUS) ×1 IMPLANT
KIT TURNOVER CYSTO (KITS) ×1 IMPLANT
NDL INSUFFLATION 14GA 120MM (NEEDLE) ×1 IMPLANT
NEEDLE INSUFFLATION 14GA 120MM (NEEDLE) ×1 IMPLANT
PACK LAPAROSCOPY BASIN (CUSTOM PROCEDURE TRAY) ×1 IMPLANT
SEALER TISSUE G2 CVD JAW 35 (ENDOMECHANICALS) ×1 IMPLANT
SEALER TISSUE G2 CVD JAW 45CM (ENDOMECHANICALS) ×1
SET IRRIG Y TYPE TUR BLADDER L (SET/KITS/TRAYS/PACK) IMPLANT
SET TUBE SMOKE EVAC HIGH FLOW (TUBING) ×1 IMPLANT
SLEEVE Z-THREAD 5X100MM (TROCAR) ×1 IMPLANT
SUT VICRYL 4-0 PS2 18IN ABS (SUTURE) ×1 IMPLANT
TOWEL OR 17X26 10 PK STRL BLUE (TOWEL DISPOSABLE) ×1 IMPLANT
TROCAR KII 8X100ML NONTHREADED (TROCAR) IMPLANT
TROCAR Z-THREAD FIOS 5X100MM (TROCAR) ×1 IMPLANT
WARMER LAPAROSCOPE (MISCELLANEOUS) ×1 IMPLANT

## 2022-02-07 NOTE — Anesthesia Postprocedure Evaluation (Signed)
Anesthesia Post Note  Patient: Tasha Barrett  Procedure(s) Performed: LAPAROSCOPIC BILATERAL SALPINGECTOMY (Bilateral: Abdomen)     Patient location during evaluation: PACU Anesthesia Type: General Level of consciousness: awake and alert Pain management: pain level controlled Vital Signs Assessment: post-procedure vital signs reviewed and stable Respiratory status: spontaneous breathing, nonlabored ventilation, respiratory function stable and patient connected to nasal cannula oxygen Cardiovascular status: blood pressure returned to baseline and stable Postop Assessment: no apparent nausea or vomiting Anesthetic complications: no  No notable events documented.  Last Vitals:  Vitals:   02/07/22 1430 02/07/22 1445  BP: (!) 140/77 126/72  Pulse: 73 74  Resp: 14 15  Temp:    SpO2: 91% 92%    Last Pain:  Vitals:   02/07/22 1430  TempSrc:   PainSc: Asleep                 Barnet Glasgow

## 2022-02-07 NOTE — Discharge Instructions (Signed)
No acetaminophen/Tylenol until after 4:30 pm today if needed.  No ibuprofen, Advil, Aleve, Motrin, ketorolac, meloxicam, naproxen, or other NSAIDS until after 7:30 pm today if needed.     Post Anesthesia Home Care Instructions  Activity: Get plenty of rest for the remainder of the day. A responsible individual must stay with you for 24 hours following the procedure.  For the next 24 hours, DO NOT: -Drive a car -Paediatric nurse -Drink alcoholic beverages -Take any medication unless instructed by your physician -Make any legal decisions or sign important papers.  Meals: Start with liquid foods such as gelatin or soup. Progress to regular foods as tolerated. Avoid greasy, spicy, heavy foods. If nausea and/or vomiting occur, drink only clear liquids until the nausea and/or vomiting subsides. Call your physician if vomiting continues.  Special Instructions/Symptoms: Your throat may feel dry or sore from the anesthesia or the breathing tube placed in your throat during surgery. If this causes discomfort, gargle with warm salt water. The discomfort should disappear within 24 hours.
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
## 2022-02-07 NOTE — Transfer of Care (Cosign Needed)
Immediate Anesthesia Transfer of Care Note  Patient: Tasha Barrett  Procedure(s) Performed: LAPAROSCOPIC BILATERAL SALPINGECTOMY (Bilateral: Abdomen)  Patient Location: PACU  Anesthesia Type:General  Level of Consciousness: awake, alert , and oriented  Airway & Oxygen Therapy: Patient Spontanous Breathing and Patient connected to nasal cannula oxygen  Post-op Assessment: Report given to RN, Post -op Vital signs reviewed and stable, and Patient moving all extremities  Post vital signs: Reviewed and stable  Last Vitals:  Vitals Value Taken Time  BP 132/54 02/07/22 1350  Temp    Pulse 84 02/07/22 1352  Resp 15 02/07/22 1352  SpO2 97 % 02/07/22 1352  Vitals shown include unvalidated device data.  Last Pain:  Vitals:   02/07/22 0955  TempSrc: Oral         Complications: No notable events documented.

## 2022-02-07 NOTE — Op Note (Signed)
Tasha Barrett PROCEDURE DATE: 02/07/2022   PREOPERATIVE DIAGNOSIS:  Undesired fertility  POSTOPERATIVE DIAGNOSIS:  Undesired fertility  PROCEDURE:  Laparoscopic Bilateral Salpingectomy   SURGEON:  Dr. Radene Gunning  ASSISTANT:  None  ANESTHESIA:  General endotracheal  COMPLICATIONS:  None immediate.  ESTIMATED BLOOD LOSS:  5 ml.  FLUIDS: 1000 ml LR.  URINE OUTPUT:  100 ml of clear urine.  INDICATIONS: 32 y.o. G1P1001 with undesired fertility, desires permanent sterilization.  Other forms of contraception were discussed with patient and emphasized alternatives of vasectomy, IUDs and Nexplanon as they have equivalent contraceptive efficacy; she declines all other modalities.  Risks of procedure discussed with patient including permanence of method, risk of regret, bleeding, infection, injury to surrounding organs and need for additional procedures including laparotomy.  Failure risk less than 0.5% with increased risk of ectopic gestation if pregnancy occurs was also discussed with patient.  Written informed consent was obtained.    FINDINGS:  Normal fallopian tubes, and ovaries. The uterus was noted to have multiple uterine fibroids. She is considering [name] but concern by radiology about attachment. Photos taken and message sent to IR specialist for their review.   TECHNIQUE:  The patient was taken to the operating room where general anesthesia was obtained without difficulty.  She was then placed in the dorsal supine position and prepared and draped in sterile fashion.  An adequate time out was performed.   A skin incision was made with the 11 blade scalpel in the umbilicus. I entered her abdominal cavity through the natural umbilical defect with a Claiborne Billings. S retractor was placed and confirmed to be intraabdominal. Port was placed under direct visualization. Opening pressure was 5.  Pneumoperitoneum achieved to a pressure of 15. Below the point of entry and the pelvis was inspected with no  evidence of injury.   The scalpel was then used to make one 89m incsion in the LLQ and one 8 mm incision suprapubically. Optiview ports were inserted. The ureters were identified bilaterally. The fallopian tubes were cauterized, cut, and detached from their surrounding pelvic structures with the enseal. No bleeding was noted. The specimens were removed from the 830mport. The pelvis was inspected and was hemostatic. All ports were then withdrawn and the gas drained from abdomen. They were then closed in a subcuticular fashion with 4-0 vicryl and dressings were placed.   The patient will be discharged to home as per PACU criteria.  Routine postoperative instructions given.    PaRadene GunningMD Attending [facility]

## 2022-02-07 NOTE — Anesthesia Procedure Notes (Cosign Needed)
Procedure Name: Intubation Date/Time: 02/07/2022 12:55 PM  Performed by: Barnet Glasgow, MDPre-anesthesia Checklist: Patient identified, Emergency Drugs available, Suction available and Patient being monitored Patient Re-evaluated:Patient Re-evaluated prior to induction Oxygen Delivery Method: Circle system utilized Preoxygenation: Pre-oxygenation with 100% oxygen Induction Type: IV induction Ventilation: Mask ventilation without difficulty Laryngoscope Size: Mac and 3 Grade View: Grade II Tube type: Oral Number of attempts: 1 Airway Equipment and Method: Stylet and Oral airway Placement Confirmation: ETT inserted through vocal cords under direct vision, positive ETCO2 and breath sounds checked- equal and bilateral Secured at: 23 cm Tube secured with: Tape Dental Injury: Teeth and Oropharynx as per pre-operative assessment

## 2022-02-08 ENCOUNTER — Encounter (HOSPITAL_BASED_OUTPATIENT_CLINIC_OR_DEPARTMENT_OTHER): Payer: Self-pay | Admitting: Obstetrics and Gynecology

## 2022-02-10 ENCOUNTER — Other Ambulatory Visit: Payer: Commercial Managed Care - HMO

## 2022-02-11 LAB — SURGICAL PATHOLOGY

## 2022-02-12 ENCOUNTER — Encounter: Payer: Self-pay | Admitting: Obstetrics and Gynecology

## 2022-02-13 ENCOUNTER — Other Ambulatory Visit: Payer: Self-pay

## 2022-02-13 ENCOUNTER — Emergency Department (HOSPITAL_BASED_OUTPATIENT_CLINIC_OR_DEPARTMENT_OTHER)
Admission: EM | Admit: 2022-02-13 | Discharge: 2022-02-13 | Disposition: A | Discharge status: Home / Self Care | Payer: Medicaid Other | Attending: Emergency Medicine | Admitting: Emergency Medicine

## 2022-02-13 ENCOUNTER — Emergency Department (HOSPITAL_BASED_OUTPATIENT_CLINIC_OR_DEPARTMENT_OTHER): Payer: Medicaid Other | Admitting: Radiology

## 2022-02-13 ENCOUNTER — Emergency Department (HOSPITAL_BASED_OUTPATIENT_CLINIC_OR_DEPARTMENT_OTHER): Payer: Medicaid Other

## 2022-02-13 ENCOUNTER — Encounter (HOSPITAL_BASED_OUTPATIENT_CLINIC_OR_DEPARTMENT_OTHER): Payer: Self-pay | Admitting: Emergency Medicine

## 2022-02-13 DIAGNOSIS — R0602 Shortness of breath: Secondary | ICD-10-CM | POA: Diagnosis not present

## 2022-02-13 DIAGNOSIS — R0789 Other chest pain: Secondary | ICD-10-CM | POA: Diagnosis present

## 2022-02-13 DIAGNOSIS — Z91040 Latex allergy status: Secondary | ICD-10-CM | POA: Insufficient documentation

## 2022-02-13 DIAGNOSIS — F411 Generalized anxiety disorder: Secondary | ICD-10-CM | POA: Diagnosis not present

## 2022-02-13 DIAGNOSIS — R0781 Pleurodynia: Secondary | ICD-10-CM | POA: Diagnosis not present

## 2022-02-13 DIAGNOSIS — R7989 Other specified abnormal findings of blood chemistry: Secondary | ICD-10-CM | POA: Diagnosis not present

## 2022-02-13 DIAGNOSIS — R079 Chest pain, unspecified: Secondary | ICD-10-CM | POA: Diagnosis not present

## 2022-02-13 LAB — BASIC METABOLIC PANEL
Anion gap: 9 (ref 5–15)
BUN: 10 mg/dL (ref 6–20)
CO2: 25 mmol/L (ref 22–32)
Calcium: 9 mg/dL (ref 8.9–10.3)
Chloride: 102 mmol/L (ref 98–111)
Creatinine, Ser: 0.85 mg/dL (ref 0.44–1.00)
GFR, Estimated: >60 mL/min (ref >60)
Glucose, Bld: 90 mg/dL (ref 70–99)
Potassium: 3.9 mmol/L (ref 3.5–5.1)
Sodium: 136 mmol/L (ref 135–145)

## 2022-02-13 LAB — CBC
HCT: 34.4 % — ABNORMAL LOW (ref 36.0–46.0)
Hemoglobin: 10.4 g/dL — ABNORMAL LOW (ref 12.0–15.0)
MCH: 24.8 pg — ABNORMAL LOW (ref 26.0–34.0)
MCHC: 30.2 g/dL (ref 30.0–36.0)
MCV: 81.9 fL (ref 80.0–100.0)
Platelets: 291 10*3/uL (ref 150–400)
RBC: 4.2 MIL/uL (ref 3.87–5.11)
RDW: 15.3 % (ref 11.5–15.5)
WBC: 10.6 10*3/uL — ABNORMAL HIGH (ref 4.0–10.5)
nRBC: 0 % (ref 0.0–0.2)

## 2022-02-13 LAB — D-DIMER, QUANTITATIVE: D-Dimer, Quant: 0.73 ug/mL-FEU — ABNORMAL HIGH (ref 0.00–0.50)

## 2022-02-13 MED ORDER — IOHEXOL 350 MG/ML SOLN
100.0000 mL | Freq: Once | INTRAVENOUS | Status: AC | PRN
  Administered 2022-02-13: 75 mL via INTRAVENOUS

## 2022-02-13 NOTE — ED Notes (Addendum)
Had a tubal Lig last wed now having pain  in rt shoulder and hurts to take a deep breath ,brought her neighbors daughter to the hospital because she fell and has a lac to her rt eye brow, so she decided to come and get her pain checked out also

## 2022-02-13 NOTE — ED Provider Notes (Signed)
[place] EMERGENCY DEPT Provider Note   CSN: [id number] Arrival date & time: 02/13/22  1618     History  Chief Complaint  Patient presents with   Shortness of Breath    Geniece Akers is a 32 y.o. female.   Shortness of Breath    Patient presents to the ED for evaluation of chest discomfort.  Patient had a tubal ligation last week.  She has noted some intermittent sharp pain on the right side of her chest that radiates to her shoulder whenever she takes a deep breath for the last couple days.  Patient denies any fevers or chills.  No coughing.  No abdominal pain.  No vomiting or diarrhea.  Home Medications Prior to Admission medications   Medication Sig Start Date End Date Taking? Authorizing Provider  acetaminophen (TYLENOL) 500 MG tablet Take 1 tablet (500 mg total) by mouth every 6 (six) hours as needed. 02/07/22   Radene Gunning, MD  ferrous sulfate 325 (65 FE) MG tablet Take 325 mg by mouth daily with breakfast.    [provider]  ibuprofen (ADVIL) 800 MG tablet Take 1 tablet (800 mg total) by mouth 3 (three) times daily with meals as needed for headache, moderate pain or cramping. 02/07/22   Radene Gunning, MD  oxyCODONE (OXY IR/ROXICODONE) 5 MG immediate release tablet Take 1 tablet (5 mg total) by mouth every 4 (four) hours as needed for severe pain or breakthrough pain. 02/07/22   Radene Gunning, MD  propranolol Alphia Moh) 20 MG/5ML solution  09/05/21   [provider]  venlafaxine XR (EFFEXOR-XR) 75 MG 24 hr capsule at bedtime.    [provider]      Allergies    Amoxicillin-pot clavulanate and Latex    Review of Systems   Review of Systems  Respiratory:  Positive for shortness of breath.     Physical Exam Updated Vital Signs BP 132/77 (BP Location: Right Arm)   Pulse 88   Temp 97.9 F (36.6 C)   Resp 16   SpO2 100%  Physical Exam Vitals and nursing note reviewed.  Constitutional:      General: She is not in acute  distress.    Appearance: She is well-developed.  HENT:     Head: Normocephalic and atraumatic.     Right Ear: External ear normal.     Left Ear: External ear normal.  Eyes:     General: No scleral icterus.       Right eye: No discharge.        Left eye: No discharge.     Conjunctiva/sclera: Conjunctivae normal.  Neck:     Trachea: No tracheal deviation.  Cardiovascular:     Rate and Rhythm: Normal rate and regular rhythm.  Pulmonary:     Effort: Pulmonary effort is normal. No respiratory distress.     Breath sounds: Normal breath sounds. No stridor. No wheezing or rales.  Abdominal:     General: Bowel sounds are normal. There is no distension.     Palpations: Abdomen is soft.     Tenderness: There is no abdominal tenderness. There is no guarding or rebound.  Musculoskeletal:        General: No tenderness or deformity.     Cervical back: Neck supple.  Skin:    General: Skin is warm and dry.     Findings: No rash.  Neurological:     General: No focal deficit present.     Mental Status: She is alert.  Cranial Nerves: No cranial nerve deficit, dysarthria or facial asymmetry.     Sensory: No sensory deficit.     Motor: No abnormal muscle tone or seizure activity.     Coordination: Coordination normal.  Psychiatric:        Mood and Affect: Mood normal.     ED Results / Procedures / Treatments   Labs (all labs ordered are listed, but only abnormal results are displayed) Labs Reviewed  CBC - Abnormal; Notable for the following components:      Result Value   WBC 10.6 (*)    Hemoglobin 10.4 (*)    HCT 34.4 (*)    MCH 24.8 (*)    All other components within normal limits  D-DIMER, QUANTITATIVE - Abnormal; Notable for the following components:   D-Dimer, Quant 0.73 (*)    All other components within normal limits  BASIC METABOLIC PANEL    EKG EKG Interpretation  Date/Time:  Tuesday February 13 2022 16:56:11 EST Ventricular Rate:  98 PR Interval:  120 QRS  Duration: 74 QT Interval:  352 QTC Calculation: 449 R Axis:   85 Text Interpretation: Normal sinus rhythm with sinus arrhythmia Normal ECG Confirmed by Dorie Rank [phone]) on 02/13/2022 5:01:42 PM  Radiology CT Angio Chest PE W and/or Wo Contrast  Result Date: 02/13/2022 CLINICAL DATA:  Positive D-dimer.  Chest pain with breathing. EXAM: CT ANGIOGRAPHY CHEST WITH CONTRAST TECHNIQUE: Multidetector CT imaging of the chest was performed using the standard protocol during bolus administration of intravenous contrast. Multiplanar CT image reconstructions and MIPs were obtained to evaluate the vascular anatomy. RADIATION DOSE REDUCTION: This exam was performed according to the departmental dose-optimization program which includes automated exposure control, adjustment of the mA and/or kV according to patient size and/or use of iterative reconstruction technique. CONTRAST:  38m OMNIPAQUE IOHEXOL 350 MG/ML SOLN COMPARISON:  None Available. FINDINGS: Cardiovascular: Satisfactory opacification of the pulmonary arteries to the segmental level. No evidence of pulmonary embolism. Normal heart size. No pericardial effusion. Mediastinum/Nodes: No enlarged mediastinal, hilar, or axillary lymph nodes. Thyroid gland, trachea, and esophagus demonstrate no significant findings. Residual thymic tissue is noted in the anterior mediastinum. Lungs/Pleura: Lungs are clear. No pleural effusion or pneumothorax. Upper Abdomen: No acute abnormality. Musculoskeletal: No chest wall abnormality. No acute or significant osseous findings. Review of the MIP images confirms the above findings. IMPRESSION: 1. No evidence of pulmonary embolism or other acute abnormality in the chest. Electronically Signed   By: ARonney AstersM.D.   On: 02/13/2022 20:02   DG Chest 2 View  Result Date: 02/13/2022 CLINICAL DATA:  Short of breath EXAM: CHEST - 2 VIEW COMPARISON:  None Available. FINDINGS: The heart size and mediastinal contours are within normal  limits. Both lungs are clear. The visualized skeletal structures are unremarkable. IMPRESSION: No active cardiopulmonary disease. Electronically Signed   By: HJacqulynn CadetM.D.   On: 02/13/2022 16:56    Procedures Procedures    Medications Ordered in ED Medications  iohexol (OMNIPAQUE) 350 MG/ML injection 100 mL (75 mLs Intravenous Contrast Given 02/13/22 1930)    ED Course/ Medical Decision Making/ A&P Clinical Course as of 02/13/22 2048  Tue Feb 13, 2022  1911 D-dimer, quantitative(!) D-dimer elevated 0.73 [JK]  2031 CT angio without PE.  CBC and [place] panel unremarkable, anemia noted but similar to previous values [JK]    Clinical Course User Index [JK] KDorie Rank MD  Medical Decision Making Problems Addressed: Pleuritic chest pain: acute illness or injury that poses a threat to life or bodily functions  Amount and/or Complexity of Data Reviewed Labs: ordered. Decision-making details documented in ED Course. Radiology: ordered and independent interpretation performed.  Risk Prescription drug management.   Patient presented to the ED for evaluation of chest pain after recent surgery.  Symptoms were pleuritic in nature.  Chest x-ray did not show evidence of pneumonia or pneumothorax.  With her recent surgery D-dimer was ordered and it was slightly elevated.  Fortunately her CT scan does not show any evidence of PE.  Evaluation and diagnostic testing in the emergency department does not suggest an emergent condition requiring admission or immediate intervention beyond what has been performed at this time.  The patient is safe for discharge and has been instructed to return immediately for worsening symptoms, change in symptoms or any other concerns.        Final Clinical Impression(s) / ED Diagnoses Final diagnoses:  Pleuritic chest pain    Rx / DC Orders ED Discharge Orders     None         Dorie Rank, MD 02/13/22 2048

## 2022-02-13 NOTE — ED Notes (Signed)
RN provided AVS using Teachback Method. Patient verbalizes understanding of Discharge Instructions. Opportunity for Questioning and Answers were provided by RN. Patient Discharged from ED ambulatory to Home via Self.  

## 2022-02-13 NOTE — Discharge Instructions (Signed)
The CT scan did not show any evidence of pulmonary embolism.  Take over-the-counter medications as needed for pain.  Follow-up with your doctor to be rechecked if you have any recurrent symptoms.

## 2022-02-13 NOTE — ED Triage Notes (Signed)
Surgery on Wednesday. Some sob and pain in chest when taking deep breath

## 2022-02-14 ENCOUNTER — Other Ambulatory Visit (HOSPITAL_COMMUNITY): Payer: Self-pay | Admitting: Interventional Radiology

## 2022-02-14 DIAGNOSIS — R102 Pelvic and perineal pain: Secondary | ICD-10-CM

## 2022-02-14 DIAGNOSIS — D259 Leiomyoma of uterus, unspecified: Secondary | ICD-10-CM

## 2022-02-16 ENCOUNTER — Telehealth: Payer: Self-pay | Admitting: *Deleted

## 2022-02-16 ENCOUNTER — Telehealth: Payer: Self-pay | Admitting: General Practice

## 2022-02-16 NOTE — Telephone Encounter (Signed)
Transition Care Management Follow-up Telephone Call Date of discharge and from where: 02/13/22 from [facility] How have you been since you were released from the hospital? Doing much better.  Any questions or concerns? No  Items Reviewed: Did the pt receive and understand the discharge instructions provided? Yes  Medications obtained and verified? No  Other? No  Any new allergies since your discharge? No  Dietary orders reviewed? Yes Do you have support at home? Yes   Home Care and Equipment/Supplies: Were home health services ordered? no   Functional Questionnaire: (I = Independent and D = Dependent) ADLs: I  Bathing/Dressing- I  Meal Prep- I  Eating- I  Maintaining continence- I  Transferring/Ambulation- I  Managing Meds- I  Follow up appointments reviewed:  PCP Hospital f/u appt confirmed? Yes Scheduled to see Samuel Bouche, NP on 03/20/22.  [facility] f/u appt confirmed? No   Are transportation arrangements needed? No  If their condition worsens, is the pt aware to call PCP or go to the Emergency Dept.? Yes Was the patient provided with contact information for the PCP's office or ED? Yes Was to pt encouraged to call back with questions or concerns? Yes

## 2022-02-16 NOTE — Telephone Encounter (Signed)
Patient is not able to schedule Post Op appointment at this time due to being at work. Patient advised that she will be called back next week to schedule. Patient agreed.

## 2022-02-21 ENCOUNTER — Other Ambulatory Visit (HOSPITAL_COMMUNITY)
Admission: RE | Admit: 2022-02-21 | Discharge: 2022-02-21 | Disposition: A | Discharge status: Home / Self Care | Payer: Medicaid Other | Source: Ambulatory Visit

## 2022-02-21 ENCOUNTER — Ambulatory Visit: Payer: Medicaid Other

## 2022-02-21 VITALS — BP 116/80 | HR 97 | Ht 62.0 in | Wt 234.0 lb

## 2022-02-21 DIAGNOSIS — R8290 Unspecified abnormal findings in urine: Secondary | ICD-10-CM

## 2022-02-21 DIAGNOSIS — Z0000 Encounter for general adult medical examination without abnormal findings: Secondary | ICD-10-CM | POA: Diagnosis not present

## 2022-02-21 DIAGNOSIS — Z113 Encounter for screening for infections with a predominantly sexual mode of transmission: Secondary | ICD-10-CM

## 2022-02-21 NOTE — Progress Notes (Signed)
   Subjective:     Tasha Barrett is a 32 y.o. female here at [facility] for a routine exam.  Current complaints: foul-smelling urine. No burning or pain. No hematuria. Requesting STD testing. Personal health questionnaire reviewed: yes.   [place] Office Visit from 11/08/2021 in [place]  PHQ-2 Total Score 2       Health Maintenance Due  Topic Date Due   COVID-19 Vaccine (4 - 2023-24 season) 10/06/2021    Risk factors for chronic health problems: Smoking: denies Alchohol/how much: socially Illicit drug use: denies Pt BMI: Body mass index is 42.8 kg/m.   Gynecologic History Patient's last menstrual period was 02/10/2022. Contraception: tubal ligation Sexual health: Not currently sexually active but had 1 new partner several months ago Last Pap: 08/30/2020. Results were: normal Last mammogram: n/a  Obstetric History OB History  Gravida Para Term Preterm AB Living  '1 1 1     1  '$ SAB IAB Ectopic Multiple Live Births        0 1    # Outcome Date GA Lbr Len/2nd Weight Sex Delivery Anes PTL Lv  1 Term 03/09/14 [redacted]w[redacted]d 7 lb 10.1 oz (3.46 kg) F CS-LTranv EPI  LIV   The following portions of the patient's history were reviewed and updated as appropriate: allergies, current medications, past family history, past medical history, past social history, past surgical history, and problem list.  Review of Systems Pertinent items are noted in HPI.    Objective:   VS reviewed, nursing note reviewed,  Constitutional: well developed, well nourished, no distress HEENT: normocephalic, thyroid without enlargement or mass HEART: RRR, no murmurs rubs/gallops RESP: clear and equal to auscultation bilaterally in all lobes  Breast Exam: right breast normal without mass, skin or nipple changes or axillary nodes, left breast normal without mass, skin or nipple changes or axillary nodes Abdomen: soft Neuro: alert and oriented x 3 Skin: warm, dry Psych:  affect normal Pelvic exam: blind swab collected   Assessment/Plan:   1. Well woman exam without gynecological exam - Pap up to date  2. Screen for STD (sexually transmitted disease)  - Cervicovaginal ancillary only( [place]) - Hepatitis C Antibody - Hepatitis B Surface AntiGEN - RPR - HIV antibody (with reflex)  3. Foul smelling urine  - Urine Culture   No follow-ups on file.   DRenee Harder CNM 3:06 PM

## 2022-02-22 ENCOUNTER — Other Ambulatory Visit: Payer: Self-pay

## 2022-02-22 DIAGNOSIS — F411 Generalized anxiety disorder: Secondary | ICD-10-CM | POA: Diagnosis not present

## 2022-02-22 DIAGNOSIS — B9689 Other specified bacterial agents as the cause of diseases classified elsewhere: Secondary | ICD-10-CM

## 2022-02-22 LAB — CERVICOVAGINAL ANCILLARY ONLY
Bacterial Vaginitis (gardnerella): POSITIVE — AB
Candida Glabrata: NEGATIVE
Candida Vaginitis: NEGATIVE
Chlamydia: NEGATIVE
Comment: NEGATIVE
Comment: NEGATIVE
Comment: NEGATIVE
Comment: NEGATIVE
Comment: NEGATIVE
Comment: NORMAL
Neisseria Gonorrhea: NEGATIVE
Trichomonas: NEGATIVE

## 2022-02-22 LAB — HEPATITIS B SURFACE ANTIGEN: Hepatitis B Surface Ag: NEGATIVE

## 2022-02-22 LAB — RPR: RPR Ser Ql: NONREACTIVE

## 2022-02-22 LAB — HIV ANTIBODY (ROUTINE TESTING W REFLEX): HIV Screen 4th Generation wRfx: NONREACTIVE

## 2022-02-22 LAB — HEPATITIS C ANTIBODY: Hep C Virus Ab: NONREACTIVE

## 2022-02-22 MED ORDER — METRONIDAZOLE 500 MG PO TABS: 500.0000 mg | ORAL_TABLET | Freq: Two times a day (BID) | ORAL | 0 refills | Status: DC

## 2022-02-23 LAB — URINE CULTURE

## 2022-02-23 LAB — SPECIMEN STATUS REPORT

## 2022-02-27 ENCOUNTER — Other Ambulatory Visit: Payer: Self-pay | Admitting: Radiology

## 2022-02-27 DIAGNOSIS — D219 Benign neoplasm of connective and other soft tissue, unspecified: Secondary | ICD-10-CM

## 2022-02-28 ENCOUNTER — Other Ambulatory Visit (HOSPITAL_COMMUNITY): Payer: Self-pay | Admitting: Interventional Radiology

## 2022-02-28 ENCOUNTER — Observation Stay (HOSPITAL_COMMUNITY)
Admission: RE | Admit: 2022-02-28 | Discharge: 2022-03-01 | Disposition: A | Discharge status: Home / Self Care | Payer: Medicaid Other | Source: Ambulatory Visit | Attending: Interventional Radiology | Admitting: Interventional Radiology

## 2022-02-28 ENCOUNTER — Ambulatory Visit (HOSPITAL_COMMUNITY)
Admission: RE | Admit: 2022-02-28 | Discharge: 2022-02-28 | Disposition: A | Discharge status: Home / Self Care | Payer: Medicaid Other | Source: Ambulatory Visit

## 2022-02-28 ENCOUNTER — Encounter (HOSPITAL_COMMUNITY): Payer: Self-pay

## 2022-02-28 ENCOUNTER — Other Ambulatory Visit: Payer: Self-pay

## 2022-02-28 DIAGNOSIS — D259 Leiomyoma of uterus, unspecified: Secondary | ICD-10-CM | POA: Diagnosis not present

## 2022-02-28 DIAGNOSIS — R102 Pelvic and perineal pain: Secondary | ICD-10-CM

## 2022-02-28 DIAGNOSIS — Z8616 Personal history of COVID-19: Secondary | ICD-10-CM | POA: Insufficient documentation

## 2022-02-28 DIAGNOSIS — D219 Benign neoplasm of connective and other soft tissue, unspecified: Secondary | ICD-10-CM

## 2022-02-28 DIAGNOSIS — Z87891 Personal history of nicotine dependence: Secondary | ICD-10-CM | POA: Diagnosis not present

## 2022-02-28 HISTORY — PX: IR US GUIDE VASC ACCESS LEFT: IMG2389

## 2022-02-28 HISTORY — PX: IR ANGIOGRAM PELVIS SELECTIVE OR SUPRASELECTIVE: IMG661

## 2022-02-28 HISTORY — PX: IR ANGIOGRAM SELECTIVE EACH ADDITIONAL VESSEL: IMG667

## 2022-02-28 HISTORY — PX: IR EMBO TUMOR ORGAN ISCHEMIA INFARCT INC GUIDE ROADMAPPING: IMG5449

## 2022-02-28 LAB — CBC WITH DIFFERENTIAL/PLATELET
Abs Immature Granulocytes: 0.01 10*3/uL (ref 0.00–0.07)
Basophils Absolute: 0 10*3/uL (ref 0.0–0.1)
Basophils Relative: 1 %
Eosinophils Absolute: 0.1 10*3/uL (ref 0.0–0.5)
Eosinophils Relative: 2 %
HCT: 34.4 % — ABNORMAL LOW (ref 36.0–46.0)
Hemoglobin: 10.3 g/dL — ABNORMAL LOW (ref 12.0–15.0)
Immature Granulocytes: 0 %
Lymphocytes Relative: 30 %
Lymphs Abs: 2 10*3/uL (ref 0.7–4.0)
MCH: 24.6 pg — ABNORMAL LOW (ref 26.0–34.0)
MCHC: 29.9 g/dL — ABNORMAL LOW (ref 30.0–36.0)
MCV: 82.3 fL (ref 80.0–100.0)
Monocytes Absolute: 0.4 10*3/uL (ref 0.1–1.0)
Monocytes Relative: 6 %
Neutro Abs: 4 10*3/uL (ref 1.7–7.7)
Neutrophils Relative %: 61 %
Platelets: 274 10*3/uL (ref 150–400)
RBC: 4.18 MIL/uL (ref 3.87–5.11)
RDW: 15.2 % (ref 11.5–15.5)
WBC: 6.6 10*3/uL (ref 4.0–10.5)
nRBC: 0 % (ref 0.0–0.2)

## 2022-02-28 LAB — BASIC METABOLIC PANEL
Anion gap: 6 (ref 5–15)
BUN: 10 mg/dL (ref 6–20)
CO2: 25 mmol/L (ref 22–32)
Calcium: 8.5 mg/dL — ABNORMAL LOW (ref 8.9–10.3)
Chloride: 106 mmol/L (ref 98–111)
Creatinine, Ser: 0.82 mg/dL (ref 0.44–1.00)
GFR, Estimated: >60 mL/min (ref >60)
Glucose, Bld: 91 mg/dL (ref 70–99)
Potassium: 4.7 mmol/L (ref 3.5–5.1)
Sodium: 137 mmol/L (ref 135–145)

## 2022-02-28 LAB — PROTIME-INR
INR: 1.1 (ref 0.8–1.2)
Prothrombin Time: 13.7 seconds (ref 11.4–15.2)

## 2022-02-28 LAB — HCG, SERUM, QUALITATIVE: Preg, Serum: NEGATIVE

## 2022-02-28 MED ORDER — DEXAMETHASONE SODIUM PHOSPHATE 10 MG/ML IJ SOLN
8.0000 mg | Freq: Once | INTRAMUSCULAR | Status: AC
  Administered 2022-02-28: 8 mg via INTRAVENOUS
  Filled 2022-02-28: qty 1

## 2022-02-28 MED ORDER — FENTANYL CITRATE (PF) 100 MCG/2ML IJ SOLN
INTRAMUSCULAR | Status: AC
  Filled 2022-02-28: qty 2

## 2022-02-28 MED ORDER — NITROGLYCERIN 1 MG/10 ML FOR IR/CATH LAB
INTRA_ARTERIAL | Status: AC | PRN
  Administered 2022-02-28: 100 ug via INTRA_ARTERIAL

## 2022-02-28 MED ORDER — IOHEXOL 300 MG/ML  SOLN
100.0000 mL | Freq: Once | INTRAMUSCULAR | Status: AC | PRN
  Administered 2022-02-28: 30 mL via INTRA_ARTERIAL

## 2022-02-28 MED ORDER — SODIUM CHLORIDE 0.9 % IV SOLN: 250.0000 mL | INTRAVENOUS | Status: DC | PRN

## 2022-02-28 MED ORDER — LACTATED RINGERS IV BOLUS
1000.0000 mL | Freq: Once | INTRAVENOUS | Status: AC
  Administered 2022-02-28: 1000 mL via INTRAVENOUS

## 2022-02-28 MED ORDER — DIPHENHYDRAMINE HCL 50 MG/ML IJ SOLN
12.5000 mg | Freq: Four times a day (QID) | INTRAMUSCULAR | Status: DC | PRN
  Administered 2022-02-28 (×2): 12.5 mg via INTRAVENOUS
  Filled 2022-02-28 (×2): qty 1

## 2022-02-28 MED ORDER — KETOROLAC TROMETHAMINE 15 MG/ML IJ SOLN
15.0000 mg | Freq: Four times a day (QID) | INTRAMUSCULAR | Status: DC
  Administered 2022-03-01 (×2): 15 mg via INTRAVENOUS
  Filled 2022-02-28 (×2): qty 1

## 2022-02-28 MED ORDER — VERAPAMIL HCL 2.5 MG/ML IV SOLN
INTRAVENOUS | Status: AC | PRN
  Administered 2022-02-28: 2.5 mg via INTRA_ARTERIAL

## 2022-02-28 MED ORDER — VANCOMYCIN HCL IN DEXTROSE 1-5 GM/200ML-% IV SOLN
1000.0000 mg | Freq: Once | INTRAVENOUS | Status: AC
  Administered 2022-02-28: 1000 mg via INTRAVENOUS
  Filled 2022-02-28: qty 200

## 2022-02-28 MED ORDER — PROMETHAZINE HCL 25 MG RE SUPP: 25.0000 mg | Freq: Three times a day (TID) | RECTAL | Status: DC | PRN

## 2022-02-28 MED ORDER — SODIUM CHLORIDE 0.9 % IV SOLN: INTRAVENOUS | Status: DC

## 2022-02-28 MED ORDER — HYDROCODONE-ACETAMINOPHEN 5-325 MG PO TABS
1.0000 | ORAL_TABLET | ORAL | Status: DC | PRN
  Administered 2022-02-28 – 2022-03-01 (×2): 2 via ORAL
  Filled 2022-02-28 (×2): qty 2

## 2022-02-28 MED ORDER — ONDANSETRON HCL 4 MG/2ML IJ SOLN
4.0000 mg | Freq: Four times a day (QID) | INTRAMUSCULAR | Status: DC | PRN
  Administered 2022-02-28: 4 mg via INTRAVENOUS
  Filled 2022-02-28: qty 2

## 2022-02-28 MED ORDER — MIDAZOLAM HCL 2 MG/2ML IJ SOLN
INTRAMUSCULAR | Status: AC | PRN
  Administered 2022-02-28: .5 mg via INTRAVENOUS
  Administered 2022-02-28: 1.5 mg via INTRAVENOUS

## 2022-02-28 MED ORDER — VERAPAMIL HCL 2.5 MG/ML IV SOLN
INTRA_ARTERIAL | Status: AC | PRN
  Administered 2022-02-28: 6 mL via INTRA_ARTERIAL

## 2022-02-28 MED ORDER — DIPHENHYDRAMINE HCL 50 MG/ML IJ SOLN
INTRAMUSCULAR | Status: AC
  Filled 2022-02-28: qty 1

## 2022-02-28 MED ORDER — HYDROMORPHONE 1 MG/ML IV SOLN
INTRAVENOUS | Status: DC
  Administered 2022-02-28: 0.3 mg via INTRAVENOUS
  Administered 2022-02-28: 1.2 mg via INTRAVENOUS
  Administered 2022-03-01: 1.8 mg via INTRAVENOUS
  Administered 2022-03-01: 1.5 mg via INTRAVENOUS
  Administered 2022-03-01: 0.3 mg via INTRAVENOUS
  Filled 2022-02-28 (×11): qty 30

## 2022-02-28 MED ORDER — IBUPROFEN 800 MG PO TABS
800.0000 mg | ORAL_TABLET | Freq: Four times a day (QID) | ORAL | Status: DC
  Administered 2022-02-28: 800 mg via ORAL
  Filled 2022-02-28: qty 1

## 2022-02-28 MED ORDER — KETOROLAC TROMETHAMINE 30 MG/ML IJ SOLN
30.0000 mg | INTRAMUSCULAR | Status: AC
  Administered 2022-02-28: 30 mg via INTRAVENOUS
  Filled 2022-02-28: qty 1

## 2022-02-28 MED ORDER — LIDOCAINE HCL 1 % IJ SOLN
INTRAMUSCULAR | Status: AC
  Administered 2022-02-28: 2 mL via INTRADERMAL
  Filled 2022-02-28: qty 20

## 2022-02-28 MED ORDER — ONDANSETRON HCL 4 MG/2ML IJ SOLN
INTRAMUSCULAR | Status: AC | PRN
  Administered 2022-02-28: 4 mg via INTRAVENOUS

## 2022-02-28 MED ORDER — FENTANYL CITRATE (PF) 100 MCG/2ML IJ SOLN
INTRAMUSCULAR | Status: AC | PRN
  Administered 2022-02-28 (×2): 50 ug via INTRAVENOUS

## 2022-02-28 MED ORDER — HYDROMORPHONE HCL 2 MG/ML IJ SOLN
INTRAMUSCULAR | Status: AC
  Filled 2022-02-28: qty 1

## 2022-02-28 MED ORDER — HEPARIN SODIUM (PORCINE) 1000 UNIT/ML IJ SOLN
INTRAMUSCULAR | Status: AC
  Filled 2022-02-28: qty 10

## 2022-02-28 MED ORDER — SODIUM CHLORIDE 0.9% FLUSH: 9.0000 mL | INTRAVENOUS | Status: DC | PRN

## 2022-02-28 MED ORDER — ONDANSETRON HCL 4 MG/2ML IJ SOLN
INTRAMUSCULAR | Status: AC
  Filled 2022-02-28: qty 2

## 2022-02-28 MED ORDER — MIDAZOLAM HCL 2 MG/2ML IJ SOLN
INTRAMUSCULAR | Status: AC
  Filled 2022-02-28: qty 2

## 2022-02-28 MED ORDER — SODIUM CHLORIDE 0.9% FLUSH
3.0000 mL | Freq: Two times a day (BID) | INTRAVENOUS | Status: DC
  Administered 2022-03-01: 3 mL via INTRAVENOUS

## 2022-02-28 MED ORDER — ONDANSETRON HCL 4 MG/2ML IJ SOLN
4.0000 mg | INTRAMUSCULAR | Status: AC
  Administered 2022-02-28: 4 mg via INTRAVENOUS
  Filled 2022-02-28: qty 2

## 2022-02-28 MED ORDER — NITROGLYCERIN IN D5W 100-5 MCG/ML-% IV SOLN
INTRAVENOUS | Status: AC
  Filled 2022-02-28: qty 250

## 2022-02-28 MED ORDER — VERAPAMIL HCL 2.5 MG/ML IV SOLN
INTRAVENOUS | Status: AC
  Filled 2022-02-28: qty 2

## 2022-02-28 MED ORDER — DOCUSATE SODIUM 100 MG PO CAPS
100.0000 mg | ORAL_CAPSULE | Freq: Two times a day (BID) | ORAL | Status: DC
  Administered 2022-03-01: 100 mg via ORAL
  Filled 2022-02-28: qty 1

## 2022-02-28 MED ORDER — NALOXONE HCL 0.4 MG/ML IJ SOLN: 0.4000 mg | INTRAMUSCULAR | Status: DC | PRN

## 2022-02-28 MED ORDER — ONDANSETRON HCL 4 MG/2ML IJ SOLN: 4.0000 mg | Freq: Four times a day (QID) | INTRAMUSCULAR | Status: DC | PRN

## 2022-02-28 MED ORDER — SODIUM CHLORIDE 0.9% FLUSH: 3.0000 mL | INTRAVENOUS | Status: DC | PRN

## 2022-02-28 MED ORDER — PROMETHAZINE HCL 25 MG PO TABS
25.0000 mg | ORAL_TABLET | Freq: Three times a day (TID) | ORAL | Status: DC | PRN
  Administered 2022-02-28 – 2022-03-01 (×2): 25 mg via ORAL
  Filled 2022-02-28 (×2): qty 1

## 2022-02-28 MED ORDER — HYDROMORPHONE HCL 1 MG/ML IJ SOLN
INTRAMUSCULAR | Status: AC | PRN
  Administered 2022-02-28: 2 mg via INTRAVENOUS

## 2022-02-28 MED ORDER — DIPHENHYDRAMINE HCL 12.5 MG/5ML PO ELIX: 12.5000 mg | ORAL_SOLUTION | Freq: Four times a day (QID) | ORAL | Status: DC | PRN

## 2022-02-28 MED ORDER — KETOROLAC TROMETHAMINE 30 MG/ML IJ SOLN
30.0000 mg | Freq: Once | INTRAMUSCULAR | Status: AC
  Administered 2022-02-28: 30 mg via INTRAVENOUS
  Filled 2022-02-28: qty 1
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
## 2022-02-28 NOTE — Sedation Documentation (Signed)
Barbeau classification of type B prior to radial artery puncture.

## 2022-02-28 NOTE — Procedures (Signed)
  Procedure:  Transradial bilat uterine artery embolization   Preprocedure diagnosis: Diagnoses of Uterine leiomyoma, unspecified location, Pelvic pain, and Fibroids were pertinent to this visit.  Postprocedure diagnosis: same EBL:    minimal Complications:   none immediate  See full dictation in [employer].  Dillard Cannon MD Main # [phone] Pager  [phone] Mobile [phone]

## 2022-03-01 DIAGNOSIS — Z8616 Personal history of COVID-19: Secondary | ICD-10-CM | POA: Diagnosis not present

## 2022-03-01 DIAGNOSIS — D259 Leiomyoma of uterus, unspecified: Secondary | ICD-10-CM | POA: Diagnosis not present

## 2022-03-01 DIAGNOSIS — Z87891 Personal history of nicotine dependence: Secondary | ICD-10-CM | POA: Diagnosis not present

## 2022-03-01 MED ORDER — HYDROCODONE-ACETAMINOPHEN 5-325 MG PO TABS
1.0000 | ORAL_TABLET | Freq: Once | ORAL | Status: AC
  Administered 2022-03-01: 1 via ORAL
  Filled 2022-03-01: qty 1

## 2022-03-01 MED ORDER — ONDANSETRON HCL 4 MG PO TABS: 4.0000 mg | ORAL_TABLET | Freq: Three times a day (TID) | ORAL | 0 refills | Status: AC | PRN

## 2022-03-01 NOTE — Plan of Care (Signed)
Patient resting in bed after handoff at 0320.  PCA on-going.  POC maintained, will continue to monitor.  Problem: Education: Goal: Knowledge of General Education information will improve Description: Including pain rating scale, medication(s)/side effects and non-pharmacologic comfort measures Outcome: Progressing   Problem: Health Behavior/Discharge Planning: Goal: Ability to manage health-related needs will improve Outcome: Progressing   Problem: Clinical Measurements: Goal: Ability to maintain clinical measurements within normal limits will improve Outcome: Progressing Goal: Will remain free from infection Outcome: Progressing Goal: Diagnostic test results will improve Outcome: Progressing Goal: Respiratory complications will improve Outcome: Progressing Goal: Cardiovascular complication will be avoided Outcome: Progressing   Problem: Activity: Goal: Risk for activity intolerance will decrease Outcome: Progressing   Problem: Nutrition: Goal: Adequate nutrition will be maintained Outcome: Progressing   Problem: Coping: Goal: Level of anxiety will decrease Outcome: Progressing   Problem: Elimination: Goal: Will not experience complications related to bowel motility Outcome: Progressing Goal: Will not experience complications related to urinary retention Outcome: Progressing   Problem: Pain Managment: Goal: General experience of comfort will improve Outcome: Progressing   Problem: Safety: Goal: Ability to remain free from injury will improve Outcome: Progressing   Problem: Skin Integrity: Goal: Risk for impaired skin integrity will decrease Outcome: Progressing

## 2022-03-01 NOTE — Discharge Summary (Signed)
Patient ID: Tasha Barrett MRN: [id number] DOB/AGE: 1990/04/27 32 y.o.  Admit date: 02/28/2022 Discharge date: 03/01/2022  Supervising Physician: Daryll Brod  Patient Status: [facility] - In-pt  Admission Diagnoses: Uterine Fibroids  Discharge Diagnoses:  Uterine Fibroids  Discharged Condition: good  Hospital Course: Patient presented to [facility] Stay for planned intervention of uterine fibroid embolization.  Procedure was uneventful.  She had a single episode of emesis last evening, but reports no additional N/V.  She reports pain is well controlled, has eaten, and has voided since foley removal.  She is stable for discharge.  Consults: None  Significant Diagnostic Studies: Refer to procedural imaging  Treatments: bilateral uterine fibroid artery embolization  Discharge Exam: Blood pressure 132/66, pulse 95, temperature 98.8 F (37.1 C), temperature source Oral, resp. rate 20, last menstrual period 02/10/2022, SpO2 (!) 88 %.  Physical Exam Constitutional:      Appearance: She is not ill-appearing.  HENT:     Mouth/Throat:     Mouth: Mucous membranes are moist.     Pharynx: Oropharynx is clear.  Cardiovascular:     Rate and Rhythm: Normal rate and regular rhythm.     Pulses: Normal pulses.     Comments: left radial access site is dry, soft, without hematoma or evidence for pseudoaneurysm Pulmonary:     Effort: Pulmonary effort is normal. No respiratory distress.  Skin:    General: Skin is warm and dry.  Neurological:     General: No focal deficit present.     Mental Status: She is alert and oriented to person, place, and time.  Psychiatric:        Mood and Affect: Mood normal.        Thought Content: Thought content normal.        Judgment: Judgment normal.   Disposition: Discharge disposition: 01-Home or Self Care      Discharge Instructions     Call MD for:  difficulty breathing, headache or visual disturbances   Complete by: As directed    Call MD for:   persistant nausea and vomiting   Complete by: As directed    Call MD for:  redness, tenderness, or signs of infection (pain, swelling, redness, odor or green/yellow discharge around incision site)   Complete by: As directed    Call MD for:  severe uncontrolled pain   Complete by: As directed    Call MD for:  temperature >100.4   Complete by: As directed    Diet - low sodium heart healthy   Complete by: As directed    Discharge instructions   Complete by: As directed    No bending, stooping, pushing, pulling, or lifting more than 10 pounds for 1 week.  Increase activity slowly.  You may remove bandage tomorrow and shower.  Take medication for moderate to severe pain as prescribed.  You will receive a call to schedule your follow up appointment with Dr. Vernard Gambles.  Call [phone] or [phone] with questions or concerns.   Increase activity slowly   Complete by: As directed       Allergies as of 03/01/2022       Reactions   Amoxicillin-pot Clavulanate Other (See Comments)   As child - pt reports NO Hx anaphylaxis, thinks was hives   Latex Other (See Comments)   Possible yeast infection, after latex condom use        Medication List     TAKE these medications    acetaminophen 500 MG tablet Commonly known  as: TYLENOL Take 1 tablet (500 mg total) by mouth every 6 (six) hours as needed.   ferrous sulfate 325 (65 FE) MG tablet Take 325 mg by mouth daily with breakfast.   ibuprofen 800 MG tablet Commonly known as: ADVIL Take 1 tablet (800 mg total) by mouth 3 (three) times daily with meals as needed for headache, moderate pain or cramping.   metroNIDAZOLE 500 MG tablet Commonly known as: FLAGYL Take 1 tablet (500 mg total) by mouth 2 (two) times daily.   ondansetron 4 MG tablet Commonly known as: Zofran Take 1 tablet (4 mg total) by mouth every 8 (eight) hours as needed for up to 5 days for nausea or vomiting.   propranolol 20 MG/5ML solution Commonly known as:  INDERAL Take 10 mg by mouth daily as needed (Anxiety).   venlafaxine XR 75 MG 24 hr capsule Commonly known as: EFFEXOR-XR Take 75 mg by mouth at bedtime.          Electronically Signed: Pasty Spillers, PA 03/01/2022, 11:34 AM   I have spent  30 minutes  discharging Tasha Barrett.

## 2022-03-01 NOTE — TOC Progression Note (Signed)
Transition of Care [facility]) - Progression Note    Patient Details  Name: Tasha Barrett MRN: [id number] Date of Birth: [birth year]  Transition of Care [facility]) CM/SW [place], RN Phone Number:[phone]  03/01/2022, 8:20 AM  Clinical Narrative:     Transition of Care [facility]) Screening Note   Patient Details  Name: Tasha Barrett Date of Birth: 1990-09-20   Transition of Care [facility]) CM/SW Contact:    Angelita Ingles, RN Phone Number: 03/01/2022, 8:20 AM    Transition of Care Department [facility]) has reviewed patient and no TOC needs have been identified at this time. We will continue to monitor patient advancement through interdisciplinary progression rounds. If new patient transition needs arise, please place a TOC consult.          Expected Discharge Plan and Services                                               Social Determinants of Health (SDOH) Interventions SDOH Screenings   Depression (PHQ2-9): Medium Risk (11/08/2021)  Tobacco Use: Low Risk  (03/01/2022)    Readmission Risk Interventions     No data to display

## 2022-03-01 NOTE — Progress Notes (Signed)
Discharge instructions provided to patient. All medications, follow up instructions, and discharge instructions discussed. IV out. Discharging to home.

## 2022-03-01 NOTE — Progress Notes (Signed)
23 mg of hydromorphone PCA wasted with Luanne Bras RN.  Era Bumpers, RN

## 2022-03-02 ENCOUNTER — Telehealth: Payer: Self-pay

## 2022-03-02 NOTE — Telephone Encounter (Signed)
Transition Care Management Unsuccessful Follow-up Telephone Call  Date of discharge and from where:  Tasha Barrett 03/01/2022  Attempts:  1st Attempt  Reason for unsuccessful TCM follow-up call:  Left voice message Juanda Crumble, [place] Direct Dial [phone]

## 2022-03-05 NOTE — Telephone Encounter (Signed)
Transition Care Management Unsuccessful Follow-up Telephone Call  Date of discharge and from where:  Tasha Barrett 03/01/2022  Attempts:  2nd Attempt  Reason for unsuccessful TCM follow-up call:  Left voice message Juanda Crumble, [place] Direct Dial [phone]

## 2022-03-06 DIAGNOSIS — F411 Generalized anxiety disorder: Secondary | ICD-10-CM | POA: Diagnosis not present

## 2022-03-08 DIAGNOSIS — Z419 Encounter for procedure for purposes other than remedying health state, unspecified: Secondary | ICD-10-CM | POA: Diagnosis not present

## 2022-03-13 ENCOUNTER — Telehealth: Payer: Self-pay

## 2022-03-13 NOTE — Telephone Encounter (Signed)
Patient advised. She will call back if needed.

## 2022-03-13 NOTE — Telephone Encounter (Signed)
Wendell came by the office for immunization record. She was hired by [employer] and needs the record. However she is from [place] and we do not have her vaccine record in her chart. She may need titers for the missing vaccines.

## 2022-03-20 ENCOUNTER — Encounter: Payer: Commercial Managed Care - HMO | Admitting: Medical-Surgical

## 2022-03-29 ENCOUNTER — Encounter: Payer: Self-pay | Admitting: Medical-Surgical

## 2022-03-29 ENCOUNTER — Telehealth (INDEPENDENT_AMBULATORY_CARE_PROVIDER_SITE_OTHER): Payer: Medicaid Other | Admitting: Medical-Surgical

## 2022-03-29 DIAGNOSIS — J069 Acute upper respiratory infection, unspecified: Secondary | ICD-10-CM

## 2022-03-29 NOTE — Progress Notes (Signed)
Virtual Visit via Video Note  I connected with Tasha Barrett on 03/29/22 at 11:10 AM EST by a video enabled telemedicine application and verified that I am speaking with the correct person using two identifiers.   I discussed the limitations of evaluation and management by telemedicine and the availability of in person appointments. The patient expressed understanding and agreed to proceed.  Patient location: home Provider locations: office  Subjective:    CC: Respiratory symptoms  HPI: Pleasant 32 year old female presenting today with reports of 4 days of respiratory symptoms.  On Monday, she developed a severe headache and beginnings of a sore throat.  She did not have any fever that day but noted that her symptoms worsened in the afternoon.  On Tuesday, she awoke with a fever and was not feeling great but was also not feeling terribly bad.  On Wednesday, she woke up miserable with a sore throat, headache, coughing, sneezing, postnasal drip, fatigue, body aches, and low-grade fever.  She has had a reduction in appetite but no other GI symptoms.  Today, she reports that she is starting to feel little bit better.  Yesterday she took TheraFlu for cold symptoms several times throughout the day but reports that it did not help much to keep her fever down.  She has not tested for COVID and has not sought medical evaluation or treatment elsewhere.  Past medical history, Surgical history, Family history not pertinant except as noted below, Social history, Allergies, and medications have been entered into the medical record, reviewed, and corrections made.   Review of Systems: See HPI for pertinent positives and negatives.   Objective:    General: Speaking clearly in complete sentences without any shortness of breath.  Alert and oriented x3.  Normal judgment. No apparent acute distress.  Impression and Recommendations:    1. Viral URI Discussed symptoms with patient.  This is most likely a  viral illness and will need to run its course.  Offered drive up testing for COVID and flu however she declined today since she is starting to feel better overall.  Work note provided via Pharmacist, community to excuse for time missed from work.  Advised to continue conservative measures at home.  I discussed the assessment and treatment plan with the patient. The patient was provided an opportunity to ask questions and all were answered. The patient agreed with the plan and demonstrated an understanding of the instructions.   The patient was advised to call back or seek an in-person evaluation if the symptoms worsen or if the condition fails to improve as anticipated.  25 minutes of non-face-to-face time was provided during this encounter.  Return if symptoms worsen or fail to improve.  Clearnce Sorrel, DNP, APRN, FNP-BC [facility]

## 2022-03-30 MED ORDER — NIRMATRELVIR/RITONAVIR (PAXLOVID)TABLET: 3.0000 | ORAL_TABLET | Freq: Two times a day (BID) | ORAL | 0 refills | Status: AC

## 2022-04-04 ENCOUNTER — Ambulatory Visit
Admission: RE | Admit: 2022-04-04 | Discharge: 2022-04-04 | Disposition: A | Discharge status: Home / Self Care | Payer: Medicaid Other | Source: Ambulatory Visit | Attending: Physician Assistant | Admitting: Physician Assistant

## 2022-04-04 DIAGNOSIS — D259 Leiomyoma of uterus, unspecified: Secondary | ICD-10-CM | POA: Diagnosis not present
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
## 2022-04-05 ENCOUNTER — Other Ambulatory Visit (HOSPITAL_COMMUNITY)
Admission: RE | Admit: 2022-04-05 | Discharge: 2022-04-05 | Disposition: A | Discharge status: Home / Self Care | Payer: Medicaid Other | Source: Ambulatory Visit | Attending: Obstetrics and Gynecology | Admitting: Obstetrics and Gynecology

## 2022-04-05 ENCOUNTER — Other Ambulatory Visit (HOSPITAL_COMMUNITY): Payer: Self-pay | Admitting: Interventional Radiology

## 2022-04-05 ENCOUNTER — Ambulatory Visit (INDEPENDENT_AMBULATORY_CARE_PROVIDER_SITE_OTHER): Payer: Medicaid Other | Admitting: Obstetrics and Gynecology

## 2022-04-05 VITALS — BP 125/74 | HR 74 | Ht 62.0 in | Wt 239.0 lb

## 2022-04-05 DIAGNOSIS — D259 Leiomyoma of uterus, unspecified: Secondary | ICD-10-CM

## 2022-04-05 DIAGNOSIS — N760 Acute vaginitis: Secondary | ICD-10-CM

## 2022-04-05 DIAGNOSIS — N898 Other specified noninflammatory disorders of vagina: Secondary | ICD-10-CM

## 2022-04-05 DIAGNOSIS — Z09 Encounter for follow-up examination after completed treatment for conditions other than malignant neoplasm: Secondary | ICD-10-CM

## 2022-04-05 DIAGNOSIS — R635 Abnormal weight gain: Secondary | ICD-10-CM | POA: Diagnosis not present

## 2022-04-05 DIAGNOSIS — B9689 Other specified bacterial agents as the cause of diseases classified elsewhere: Secondary | ICD-10-CM

## 2022-04-05 DIAGNOSIS — Z113 Encounter for screening for infections with a predominantly sexual mode of transmission: Secondary | ICD-10-CM | POA: Diagnosis not present

## 2022-04-05 DIAGNOSIS — R102 Pelvic and perineal pain: Secondary | ICD-10-CM

## 2022-04-05 NOTE — Procedures (Signed)
  Entered in error. See progress note.   Review of Systems  Physical Exam
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
## 2022-04-06 DIAGNOSIS — Z419 Encounter for procedure for purposes other than remedying health state, unspecified: Secondary | ICD-10-CM | POA: Diagnosis not present

## 2022-04-09 LAB — CERVICOVAGINAL ANCILLARY ONLY
Bacterial Vaginitis (gardnerella): POSITIVE — AB
Candida Glabrata: NEGATIVE
Candida Vaginitis: NEGATIVE
Chlamydia: NEGATIVE
Comment: NEGATIVE
Comment: NEGATIVE
Comment: NEGATIVE
Comment: NEGATIVE
Comment: NEGATIVE
Comment: NORMAL
Neisseria Gonorrhea: NEGATIVE
Trichomonas: NEGATIVE

## 2022-04-10 MED ORDER — METRONIDAZOLE 500 MG PO TABS: 500.0000 mg | ORAL_TABLET | Freq: Two times a day (BID) | ORAL | 0 refills | Status: DC

## 2022-04-10 NOTE — Addendum Note (Signed)
Addended by: Radene Gunning A on: 04/10/2022 09:42 AM   Modules accepted: Orders

## 2022-04-12 ENCOUNTER — Encounter (HOSPITAL_COMMUNITY): Payer: Self-pay

## 2022-04-12 ENCOUNTER — Emergency Department (HOSPITAL_COMMUNITY): Payer: Medicaid Other

## 2022-04-12 ENCOUNTER — Other Ambulatory Visit: Payer: Self-pay

## 2022-04-12 ENCOUNTER — Emergency Department (HOSPITAL_COMMUNITY)
Admission: EM | Admit: 2022-04-12 | Discharge: 2022-04-12 | Disposition: A | Discharge status: Home / Self Care | Payer: Medicaid Other | Attending: Emergency Medicine | Admitting: Emergency Medicine

## 2022-04-12 DIAGNOSIS — R42 Dizziness and giddiness: Secondary | ICD-10-CM | POA: Diagnosis not present

## 2022-04-12 DIAGNOSIS — Z20822 Contact with and (suspected) exposure to covid-19: Secondary | ICD-10-CM | POA: Diagnosis not present

## 2022-04-12 DIAGNOSIS — E860 Dehydration: Secondary | ICD-10-CM | POA: Diagnosis not present

## 2022-04-12 DIAGNOSIS — R1084 Generalized abdominal pain: Secondary | ICD-10-CM | POA: Diagnosis not present

## 2022-04-12 DIAGNOSIS — Z91040 Latex allergy status: Secondary | ICD-10-CM | POA: Diagnosis not present

## 2022-04-12 DIAGNOSIS — R109 Unspecified abdominal pain: Secondary | ICD-10-CM | POA: Diagnosis not present

## 2022-04-12 DIAGNOSIS — Z743 Need for continuous supervision: Secondary | ICD-10-CM | POA: Diagnosis not present

## 2022-04-12 DIAGNOSIS — D259 Leiomyoma of uterus, unspecified: Secondary | ICD-10-CM | POA: Diagnosis not present

## 2022-04-12 DIAGNOSIS — R112 Nausea with vomiting, unspecified: Secondary | ICD-10-CM | POA: Insufficient documentation

## 2022-04-12 LAB — RESP PANEL BY RT-PCR (RSV, FLU A&B, COVID)  RVPGX2
Influenza A by PCR: NEGATIVE
Influenza B by PCR: NEGATIVE
Resp Syncytial Virus by PCR: NEGATIVE
SARS Coronavirus 2 by RT PCR: NEGATIVE

## 2022-04-12 LAB — CBC WITH DIFFERENTIAL/PLATELET
Abs Immature Granulocytes: 0.03 10*3/uL (ref 0.00–0.07)
Basophils Absolute: 0 10*3/uL (ref 0.0–0.1)
Basophils Relative: 0 %
Eosinophils Absolute: 0.1 10*3/uL (ref 0.0–0.5)
Eosinophils Relative: 1 %
HCT: 33.8 % — ABNORMAL LOW (ref 36.0–46.0)
Hemoglobin: 10.1 g/dL — ABNORMAL LOW (ref 12.0–15.0)
Immature Granulocytes: 0 %
Lymphocytes Relative: 6 %
Lymphs Abs: 0.6 10*3/uL — ABNORMAL LOW (ref 0.7–4.0)
MCH: 24.3 pg — ABNORMAL LOW (ref 26.0–34.0)
MCHC: 29.9 g/dL — ABNORMAL LOW (ref 30.0–36.0)
MCV: 81.4 fL (ref 80.0–100.0)
Monocytes Absolute: 0.6 10*3/uL (ref 0.1–1.0)
Monocytes Relative: 6 %
Neutro Abs: 8 10*3/uL — ABNORMAL HIGH (ref 1.7–7.7)
Neutrophils Relative %: 87 %
Platelets: 263 10*3/uL (ref 150–400)
RBC: 4.15 MIL/uL (ref 3.87–5.11)
RDW: 14.7 % (ref 11.5–15.5)
WBC: 9.3 10*3/uL (ref 4.0–10.5)
nRBC: 0 % (ref 0.0–0.2)

## 2022-04-12 LAB — URINALYSIS, ROUTINE W REFLEX MICROSCOPIC
Bilirubin Urine: NEGATIVE
Glucose, UA: NEGATIVE mg/dL
Hgb urine dipstick: NEGATIVE
Ketones, ur: NEGATIVE mg/dL
Leukocytes,Ua: NEGATIVE
Nitrite: NEGATIVE
Protein, ur: NEGATIVE mg/dL
Specific Gravity, Urine: 1.017 (ref 1.005–1.030)
pH: 7 (ref 5.0–8.0)

## 2022-04-12 LAB — COMPREHENSIVE METABOLIC PANEL
ALT: 11 U/L (ref 0–44)
AST: 15 U/L (ref 15–41)
Albumin: 3.5 g/dL (ref 3.5–5.0)
Alkaline Phosphatase: 62 U/L (ref 38–126)
Anion gap: 6 (ref 5–15)
BUN: 17 mg/dL (ref 6–20)
CO2: 24 mmol/L (ref 22–32)
Calcium: 8.1 mg/dL — ABNORMAL LOW (ref 8.9–10.3)
Chloride: 105 mmol/L (ref 98–111)
Creatinine, Ser: 0.81 mg/dL (ref 0.44–1.00)
GFR, Estimated: >60 mL/min (ref >60)
Glucose, Bld: 113 mg/dL — ABNORMAL HIGH (ref 70–99)
Potassium: 3.9 mmol/L (ref 3.5–5.1)
Sodium: 135 mmol/L (ref 135–145)
Total Bilirubin: 0.6 mg/dL (ref 0.3–1.2)
Total Protein: 7.6 g/dL (ref 6.5–8.1)

## 2022-04-12 LAB — POC URINE PREG, ED: Preg Test, Ur: NEGATIVE

## 2022-04-12 LAB — LIPASE, BLOOD: Lipase: 32 U/L (ref 11–51)

## 2022-04-12 MED ORDER — METOCLOPRAMIDE HCL 5 MG/ML IJ SOLN
10.0000 mg | Freq: Once | INTRAMUSCULAR | Status: AC
  Administered 2022-04-12: 10 mg via INTRAVENOUS
  Filled 2022-04-12: qty 2

## 2022-04-12 MED ORDER — SODIUM CHLORIDE 0.9 % IV BOLUS
1000.0000 mL | Freq: Once | INTRAVENOUS | Status: AC
  Administered 2022-04-12: 1000 mL via INTRAVENOUS

## 2022-04-12 MED ORDER — MORPHINE SULFATE (PF) 4 MG/ML IV SOLN
4.0000 mg | Freq: Once | INTRAVENOUS | Status: AC
  Administered 2022-04-12: 4 mg via INTRAVENOUS
  Filled 2022-04-12: qty 1

## 2022-04-12 MED ORDER — DIPHENHYDRAMINE HCL 50 MG/ML IJ SOLN
25.0000 mg | Freq: Once | INTRAMUSCULAR | Status: AC
  Administered 2022-04-12: 25 mg via INTRAVENOUS
  Filled 2022-04-12: qty 1

## 2022-04-12 MED ORDER — IOHEXOL 300 MG/ML  SOLN
100.0000 mL | Freq: Once | INTRAMUSCULAR | Status: AC | PRN
  Administered 2022-04-12: 100 mL via INTRAVENOUS

## 2022-04-12 MED ORDER — ACETAMINOPHEN 500 MG PO TABS
1000.0000 mg | ORAL_TABLET | Freq: Once | ORAL | Status: AC
  Administered 2022-04-12: 1000 mg via ORAL
  Filled 2022-04-12: qty 2

## 2022-04-12 NOTE — ED Triage Notes (Addendum)
Patient brought in by [facility], reports onset on n/v for the past 4 hours. Also endorses upper abd pain. States she started taking prescribed adderall two mornings ago and had covid two weeks ago.

## 2022-04-12 NOTE — ED Notes (Signed)
Pt tolerating PO fluids/food at this time.

## 2022-04-12 NOTE — ED Provider Notes (Signed)
[facility] AT [facility] Provider Note   CSN: [id number] Arrival date & time: 04/12/22  [id number]     History  Chief Complaint  Patient presents with   Emesis   Abdominal Pain    Appollonia Smoley is a 32 y.o. female.  32 y.o female with no PMH presents to the ED with a chief complaint of nausea, vomiting and abdominal pain which began 4 hours PTA. She endorses a cramping, generalized pain to the upper part of her abdomen. She reports multiple episodes of non bilious, non bloody emesis in which she ended filling up her bathtub with vomit. She did not take any medication for improvement in symptoms. No alleviating factors. She was given Zofran along with 500 ml bolus by EMS with no improvement in symptoms. She endorses a fever with a Tmax of 99.1. 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 She did not receive influenza vaccine this season. No urinary symptoms, no diarrhea, no other complaints.   The history is provided by the patient.  Emesis Severity:  Severe Duration:  4 hours Timing:  Constant Quality:  Stomach contents Progression:  Worsening Chronicity:  New Associated symptoms: abdominal pain   Associated symptoms: no chills, no diarrhea, no fever and no sore throat   Risk factors: sick contacts   Risk factors: no diabetes, not pregnant and no prior abdominal surgery   Abdominal Pain Associated symptoms: nausea and vomiting   Associated symptoms: no chest pain, no chills, no diarrhea, no fever, no shortness of breath and no sore throat        Home Medications Prior to Admission medications   Medication Sig Start Date End Date Taking? Authorizing Provider  metroNIDAZOLE (FLAGYL) 500 MG tablet Take 1 tablet (500 mg total) by mouth 2 (two) times daily. 04/10/22   Radene Gunning, MD  venlafaxine XR (EFFEXOR-XR) 75 MG 24 hr capsule Take 75 mg by mouth at bedtime.    [provider]      Allergies    Amoxicillin-pot  clavulanate and Latex    Review of Systems   Review of Systems  Constitutional:  Negative for chills and fever.  HENT:  Negative for sore throat.   Respiratory:  Negative for shortness of breath.   Cardiovascular:  Negative for chest pain.  Gastrointestinal:  Positive for abdominal pain, nausea and vomiting. Negative for diarrhea.  Genitourinary:  Negative for difficulty urinating and flank pain.  Musculoskeletal:  Negative for back pain.  Skin:  Positive for wound. Negative for pallor.  All other systems reviewed and are negative.   Physical Exam Updated Vital Signs BP 128/78   Pulse (!) 101   Temp 99.2 F (37.3 C) (Oral)   Resp 16   Ht '5\' 2"'$  (1.575 m)   Wt 108.4 kg   SpO2 95%   BMI 43.71 kg/m  Physical Exam Vitals and nursing note reviewed.  Constitutional:      Appearance: She is well-developed. She is ill-appearing.  HENT:     Head: Normocephalic and atraumatic.  Cardiovascular:     Rate and Rhythm: Normal rate.  Pulmonary:     Effort: Pulmonary effort is normal.     Breath sounds: No wheezing.  Abdominal:     General: Abdomen is flat. Bowel sounds are decreased.     Palpations: Abdomen is soft.     Tenderness: There is generalized abdominal tenderness. There is no right CVA tenderness, left CVA tenderness, guarding or  rebound.     Comments: Abdomen is soft, generally tender to palpation with decreased bowel sounds.   Skin:    General: Skin is warm and dry.  Neurological:     Mental Status: She is alert.     ED Results / Procedures / Treatments   Labs (all labs ordered are listed, but only abnormal results are displayed) Labs Reviewed  URINALYSIS, ROUTINE W REFLEX MICROSCOPIC - Abnormal; Notable for the following components:      Result Value   APPearance HAZY (*)    All other components within normal limits  CBC WITH DIFFERENTIAL/PLATELET - Abnormal; Notable for the following components:   Hemoglobin 10.1 (*)    HCT 33.8 (*)    MCH 24.3 (*)    MCHC  29.9 (*)    Neutro Abs 8.0 (*)    Lymphs Abs 0.6 (*)    All other components within normal limits  COMPREHENSIVE METABOLIC PANEL - Abnormal; Notable for the following components:   Glucose, Bld 113 (*)    Calcium 8.1 (*)    All other components within normal limits  RESP PANEL BY RT-PCR (RSV, FLU A&B, COVID)  RVPGX2  LIPASE, BLOOD  POC URINE PREG, ED    EKG None  Radiology CT ABDOMEN PELVIS W CONTRAST  Result Date: 04/12/2022 CLINICAL DATA:  Abdominal pain EXAM: CT ABDOMEN AND PELVIS WITH CONTRAST TECHNIQUE: Multidetector CT imaging of the abdomen and pelvis was performed using the standard protocol following bolus administration of intravenous contrast. RADIATION DOSE REDUCTION: This exam was performed according to the departmental dose-optimization program which includes automated exposure control, adjustment of the mA and/or kV according to patient size and/or use of iterative reconstruction technique. CONTRAST:  184m OMNIPAQUE IOHEXOL 300 MG/ML  SOLN COMPARISON:  None Available. FINDINGS: Lower chest: No acute abnormality. Hepatobiliary: No focal liver abnormality is seen. No gallstones, gallbladder wall thickening, or biliary dilatation. Pancreas: Unremarkable. No pancreatic ductal dilatation or surrounding inflammatory changes. Spleen: Normal in size without focal abnormality. Adrenals/Urinary Tract: Adrenal glands are unremarkable. Kidneys are normal, without renal calculi, focal lesion, or hydronephrosis. Bladder is unremarkable. Stomach/Bowel: Stomach is within normal limits. Appendix appears normal. No evidence of bowel wall thickening, distention, or inflammatory changes. Vascular/Lymphatic: No significant vascular findings are present. No enlarged abdominal or pelvic lymph nodes. Reproductive: Globular uterus with multiple fibroids. A dystrophic calcification is also present in the uterine fundus. No adnexal masses. Other: No abdominal wall hernia or abnormality. No abdominopelvic  ascites. Musculoskeletal: No acute fracture or aggressive appearing lytic or blastic osseous lesion. IMPRESSION: 1. No acute abnormality within the abdomen or pelvis. 2. Multi fibroid uterus. Electronically Signed   By: HJacqulynn CadetM.D.   On: 04/12/2022 10:31    Procedures Procedures    Medications Ordered in ED Medications  sodium chloride 0.9 % bolus 1,000 mL (0 mLs Intravenous Stopped 04/12/22 0951)  morphine (PF) 4 MG/ML injection 4 mg (4 mg Intravenous Given 04/12/22 0731)  metoCLOPramide (REGLAN) injection 10 mg (10 mg Intravenous Given 04/12/22 0728)  iohexol (OMNIPAQUE) 300 MG/ML solution 100 mL (100 mLs Intravenous Contrast Given 04/12/22 1020)  acetaminophen (TYLENOL) tablet 1,000 mg (1,000 mg Oral Given 04/12/22 1159)  sodium chloride 0.9 % bolus 1,000 mL (0 mLs Intravenous Stopped 04/12/22 1333)  diphenhydrAMINE (BENADRYL) injection 25 mg (25 mg Intravenous Given 04/12/22 1155)  metoCLOPramide (REGLAN) injection 10 mg (10 mg Intravenous Given 04/12/22 1155)    ED Course/ Medical Decision Making/ A&P  Medical Decision Making Amount and/or Complexity of Data Reviewed Labs: ordered. Radiology: ordered.  Risk OTC drugs. Prescription drug management.    This patient presents to the ED for concern of abdominal pain, nausea and vomiting, this involves a number of treatment options, and is a complaint that carries with it a high risk of complications and morbidity.  The differential diagnosis includes gastroenteritis, viral illness, versus obstruction.    Co morbidities: Discussed in HPI   Brief History:  Patient with no pertinent past medical history here with nausea, vomiting, abdominal pain which began 4 hours prior to arrival.  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  Generalized abdominal pain with no focal point of tenderness.  She was recently diagnosed with BV, but has not picked up the Flagyl from  the pharmacy.  She denies any sick symptoms, no influenza vaccination this season.  EMR reviewed including pt PMHx, past surgical history and past visits to ER.   See HPI for more details   Lab Tests:  I ordered and independently interpreted labs.  The pertinent results include:    I personally reviewed all laboratory work and imaging. Metabolic panel without any acute abnormality specifically kidney function within normal limits and no significant electrolyte abnormalities. CBC without leukocytosis or significant anemia.  Urinalysis without any nitrites, leukocytes or signs of infection.  Lipase level is normal.  Respiratory panel is negative for influenza, COVID-19.   Imaging Studies:  NAD. I personally reviewed all imaging studies and no acute abnormality found. I agree with radiology interpretation.  Medicines ordered:  I ordered medication including benadryl, reglan, tylenol, bolus  for symptomatic treatment Reevaluation of the patient after these medicines showed that the patient improved I have reviewed the patients home medicines and have made adjustments as needed  Reevaluation:  After the interventions noted above I re-evaluated patient and found that they have :improved   Social Determinants of Health:  The patient's social determinants of health were a factor in the care of this patient   Problem List / ED Course:  Patient presents to the ED with a chief complaint of nausea, vomiting several times approximately 4 hours prior to arrival in the emergency department.  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  On evaluation she appears uncomfortable, is febrile with a temperature of 99.9.  Interpretation of her blood work by me reveal a CBC with no leukocytosis, hemoglobin is within normal limits.  CMP with no electrolyte derangement.  Creatinine levels unremarkable.  BUN is stable, despite multiple days of vomiting dehydration.   LFTs are within normal limits.  Respiratory panel is negative for influenza A, B, COVID-19.  Lipase level is negative.  Her pregnancy test is also negative.  UA without any neck leukocytes or signs consistent with infection. Patient received Tylenol, Benadryl, Reglan, bolus x 2 in order to help with symptoms.  No further episodes of vomiting while in the emergency department.  I do feel likely viral illness as patient is around children.  CT of her abdomen was obtained to rule out any acute finding.  No appendicitis, no other acute findings noted. Patient received 2 L bolus of normal saline.  Tachycardia has improved with a heart of 101, no hypoxia.  Temperature is 99.2, given Motrin.  Suspect likely viral illness with the ongoing nausea and vomiting however has not vomited here in the remaining of her time.  Pregnancy test also negative.  We discussed close follow-up with PCP.  Patient is hemodynamically stable for discharge.  Dispostion:  After consideration of the diagnostic results and the patients response to treatment, I feel that the patent would benefit from continue monitor and close PCP follow up.     Portions of this note were generated with Lobbyist. Dictation errors may occur despite best attempts at proofreading.  Final Clinical Impression(s) / ED Diagnoses Final diagnoses:  Nausea and vomiting, unspecified vomiting type    Rx / DC Orders ED Discharge Orders     None         Janeece Fitting, PA-C 04/12/22 1351    Ezequiel Essex, MD 04/12/22 Einar Crow

## 2022-04-12 NOTE — Discharge Instructions (Addendum)
Your laboratories also within normal limits today.  You will need to follow-up with your primary care physician as needed  You will need to continue taking Tylenol and IV Profen.  To help with your fever.

## 2022-04-16 ENCOUNTER — Telehealth: Payer: Self-pay | Admitting: General Practice

## 2022-04-16 NOTE — Transitions of Care (Post Inpatient/ED Visit) (Signed)
   04/16/2022  Name: Tasha Barrett MRN: [id number] DOB: [birth year]  Today's TOC FU Call Status: Today's TOC FU Call Status:: Unsuccessul Call (1st Attempt) Unsuccessful Call (1st Attempt) Date: 04/16/22  Attempted to reach the patient regarding the most recent Inpatient/ED visit.  Follow Up Plan: Additional outreach attempts will be made to reach the patient to complete the Transitions of Care (Post Inpatient/ED visit) call.   Signature Tinnie Gens, RN BSN

## 2022-04-18 NOTE — Transitions of Care (Post Inpatient/ED Visit) (Signed)
   04/18/2022  Name: Tasha Barrett MRN: [id number] DOB: 04-25-1990  Today's TOC FU Call Status: Today's TOC FU Call Status:: Unsuccessful Call (2nd Attempt) Unsuccessful Call (1st Attempt) Date: 04/16/22 Unsuccessful Call (2nd Attempt) Date: 04/18/22  Attempted to reach the patient regarding the most recent Inpatient/ED visit.  Follow Up Plan: Additional outreach attempts will be made to reach the patient to complete the Transitions of Care (Post Inpatient/ED visit) call.   Signature Tinnie Gens, RN BSN

## 2022-04-19 DIAGNOSIS — F411 Generalized anxiety disorder: Secondary | ICD-10-CM | POA: Diagnosis not present

## 2022-04-20 NOTE — Transitions of Care (Post Inpatient/ED Visit) (Signed)
   04/20/2022  Name: Tasha Barrett MRN: [id number] DOB: [birth year]  Today's TOC FU Call Status: Today's TOC FU Call Status:: Unsuccessful Call (3rd Attempt) Unsuccessful Call (1st Attempt) Date: 04/16/22 Unsuccessful Call (2nd Attempt) Date: 04/18/22 Unsuccessful Call (3rd Attempt) Date: 04/20/22  Attempted to reach the patient regarding the most recent Inpatient/ED visit.  Follow Up Plan: No further outreach attempts will be made at this time. We have been unable to contact the patient.  Signature Tinnie Gens, RN BSN

## 2022-04-30 ENCOUNTER — Encounter: Payer: Medicaid Other | Admitting: Nurse Practitioner

## 2022-05-01 ENCOUNTER — Ambulatory Visit (INDEPENDENT_AMBULATORY_CARE_PROVIDER_SITE_OTHER): Payer: Medicaid Other | Admitting: Nurse Practitioner

## 2022-05-01 ENCOUNTER — Encounter: Payer: Self-pay | Admitting: Nurse Practitioner

## 2022-05-01 VITALS — BP 129/80 | HR 83 | Temp 98.4°F | Ht 62.0 in | Wt 231.0 lb

## 2022-05-01 DIAGNOSIS — R7303 Prediabetes: Secondary | ICD-10-CM | POA: Diagnosis not present

## 2022-05-01 DIAGNOSIS — Z6841 Body Mass Index (BMI) 40.0 and over, adult: Secondary | ICD-10-CM | POA: Diagnosis not present

## 2022-05-01 DIAGNOSIS — Z0289 Encounter for other administrative examinations: Secondary | ICD-10-CM
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
## 2022-05-07 DIAGNOSIS — Z419 Encounter for procedure for purposes other than remedying health state, unspecified: Secondary | ICD-10-CM | POA: Diagnosis not present

## 2022-05-08 ENCOUNTER — Ambulatory Visit (INDEPENDENT_AMBULATORY_CARE_PROVIDER_SITE_OTHER): Payer: Medicaid Other | Admitting: Medical-Surgical

## 2022-05-08 ENCOUNTER — Encounter: Payer: Self-pay | Admitting: Medical-Surgical

## 2022-05-08 VITALS — BP 91/58 | HR 113 | Resp 20 | Ht 62.0 in | Wt 234.5 lb

## 2022-05-08 DIAGNOSIS — F99 Mental disorder, not otherwise specified: Secondary | ICD-10-CM

## 2022-05-08 DIAGNOSIS — Z0000 Encounter for general adult medical examination without abnormal findings: Secondary | ICD-10-CM | POA: Diagnosis not present

## 2022-05-08 DIAGNOSIS — N898 Other specified noninflammatory disorders of vagina: Secondary | ICD-10-CM

## 2022-05-08 DIAGNOSIS — H539 Unspecified visual disturbance: Secondary | ICD-10-CM

## 2022-05-08 DIAGNOSIS — R400 Somnolence: Secondary | ICD-10-CM

## 2022-05-08 DIAGNOSIS — R0683 Snoring: Secondary | ICD-10-CM | POA: Diagnosis not present

## 2022-05-08 DIAGNOSIS — N760 Acute vaginitis: Secondary | ICD-10-CM

## 2022-05-08 LAB — WET PREP FOR TRICH, YEAST, CLUE
MICRO NUMBER:: 14771781
Specimen Quality: ADEQUATE

## 2022-05-08 NOTE — Progress Notes (Signed)
Complete physical exam  Patient: Tasha Barrett   DOB: 1990/08/15   32 y.o. Female  MRN: [id number]  Subjective:    Chief Complaint  Patient presents with   Annual Exam   Tasha Barrett is a 32 y.o. female who presents today for a complete physical exam. She reports consuming a general diet.  Some exercise a few times a week.   She generally feels well. She reports sleeping well. She does not have additional problems to discuss today.   Sees psychiatry for treatment of anxiety/depression and ADHD.  Is interested in being tested for autism as her daughter also exhibits symptoms of being on the spectrum and is seeking testing for her too.  Would like a referral today.  Overdue for an eye exam and she has noticed significant visual changes.  Would like a referral to optometry/ophthalmology.  Having vaginal symptoms and a history of recurrent BV.  She recently completed a course of antibiotics and since then has been worried about recurrence of the BV.  She has boric acid suppositories that she has tried.  These do seem to help after a couple of days and her symptoms resolved however she is not sure if it truly clears it up or just temporarily fixes the situation.  Reports a strange vaginal odor as well as some thin milky discharge.  Has an appointment scheduled in June for sleep study through [facility] but she would like for me to try to get 1 sent through [facility] with a quicker turnaround time.  Has loud snoring and daytime fatigue.  Most recent fall risk assessment:    05/08/2022    3:45 PM  [place] in the past year? 0  Number falls in past yr: 0  Injury with Fall? 0  Risk for fall due to : No Fall Risks  Follow up Falls evaluation completed     Most recent depression screenings:    11/08/2021   10:22 AM 11/08/2021    9:45 AM  PHQ 2/9 Scores  PHQ - 2 Score 2 0  PHQ- 9 Score 8     Vision:Not within last year , Dental: Current dental problems and No regular dental care ,  and STD: The patient denies history of sexually transmitted disease.    Patient Care Team: Samuel Bouche, NP as PCP - General (Nurse Practitioner)   Outpatient Medications Prior to Visit  Medication Sig   amphetamine-dextroamphetamine (ADDERALL XR) 10 MG 24 hr capsule Take 10 mg by mouth every morning.   venlafaxine XR (EFFEXOR-XR) 75 MG 24 hr capsule Take 75 mg by mouth at bedtime.   No facility-administered medications prior to visit.    Review of Systems  Constitutional:  Negative for chills, fever, malaise/fatigue and weight loss.  HENT:  Negative for congestion, ear pain, hearing loss, sinus pain and sore throat.   Eyes:  Negative for blurred vision, photophobia and pain.  Respiratory:  Negative for cough, shortness of breath and wheezing.   Cardiovascular:  Negative for chest pain, palpitations and leg swelling.  Gastrointestinal:  Negative for abdominal pain, constipation, diarrhea, heartburn, nausea and vomiting.  Genitourinary:  Negative for dysuria, frequency and urgency.       Vaginal odor   Musculoskeletal:  Negative for falls and neck pain.  Skin:  Negative for itching and rash.  Neurological:  Negative for dizziness, weakness and headaches.  Endo/Heme/Allergies:  Negative for polydipsia. Does not bruise/bleed easily.  Psychiatric/Behavioral:  Positive for depression. Negative for substance  abuse and suicidal ideas. The patient is nervous/anxious. The patient does not have insomnia.      Objective:    BP (!) 91/58 (BP Location: Right Arm, Cuff Size: Normal)   Pulse (!) 113   Resp 20   Ht 5\' 2"  (1.575 m)   Wt 234 lb 8 oz (106.4 kg)   LMP 04/21/2022 (Approximate)   SpO2 98%   BMI 42.89 kg/m    Physical Exam Constitutional:      General: She is not in acute distress.    Appearance: Normal appearance. She is obese. She is not ill-appearing.  HENT:     Head: Normocephalic and atraumatic.     Right Ear: Tympanic membrane, ear canal and external ear normal. There  is no impacted cerumen.     Left Ear: Tympanic membrane, ear canal and external ear normal. There is no impacted cerumen.     Nose: Nose normal. No congestion or rhinorrhea.     Mouth/Throat:     Mouth: Mucous membranes are moist.     Pharynx: No oropharyngeal exudate or posterior oropharyngeal erythema.  Eyes:     General: No scleral icterus.       Right eye: No discharge.        Left eye: No discharge.     Extraocular Movements: Extraocular movements intact.     Conjunctiva/sclera: Conjunctivae normal.     Pupils: Pupils are equal, round, and reactive to light.  Neck:     Thyroid: No thyromegaly.     Vascular: No carotid bruit or JVD.     Trachea: Trachea normal.  Cardiovascular:     Rate and Rhythm: Normal rate and regular rhythm.     Pulses: Normal pulses.     Heart sounds: Normal heart sounds. No murmur heard.    No friction rub. No gallop.  Pulmonary:     Effort: Pulmonary effort is normal. No respiratory distress.     Breath sounds: Normal breath sounds. No wheezing.  Abdominal:     General: Bowel sounds are normal. There is no distension.     Palpations: Abdomen is soft.     Tenderness: There is no abdominal tenderness. There is no guarding.  Musculoskeletal:        General: Normal range of motion.     Cervical back: Normal range of motion and neck supple.  Lymphadenopathy:     Cervical: No cervical adenopathy.  Skin:    General: Skin is warm and dry.  Neurological:     Mental Status: She is alert and oriented to person, place, and time.     Cranial Nerves: No cranial nerve deficit.  Psychiatric:        Mood and Affect: Mood normal.        Behavior: Behavior normal.        Thought Content: Thought content normal.        Judgment: Judgment normal.   No results found for any visits on 05/08/22.     Assessment & Plan:    Routine Health Maintenance and Physical Exam  Immunization History  Administered Date(s) Administered   Influenza,inj,Quad PF,6+ Mos  11/20/2013, 02/19/2018, 10/22/2019   Moderna Sars-Covid-2 Vaccination 04/17/2019, 05/31/2019, 02/29/2020   PPD Test 09/20/2014, 09/11/2018   Tdap 12/11/2013    Health Maintenance  Topic Date Due   COVID-19 Vaccine (4 - 2023-24 season) 05/24/2022 (Originally 10/06/2021)   INFLUENZA VACCINE  09/06/2022   PAP SMEAR-Modifier  08/31/2023   DTaP/Tdap/Td (2 - Td or Tdap)  12/12/2023   Hepatitis C Screening  Completed   HIV Screening  Completed   HPV VACCINES  Aged Out    Discussed health benefits of physical activity, and encouraged her to engage in regular exercise appropriate for her age and condition.  1. Annual physical exam Deferring labs per patient request as these have been done in January as well as March.  Overdue on preventative care.  Recommend updating vision and dental care.  Wellness information provided with AVS.  2. Loud snoring 3. Daytime sleepiness STOP-BANG score shows intermediate risk of sleep apnea.  Home sleep study ordered. - Home sleep test  4. Vaginal odor Stat wet prep sent. - WET PREP FOR [place], YEAST, CLUE  5. Vision changes Referring to ophthalmology. - Ambulatory referral to Ophthalmology  6. Mental health disorder She already has a psychiatrist on board but is interested in pursuing further testing.  Referring to psychology. - Ambulatory referral to Psychology  Return in about 1 year (around 05/08/2023) for annual physical exam.   Samuel Bouche, NP

## 2022-05-09 MED ORDER — METRONIDAZOLE 500 MG PO TABS: 500.0000 mg | ORAL_TABLET | Freq: Two times a day (BID) | ORAL | 0 refills | Status: AC

## 2022-05-09 NOTE — Addendum Note (Signed)
Addended bySamuel Bouche on: 05/09/2022 06:56 AM   Modules accepted: Orders

## 2022-05-14 ENCOUNTER — Encounter: Payer: Self-pay | Admitting: Bariatrics

## 2022-05-14 ENCOUNTER — Ambulatory Visit (INDEPENDENT_AMBULATORY_CARE_PROVIDER_SITE_OTHER): Payer: Medicaid Other | Admitting: Bariatrics

## 2022-05-14 VITALS — BP 121/78 | HR 69 | Temp 98.6°F | Ht 62.0 in | Wt 231.0 lb

## 2022-05-14 DIAGNOSIS — R5383 Other fatigue: Secondary | ICD-10-CM

## 2022-05-14 DIAGNOSIS — E559 Vitamin D deficiency, unspecified: Secondary | ICD-10-CM | POA: Diagnosis not present

## 2022-05-14 DIAGNOSIS — Z6841 Body Mass Index (BMI) 40.0 and over, adult: Secondary | ICD-10-CM | POA: Diagnosis not present

## 2022-05-14 DIAGNOSIS — Z1331 Encounter for screening for depression: Secondary | ICD-10-CM

## 2022-05-14 DIAGNOSIS — R0602 Shortness of breath: Secondary | ICD-10-CM | POA: Diagnosis not present

## 2022-05-14 DIAGNOSIS — R7303 Prediabetes: Secondary | ICD-10-CM

## 2022-05-14 DIAGNOSIS — F509 Eating disorder, unspecified: Secondary | ICD-10-CM | POA: Insufficient documentation

## 2022-05-14 DIAGNOSIS — E739 Lactose intolerance, unspecified: Secondary | ICD-10-CM | POA: Diagnosis not present

## 2022-05-14 DIAGNOSIS — F5089 Other specified eating disorder: Secondary | ICD-10-CM

## 2022-05-15 LAB — TSH+T4F+T3FREE
Free T4: 0.93 ng/dL (ref 0.82–1.77)
T3, Free: 2.7 pg/mL (ref 2.0–4.4)
TSH: 0.631 u[IU]/mL (ref 0.450–4.500)

## 2022-05-15 LAB — COMPREHENSIVE METABOLIC PANEL
ALT: 13 IU/L (ref 0–32)
AST: 17 IU/L (ref 0–40)
Albumin/Globulin Ratio: 1.2 (ref 1.2–2.2)
Albumin: 4 g/dL (ref 3.9–4.9)
Alkaline Phosphatase: 79 IU/L (ref 44–121)
BUN/Creatinine Ratio: 6 — ABNORMAL LOW (ref 9–23)
BUN: 5 mg/dL — ABNORMAL LOW (ref 6–20)
Bilirubin Total: 0.3 mg/dL (ref 0.0–1.2)
CO2: 22 mmol/L (ref 20–29)
Calcium: 9 mg/dL (ref 8.7–10.2)
Chloride: 103 mmol/L (ref 96–106)
Creatinine, Ser: 0.86 mg/dL (ref 0.57–1.00)
Globulin, Total: 3.4 g/dL (ref 1.5–4.5)
Glucose: 82 mg/dL (ref 70–99)
Potassium: 4.2 mmol/L (ref 3.5–5.2)
Sodium: 138 mmol/L (ref 134–144)
Total Protein: 7.4 g/dL (ref 6.0–8.5)
eGFR: 93 mL/min/{1.73_m2} (ref >59)

## 2022-05-15 LAB — VITAMIN D 25 HYDROXY (VIT D DEFICIENCY, FRACTURES): Vit D, 25-Hydroxy: 8.9 ng/mL — ABNORMAL LOW (ref 30.0–100.0)

## 2022-05-15 LAB — LIPID PANEL WITH LDL/HDL RATIO
Cholesterol, Total: 147 mg/dL (ref 100–199)
HDL: 43 mg/dL (ref >39)
LDL Chol Calc (NIH): 93 mg/dL (ref 0–99)
LDL/HDL Ratio: 2.2 ratio (ref 0.0–3.2)
Triglycerides: 51 mg/dL (ref 0–149)
VLDL Cholesterol Cal: 11 mg/dL (ref 5–40)

## 2022-05-15 LAB — INSULIN, RANDOM: INSULIN: 12.2 u[IU]/mL (ref 2.6–24.9)

## 2022-05-15 LAB — HEMOGLOBIN A1C
Est. average glucose Bld gHb Est-mCnc: 105 mg/dL
Hgb A1c MFr Bld: 5.3 % (ref 4.8–5.6)

## 2022-05-15 NOTE — Progress Notes (Unsigned)
Chief Complaint:   OBESITY Tasha Barrett (MR# [id number]) is a 32 y.o. female who presents for evaluation and treatment of obesity and related comorbidities. Current BMI is Body mass index is 42.25 kg/m. Tasha Barrett has been struggling with her weight for many years and has been unsuccessful in either losing weight, maintaining weight loss, or reaching her healthy weight goal.  Tasha Barrett is here for her initial visit. She saw Irene Limbo, FNP-C for and information session.   Tasha Barrett is currently in the action stage of change and ready to dedicate time achieving and maintaining a healthier weight. Tasha Barrett is interested in becoming our patient and working on intensive lifestyle modifications including (but not limited to) diet and exercise for weight loss.  Tasha Barrett's habits were reviewed today and are as follows: Her family eats meals together, she thinks her family will eat healthier with her, her desired weight loss is 71-91 lbs, she has been heavy most of her life, she started gaining weight in high school, her heaviest weight ever was 261 pounds, she has significant food cravings issues, she snacks frequently in the evenings, she skips meals frequently, she is frequently drinking liquids with calories, she frequently makes poor food choices, she frequently eats larger portions than normal, she has binge eating behaviors, and she struggles with emotional eating.  Depression Screen Tasha Barrett's Food and Mood (modified PHQ-9) score was 20.  Subjective:   1. Other fatigue Tasha Barrett admits to daytime somnolence and admits to waking up still tired. Patient has a history of symptoms of daytime fatigue, morning fatigue, and morning headache. [name] generally gets 4 or 6 hours of sleep per night, and states that she has nightime awakenings. Snoring is present. Apneic episodes are present. Epworth Sleepiness Score is 8.   2. SOB (shortness of breath) on exertion Tasha Barrett notes  increasing shortness of breath with exercising and seems to be worsening over time with weight gain. She notes getting out of breath sooner with activity than she used to. This has not gotten worse recently. Tasha Barrett denies shortness of breath at rest or orthopnea.  3. Pre-diabetes Tasha Barrett's initial diagnosis was years ago.  4. Vitamin D deficiency Tasha Barrett is not on vitamin D currently.  5. Lactose intolerance Tasha Barrett notes GI problems with milk and milk products.  6. Other disorder of eating Tasha Barrett notes binge eating behavior.   Assessment/Plan:   1. Other fatigue Tasha Barrett does feel that her weight is causing her energy to be lower than it should be. Fatigue may be related to obesity, depression or many other causes. Labs will be ordered, and in the meanwhile, [name] will focus on self care including making healthy food choices, increasing physical activity and focusing on stress reduction.  - TSH+T4F+T3Free  2. SOB (shortness of breath) on exertion [name] does feel that she gets out of breath more easily that she used to when she exercises. Tasha Barrett shortness of breath appears to be obesity related and exercise induced. She has agreed to work on weight loss and gradually increase exercise to treat her exercise induced shortness of breath. Will continue to monitor closely.  - TSH+T4F+T3Free  3. Pre-diabetes We will check labs today, and we will follow-up at [facility] next visit.   - Hemoglobin A1c - Insulin, random - Comprehensive metabolic panel  4. Vitamin D deficiency We will check labs today, and we will follow-up at [facility] next visit.  - VITAMIN D 25 Hydroxy (Vit-D Deficiency, Fractures)  5. Lactose intolerance Tasha Barrett will substitute for lactose-free  foods.  6. Other disorder of eating Tasha Barrett was referred to Dr. Dewaine Conger, our Bariatric Psychologist for evaluation.  7. Depression screening Tasha Barrett had a positive depression screening. Depression is  commonly associated with obesity and often results in emotional eating behaviors. We will monitor this closely and work on CBT to help improve the non-hunger eating patterns. Referral to Psychology may be required if no improvement is seen as she continues in our clinic.  8. Morbid obesity - Hemoglobin A1c - Insulin, random - Lipid Panel With LDL/HDL Ratio - VITAMIN D 25 Hydroxy (Vit-D Deficiency, Fractures) - Comprehensive metabolic panel - NOI+[id number]  9. BMI 40.0-44.9, adult We will check labs today.   - Hemoglobin A1c - Insulin, random - Lipid Panel With LDL/HDL Ratio - VITAMIN D 25 Hydroxy (Vit-D Deficiency, Fractures) - Comprehensive metabolic panel - [id number]  Tasha Barrett is currently in the action stage of change and her goal is to continue with weight loss efforts. I recommend Tasha Barrett begin the structured treatment plan as follows:  She has agreed to the Category 2 Plan and keeping a food journal and adhering to recommended goals of 1200 calories and 80-90 grams of protein.  Meal planning and intentional eating were discussed.   Exercise goals: No exercise has been prescribed at this time.   Behavioral modification strategies: increasing lean protein intake, decreasing simple carbohydrates, increasing vegetables, increasing water intake, decreasing eating out, no skipping meals, meal planning and cooking strategies, keeping healthy foods in the home, and planning for success.  She was informed of the importance of frequent follow-up visits to maximize her success with intensive lifestyle modifications for her multiple health conditions. She was informed we would discuss her lab results at her next visit unless there is a critical issue that needs to be addressed sooner. Tasha Barrett agreed to keep her next visit at the agreed upon time to discuss these results.  Objective:   Blood pressure 121/78, pulse 69, temperature 98.6 F (37 C), height 5\' 2"  (1.575 m), weight 231  lb (104.8 kg), last menstrual period 04/21/2022, SpO2 100 %. Body mass index is 42.25 kg/m.  EKG: Normal sinus rhythm, rate 98 BPM.  Indirect Calorimeter completed today shows a VO2 of 235 and a REE of 1627.  Her calculated basal metabolic rate is 0349 thus her basal metabolic rate is worse than expected.  General: Cooperative, alert, well developed, in no acute distress. HEENT: Conjunctivae and lids unremarkable. Cardiovascular: Regular rhythm.  Lungs: Normal work of breathing. Neurologic: No focal deficits.   Lab Results  Component Value Date   CREATININE 0.81 04/12/2022   BUN 17 04/12/2022   NA 135 04/12/2022   K 3.9 04/12/2022   CL 105 04/12/2022   CO2 24 04/12/2022   Lab Results  Component Value Date   ALT 11 04/12/2022   AST 15 04/12/2022   ALKPHOS 62 04/12/2022   BILITOT 0.6 04/12/2022   Lab Results  Component Value Date   HGBA1C CANCELED 08/30/2020   HGBA1C 5.5 08/30/2020   HGBA1C 5.4 10/22/2017   HGBA1C 5.5 06/29/2015   HGBA1C 5.9 (H) 06/27/2015   No results found for: "INSULIN" Lab Results  Component Value Date   TSH CANCELED 08/30/2020   Lab Results  Component Value Date   CHOL 151 08/30/2020   HDL 46 (L) 08/30/2020   LDLCALC CANCELED 08/30/2020   TRIG 46 08/30/2020   CHOLHDL 3.3 08/30/2020   Lab Results  Component Value Date   WBC 9.3 04/12/2022   HGB 10.1 (L) 04/12/2022  HCT 33.8 (L) 04/12/2022   MCV 81.4 04/12/2022   PLT 263 04/12/2022   Lab Results  Component Value Date   IRON 28 (L) 08/30/2020   TIBC 316 08/30/2020   FERRITIN 19 08/30/2020   Attestation Statements:   Reviewed by clinician on day of visit: allergies, medications, problem list, medical history, surgical history, family history, social history, and previous encounter notes.   Time spent on visit including pre-visit chart review and post-visit charting and care was 40 minutes.     Trude McburneyI, Sharon Martin, am acting as Energy managertranscriptionist for [employer] Energy[employer]el Kiegan Macaraeg, DO.  I have  reviewed the above documentation for accuracy and completeness, and I agree with the above. Corinna Capra- Merion Caton, DO

## 2022-05-16 ENCOUNTER — Encounter: Payer: Self-pay | Admitting: Bariatrics

## 2022-05-29 ENCOUNTER — Ambulatory Visit: Payer: Medicaid Other | Admitting: Nurse Practitioner

## 2022-05-29 ENCOUNTER — Encounter: Payer: Self-pay | Admitting: Nurse Practitioner

## 2022-05-29 VITALS — BP 135/85 | HR 82 | Temp 98.1°F | Ht 62.0 in | Wt 233.0 lb

## 2022-05-29 DIAGNOSIS — Z6841 Body Mass Index (BMI) 40.0 and over, adult: Secondary | ICD-10-CM

## 2022-05-29 DIAGNOSIS — E559 Vitamin D deficiency, unspecified: Secondary | ICD-10-CM

## 2022-05-29 MED ORDER — VITAMIN D (ERGOCALCIFEROL) 1.25 MG (50000 UNIT) PO CAPS: 50000.0000 [IU] | ORAL_CAPSULE | ORAL | 0 refills | Status: DC

## 2022-05-29 NOTE — Patient Instructions (Signed)

## 2022-05-29 NOTE — Progress Notes (Signed)
Office: [phone]  /  Fax: [phone]  WEIGHT SUMMARY AND BIOMETRICS  No data recorded Weight Gained Since Last Visit: 2LB   Vitals Temp: 98.1 F (36.7 C) BP: 135/85 Pulse Rate: 82 SpO2: 100 %   Anthropometric Measurements Height:  (1.575 m) Weight: 233 lb (105.7 kg) BMI (Calculated): 42.61 Weight at Last Visit: 231LB Weight Gained Since Last Visit: 2LB Starting Weight: 231LB Total Weight Loss (lbs): 0 lb (0 kg) Waist Measurement : 46 inches   Body Composition  Body Fat %: 40.1 % Fat Mass (lbs): 93.4 lbs Muscle Mass (lbs): 132.6 lbs Total Body Water (lbs): 90.4 lbs Visceral Fat Rating : 10   Other Clinical Data Fasting: No Today's Visit #: 2 Starting Date: 05/14/22     HPI  Chief Complaint: OBESITY  Tasha Barrett is here to discuss her progress with her obesity treatment plan. She is on the the Category 2 Plan and states she is following her eating plan approximately 80-90 % of the time. She states she is exercising 30 minutes 4 days per week.   Interval History:  Since last office visit she has gained 2 pounds.  She is tracking using myfitness pal. She is averaging around 1400-1600 calories and 60 grams of protein daily. She struggles with hunger if she eats less then 1400 calories.  She really struggles with hunger if she eats less then 1200 calories.   She thinks about food all the time.  She does well during the week but really struggles on the weekend. She is weighing and measuring her food.  She is drinking water daily.  She reports a history of binge eating.  Took Vyvanse but had to stop due to side effects.  She notes she is lactose intolerant.     Pharmacotherapy for weight loss: She is not currently taking medications  for medical weight loss.    Previous pharmacotherapy for medical weight loss:  Vyvanse for binge eating .  Stopped due to side effects.   Bariatric surgery:  Patient has not had bariatric surgery.    Vit D deficiency  She  is not currently taking Vit D or MV.    Lab Results  Component Value Date   VD25OH 8.9 (L) 05/14/2022     PHYSICAL EXAM:  Blood pressure 135/85, pulse 82, temperature 98.1 F (36.7 C), height  (1.575 m), weight 233 lb (105.7 kg), last menstrual period 04/21/2022, SpO2 100 %. Body mass index is 42.62 kg/m.  General: She is overweight, cooperative, alert, well developed, and in no acute distress. PSYCH: Has normal mood, affect and thought process.   Extremities: No edema.  Neurologic: No gross sensory or motor deficits. No tremors or fasciculations noted.    DIAGNOSTIC DATA REVIEWED:  BMET    Component Value Date/Time   NA 138 05/14/2022 1210   K 4.2 05/14/2022 1210   CL 103 05/14/2022 1210   CO2 22 05/14/2022 1210   GLUCOSE 82 05/14/2022 1210   GLUCOSE 113 (H) 04/12/2022 0655   BUN 5 (L) 05/14/2022 1210   CREATININE 0.86 05/14/2022 1210   CREATININE 0.80 08/30/2020 0000   CALCIUM 9.0 05/14/2022 1210   GFRNONAA >60 04/12/2022 0655   GFRNONAA 97 10/22/2017 1129   GFRAA 112 10/22/2017 1129   Lab Results  Component Value Date   HGBA1C 5.3 05/14/2022   HGBA1C 5.5 09/24/2014   Lab Results  Component Value Date   INSULIN 12.2 05/14/2022   Lab Results  Component Value Date   TSH  0.631 05/14/2022   CBC    Component Value Date/Time   WBC 9.3 04/12/2022 0655   RBC 4.15 04/12/2022 0655   HGB 10.1 (L) 04/12/2022 0655   HCT 33.8 (L) 04/12/2022 0655   PLT 263 04/12/2022 0655   MCV 81.4 04/12/2022 0655   MCH 24.3 (L) 04/12/2022 0655   MCHC 29.9 (L) 04/12/2022 0655   RDW 14.7 04/12/2022 0655   Iron Studies    Component Value Date/Time   IRON 28 (L) 08/30/2020 0000   TIBC 316 08/30/2020 0000   FERRITIN 19 08/30/2020 0000   IRONPCTSAT 9 (L) 08/30/2020 0000   Lipid Panel     Component Value Date/Time   CHOL 147 05/14/2022 1210   TRIG 51 05/14/2022 1210   HDL 43 05/14/2022 1210   CHOLHDL 3.3 08/30/2020 0000   VLDL 16 05/13/2015 0614   LDLCALC 93  05/14/2022 1210   LDLCALC CANCELED 08/30/2020 0928   Hepatic Function Panel     Component Value Date/Time   PROT 7.4 05/14/2022 1210   ALBUMIN 4.0 05/14/2022 1210   AST 17 05/14/2022 1210   ALT 13 05/14/2022 1210   ALKPHOS 79 05/14/2022 1210   BILITOT 0.3 05/14/2022 1210   BILIDIR <0.1 02/11/2011 2355   IBILI NOT CALCULATED 02/11/2011 2355      Component Value Date/Time   TSH 0.631 05/14/2022 1210   Nutritional Lab Results  Component Value Date   VD25OH 8.9 (L) 05/14/2022     ASSESSMENT AND PLAN  TREATMENT PLAN FOR OBESITY:  Recommended Dietary Goals  Tasha Barrett is currently in the action stage of change. As such, her goal is to continue weight management plan. She has agreed to keeping a food journal and adhering to recommended goals of 1400-1500 calories and 75+ protein. Patient would do better at this time eating 1400-1500 calories.  Based upon how she is doing at her next visit, I will adjust her calories and proteins.  She is eating a lot of fatty foods and we discussed this extensively today.  She will try to aim to eat more protein.  She is not eating enough protein and this is contributing to her hunger.  We also discussed fruits-sugar contents today and fruits to aim to eat in place of bananas, grapes, etc.  Fruit handout given.  I think if she is able to replace her fatty foods with protein she will overall feel better and in return will help with her weight loss goals.    Behavioral Intervention  We discussed the following Behavioral Modification Strategies today: increasing lean protein intake, decreasing simple carbohydrates , increasing vegetables, increasing lower glycemic fruits, increasing fiber rich foods, avoiding skipping meals, increasing water intake, continue to practice mindfulness when eating, and planning for success.  Additional resources provided today: NA  Recommended Physical Activity Goals  Tasha Barrett has been advised to work up to 150 minutes of  moderate intensity aerobic activity a week and strengthening exercises 2-3 times per week for cardiovascular health, weight loss maintenance and preservation of muscle mass.   She has agreed to Continue current level of physical activity    ASSOCIATED CONDITIONS ADDRESSED TODAY  Action/Plan  Vitamin D deficiency -     Start Vitamin D (Ergocalciferol); Take 1 capsule (50,000 Units total) by mouth every 7 (seven) days.  Dispense: 5 capsule; Refill: 0. Side effects discussed.    Low Vitamin D level contributes to fatigue and are associated with obesity, breast, and colon cancer. She agrees to continue to take prescription  Vitamin D @50 ,000 IU every week and will follow-up for routine testing of Vitamin D, at least 2-3 times per year to avoid over-replacement.   Morbid obesity  BMI 40.0-44.9, adult     She has been referred to neuro for eval for OSAS  Labs reviewed in chart from 05/14/22  Return in about 3 weeks (around 06/19/2022).Marland Kitchen She was informed of the importance of frequent follow up visits to maximize her success with intensive lifestyle modifications for her multiple health conditions.   ATTESTASTION STATEMENTS:  Reviewed by clinician on day of visit: allergies, medications, problem list, medical history, surgical history, family history, social history, and previous encounter notes.     Tasha Barrett. Tasha Crabbe FNP-C

## 2022-06-06 DIAGNOSIS — Z419 Encounter for procedure for purposes other than remedying health state, unspecified: Secondary | ICD-10-CM | POA: Diagnosis not present

## 2022-06-11 DIAGNOSIS — F331 Major depressive disorder, recurrent, moderate: Secondary | ICD-10-CM | POA: Diagnosis not present

## 2022-06-11 DIAGNOSIS — F900 Attention-deficit hyperactivity disorder, predominantly inattentive type: Secondary | ICD-10-CM | POA: Diagnosis not present

## 2022-06-13 DIAGNOSIS — F331 Major depressive disorder, recurrent, moderate: Secondary | ICD-10-CM | POA: Diagnosis not present

## 2022-06-13 DIAGNOSIS — F900 Attention-deficit hyperactivity disorder, predominantly inattentive type: Secondary | ICD-10-CM | POA: Diagnosis not present

## 2022-06-19 ENCOUNTER — Encounter: Payer: Self-pay | Admitting: Nurse Practitioner

## 2022-06-19 ENCOUNTER — Ambulatory Visit (INDEPENDENT_AMBULATORY_CARE_PROVIDER_SITE_OTHER): Payer: Medicaid Other | Admitting: Nurse Practitioner

## 2022-06-19 VITALS — BP 127/76 | HR 85 | Temp 98.4°F | Ht 62.0 in | Wt 227.0 lb

## 2022-06-19 DIAGNOSIS — E559 Vitamin D deficiency, unspecified: Secondary | ICD-10-CM | POA: Diagnosis not present

## 2022-06-19 DIAGNOSIS — Z6841 Body Mass Index (BMI) 40.0 and over, adult: Secondary | ICD-10-CM | POA: Diagnosis not present

## 2022-06-19 DIAGNOSIS — F411 Generalized anxiety disorder: Secondary | ICD-10-CM | POA: Diagnosis not present

## 2022-06-19 NOTE — Progress Notes (Signed)
Office: [phone]  /  Fax: [phone]  WEIGHT SUMMARY AND BIOMETRICS  Weight Lost Since Last Visit: 6lb  No data recorded  Vitals Temp: 98.4 F (36.9 C) BP: 127/76 Pulse Rate: 85 SpO2: 99 %   Anthropometric Measurements Height: 5\' 2"  (1.575 m) Weight: 227 lb (103 kg) BMI (Calculated): 41.51 Weight at Last Visit: 233lb Weight Lost Since Last Visit: 6lb Starting Weight: 231lb Total Weight Loss (lbs): 4 lb (1.814 kg) Waist Measurement : 44 inches   Body Composition  Body Fat %: 41.7 % Fat Mass (lbs): 95 lbs Muscle Mass (lbs): 126 lbs Total Body Water (lbs): 89 lbs Visceral Fat Rating : 11   Other Clinical Data Fasting: No Labs: No Today's Visit #: 3 Starting Date: 05/14/22     HPI  Chief Complaint: OBESITY  Tasha Barrett is here to discuss her progress with her obesity treatment plan. She is on the the Category 2 Plan and states she is following her eating plan approximately 50 % of the time. She states she is exercising 0 minutes 0 days per week.   Interval History:  Since last office visit she has lost 6 pounds.  She has been under stress since her last visit.  She is not stress eating, is skipping some meals.  Trying to focus on eating more protein. She hasn't been tracking since 06/01/22.  Has appt with her therapist today.     Pharmacotherapy for weight loss: She is not currently taking medications  for medical weight loss.  Denies side effects.    Previous pharmacotherapy for medical weight loss:  Vyvanse for binge eating .  Stopped due to side effects.   Bariatric surgery:  Has not had bariatric surgery.   Vit D deficiency  She is taking Vit D 50,000 IU weekly.     Lab Results  Component Value Date   VD25OH 8.9 (L) 05/14/2022    PHYSICAL EXAM:  Blood pressure 127/76, pulse 85, temperature 98.4 F (36.9 C), height 5\' 2"  (1.575 m), weight 227 lb (103 kg), last menstrual period 06/12/2022, SpO2 99 %. Body mass index is 41.52  kg/m.  General: She is overweight, cooperative, alert, well developed, and in no acute distress. PSYCH: Has normal mood, affect and thought process.   Extremities: No edema.  Neurologic: No gross sensory or motor deficits. No tremors or fasciculations noted.    DIAGNOSTIC DATA REVIEWED:  BMET    Component Value Date/Time   NA 138 05/14/2022 1210   K 4.2 05/14/2022 1210   CL 103 05/14/2022 1210   CO2 22 05/14/2022 1210   GLUCOSE 82 05/14/2022 1210   GLUCOSE 113 (H) 04/12/2022 0655   BUN 5 (L) 05/14/2022 1210   CREATININE 0.86 05/14/2022 1210   CREATININE 0.80 08/30/2020 0000   CALCIUM 9.0 05/14/2022 1210   GFRNONAA >60 04/12/2022 0655   GFRNONAA 97 10/22/2017 1129   GFRAA 112 10/22/2017 1129   Lab Results  Component Value Date   HGBA1C 5.3 05/14/2022   HGBA1C 5.5 09/24/2014   Lab Results  Component Value Date   INSULIN 12.2 05/14/2022   Lab Results  Component Value Date   TSH 0.631 05/14/2022   CBC    Component Value Date/Time   WBC 9.3 04/12/2022 0655   RBC 4.15 04/12/2022 0655   HGB 10.1 (L) 04/12/2022 0655   HCT 33.8 (L) 04/12/2022 0655   PLT 263 04/12/2022 0655   MCV 81.4 04/12/2022 0655   MCH 24.3 (L) 04/12/2022 0655   MCHC 29.9 (  L) 04/12/2022 0655   RDW 14.7 04/12/2022 0655   Iron Studies    Component Value Date/Time   IRON 28 (L) 08/30/2020 0000   TIBC 316 08/30/2020 0000   FERRITIN 19 08/30/2020 0000   IRONPCTSAT 9 (L) 08/30/2020 0000   Lipid Panel     Component Value Date/Time   CHOL 147 05/14/2022 1210   TRIG 51 05/14/2022 1210   HDL 43 05/14/2022 1210   CHOLHDL 3.3 08/30/2020 0000   VLDL 16 05/13/2015 0614   LDLCALC 93 05/14/2022 1210   LDLCALC CANCELED 08/30/2020 0928   Hepatic Function Panel     Component Value Date/Time   PROT 7.4 05/14/2022 1210   ALBUMIN 4.0 05/14/2022 1210   AST 17 05/14/2022 1210   ALT 13 05/14/2022 1210   ALKPHOS 79 05/14/2022 1210   BILITOT 0.3 05/14/2022 1210   BILIDIR <0.1 02/11/2011 2355   IBILI  NOT CALCULATED 02/11/2011 2355      Component Value Date/Time   TSH 0.631 05/14/2022 1210   Nutritional Lab Results  Component Value Date   VD25OH 8.9 (L) 05/14/2022     ASSESSMENT AND PLAN  TREATMENT PLAN FOR OBESITY:  Recommended Dietary Goals  Tasha Barrett is currently in the action stage of change. As such, her goal is to continue weight management plan. She has agreed to the Category 2 Plan.  Behavioral Intervention  We discussed the following Behavioral Modification Strategies today: increasing lean protein intake, decreasing simple carbohydrates , increasing vegetables, increasing lower glycemic fruits, increasing fiber rich foods, avoiding skipping meals, increasing water intake, reading food labels , keeping healthy foods at home, continue to practice mindfulness when eating, and planning for success.  Additional resources provided today: NA  Recommended Physical Activity Goals  Tasha Barrett has been advised to work up to 150 minutes of moderate intensity aerobic activity a week and strengthening exercises 2-3 times per week for cardiovascular health, weight loss maintenance and preservation of muscle mass.   She has agreed to Think about ways to increase daily physical activity and overcoming barriers to exercise, Increase physical activity in their day and reduce sedentary time (increase NEAT)., and Work on scheduling and tracking physical activity.    Pharmacotherapy Patient is not a candidate for Phentermine or Qsymia due to taking Adderall XR.   Unable to take GLP-1 due to cost.  ASSOCIATED CONDITIONS ADDRESSED TODAY  Action/Plan  Vitamin D deficiency Take Vit D as directed  Low Vitamin D level contributes to fatigue and are associated with obesity, breast, and colon cancer. She agrees to continue to take prescription Vitamin D @50 ,000 IU every week and will follow-up for routine testing of Vitamin D, at least 2-3 times per year to avoid over-replacement.    Morbid obesity (HCC)  BMI 40.0-44.9, adult [facility])     Has appt with therapist today and wasn't able to stay for entire appt.    Patient had questions today about bariatric surgery.  She is interested in SG.  All questions answered.    Return in about 2 weeks (around 07/03/2022).Marland Kitchen She was informed of the importance of frequent follow up visits to maximize her success with intensive lifestyle modifications for her multiple health conditions.   ATTESTASTION STATEMENTS:  Reviewed by clinician on day of visit: allergies, medications, problem list, medical history, surgical history, family history, social history, and previous encounter notes.   Time spent on visit including pre-visit chart review and post-visit care and charting was 20 minutes.    Theodis Sato. Jozsef Wescoat FNP-C

## 2022-06-21 ENCOUNTER — Ambulatory Visit
Admission: EM | Admit: 2022-06-21 | Discharge: 2022-06-21 | Disposition: A | Discharge status: Home / Self Care | Payer: Medicaid Other

## 2022-06-21 ENCOUNTER — Encounter: Payer: Self-pay | Admitting: Emergency Medicine

## 2022-06-21 DIAGNOSIS — R1013 Epigastric pain: Secondary | ICD-10-CM | POA: Diagnosis not present

## 2022-06-21 DIAGNOSIS — K2100 Gastro-esophageal reflux disease with esophagitis, without bleeding: Secondary | ICD-10-CM | POA: Diagnosis not present

## 2022-06-21 MED ORDER — LIDOCAINE VISCOUS HCL 2 % MT SOLN
15.0000 mL | Freq: Once | OROMUCOSAL | Status: AC
  Administered 2022-06-21: 15 mL via OROMUCOSAL

## 2022-06-21 MED ORDER — ONDANSETRON 4 MG PO TBDP: 4.0000 mg | ORAL_TABLET | Freq: Three times a day (TID) | ORAL | 0 refills | Status: DC | PRN

## 2022-06-21 MED ORDER — OMEPRAZOLE 20 MG PO CPDR: 20.0000 mg | DELAYED_RELEASE_CAPSULE | Freq: Every day | ORAL | 0 refills | Status: DC

## 2022-06-21 MED ORDER — ALUM & MAG HYDROXIDE-SIMETH 200-200-20 MG/5ML PO SUSP
15.0000 mL | Freq: Once | ORAL | Status: AC
  Administered 2022-06-21: 15 mL via ORAL

## 2022-06-21 NOTE — ED Notes (Signed)
Patient given GI cocktail and was unable to tolerate mixture.  She did drink a large sip and voiced she cannot drink anything else.  Provider informed.

## 2022-06-21 NOTE — Discharge Instructions (Signed)
Take prescribed medicines as directed. Medicine will help reduce the amount of acid your stomach makes and therefore improve your reflux symptoms related to acid production.   Avoid spicy or acidic foods like tomatoes, chocolate, coffee, or acidic fruits like oranges as these can trigger symptoms.  I have included acid reflux education in your packet for your review. Please also allow 2 hours after meals before lying flat to help prevent symptoms.   If your symptoms do not improve in the next 5-7 days with interventions, please return. Go to the emergency room for severe symptoms of shortness of breath, worsening or uncontrolled abdominal or chest pain, headache, light headedness, feeling faint, nausea, vomiting, bloody vomit or stools, black tarry stools, or any other new/severe symptoms. I hope you feel better!   
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
## 2022-06-21 NOTE — ED Triage Notes (Signed)
Patient c/o epigastric pain last night, very sharp, nausea.  Patient recently started a new medication called Stratera and Clonidine.  Not sure if it's heartburn.  Has taken Pepto Bismol this am.

## 2022-06-25 DIAGNOSIS — F900 Attention-deficit hyperactivity disorder, predominantly inattentive type: Secondary | ICD-10-CM | POA: Diagnosis not present

## 2022-06-25 DIAGNOSIS — F4312 Post-traumatic stress disorder, chronic: Secondary | ICD-10-CM | POA: Diagnosis not present

## 2022-06-25 DIAGNOSIS — F331 Major depressive disorder, recurrent, moderate: Secondary | ICD-10-CM | POA: Diagnosis not present

## 2022-06-26 DIAGNOSIS — F411 Generalized anxiety disorder: Secondary | ICD-10-CM | POA: Diagnosis not present

## 2022-07-03 ENCOUNTER — Ambulatory Visit (INDEPENDENT_AMBULATORY_CARE_PROVIDER_SITE_OTHER): Payer: Medicaid Other | Admitting: Nurse Practitioner

## 2022-07-03 ENCOUNTER — Encounter: Payer: Self-pay | Admitting: Nurse Practitioner

## 2022-07-03 VITALS — BP 123/68 | HR 86 | Temp 98.9°F | Ht 63.0 in | Wt 228.0 lb

## 2022-07-03 DIAGNOSIS — Z6841 Body Mass Index (BMI) 40.0 and over, adult: Secondary | ICD-10-CM | POA: Diagnosis not present

## 2022-07-03 DIAGNOSIS — E559 Vitamin D deficiency, unspecified: Secondary | ICD-10-CM | POA: Diagnosis not present

## 2022-07-03 NOTE — Progress Notes (Signed)
Office: [phone]  /  Fax: [phone]  WEIGHT SUMMARY AND BIOMETRICS  No data recorded Weight Gained Since Last Visit: 1lb   Vitals Temp: 98.9 F (37.2 C) BP: 123/68 Pulse Rate: 86 SpO2: 100 %   Anthropometric Measurements Height: 5\' 3"  (1.6 m) Weight: 228 lb (103.4 kg) BMI (Calculated): 40.4 Weight at Last Visit: 227lb Weight Gained Since Last Visit: 1lb Starting Weight: 231lb Total Weight Loss (lbs): 3 lb (1.361 kg) Waist Measurement : 45 inches   Body Composition  Body Fat %: 41.9 % Fat Mass (lbs): 95.8 lbs Muscle Mass (lbs): 126.2 lbs Total Body Water (lbs): 88.8 lbs Visceral Fat Rating : 10   Other Clinical Data Fasting: No Labs: No Today's Visit #: 4 Starting Date: 05/14/22     HPI  Chief Complaint: OBESITY  Aliany is here to discuss her progress with her obesity treatment plan. She is on the the Category 2 Plan and states she is following her eating plan approximately 0 % of the time. She states she is exercising 0 minutes 0 days per week.   Interval History:  Since last office visit she has gained 1 pound.  She has gotten off track due to stress and has been over eating/stress eating  She has episodes where she eats a lot or doesn't eat.  She is stressed at work.  She is barely eating meals, eating more carbs and very little protein.  She tends to eat a lot of chips.  She is meeting her therapist every other week.     Pharmacotherapy for weight loss: She is not currently taking medications  for medical weight loss.    Previous pharmacotherapy for medical weight loss:   Vyvanse for binge eating .  Stopped due to side effects.   Bariatric surgery:  Patient has not had bariatric surgery.    Vit D deficiency  She is not taking Vit D 50,000 IU.   States she is simply not good with taking pills.   Lab Results  Component Value Date   VD25OH 8.9 (L) 05/14/2022     PHYSICAL EXAM:  Blood pressure 123/68, pulse 86, temperature 98.9 F  (37.2 C), height 5\' 3"  (1.6 m), weight 228 lb (103.4 kg), last menstrual period 06/12/2022, SpO2 100 %. Body mass index is 40.39 kg/m.  General: She is overweight, cooperative, alert, well developed, and in no acute distress. PSYCH: Has normal mood, affect and thought process.   Extremities: No edema.  Neurologic: No gross sensory or motor deficits. No tremors or fasciculations noted.    DIAGNOSTIC DATA REVIEWED:  BMET    Component Value Date/Time   NA 138 05/14/2022 1210   K 4.2 05/14/2022 1210   CL 103 05/14/2022 1210   CO2 22 05/14/2022 1210   GLUCOSE 82 05/14/2022 1210   GLUCOSE 113 (H) 04/12/2022 0655   BUN 5 (L) 05/14/2022 1210   CREATININE 0.86 05/14/2022 1210   CREATININE 0.80 08/30/2020 0000   CALCIUM 9.0 05/14/2022 1210   GFRNONAA >60 04/12/2022 0655   GFRNONAA 97 10/22/2017 1129   GFRAA 112 10/22/2017 1129   Lab Results  Component Value Date   HGBA1C 5.3 05/14/2022   HGBA1C 5.5 09/24/2014   Lab Results  Component Value Date   INSULIN 12.2 05/14/2022   Lab Results  Component Value Date   TSH 0.631 05/14/2022   CBC    Component Value Date/Time   WBC 9.3 04/12/2022 0655   RBC 4.15 04/12/2022 0655   HGB 10.1 (L)  04/12/2022 0655   HCT 33.8 (L) 04/12/2022 0655   PLT 263 04/12/2022 0655   MCV 81.4 04/12/2022 0655   MCH 24.3 (L) 04/12/2022 0655   MCHC 29.9 (L) 04/12/2022 0655   RDW 14.7 04/12/2022 0655   Iron Studies    Component Value Date/Time   IRON 28 (L) 08/30/2020 0000   TIBC 316 08/30/2020 0000   FERRITIN 19 08/30/2020 0000   IRONPCTSAT 9 (L) 08/30/2020 0000   Lipid Panel     Component Value Date/Time   CHOL 147 05/14/2022 1210   TRIG 51 05/14/2022 1210   HDL 43 05/14/2022 1210   CHOLHDL 3.3 08/30/2020 0000   VLDL 16 05/13/2015 0614   LDLCALC 93 05/14/2022 1210   LDLCALC CANCELED 08/30/2020 0928   Hepatic Function Panel     Component Value Date/Time   PROT 7.4 05/14/2022 1210   ALBUMIN 4.0 05/14/2022 1210   AST 17 05/14/2022  1210   ALT 13 05/14/2022 1210   ALKPHOS 79 05/14/2022 1210   BILITOT 0.3 05/14/2022 1210   BILIDIR <0.1 02/11/2011 2355   IBILI NOT CALCULATED 02/11/2011 2355      Component Value Date/Time   TSH 0.631 05/14/2022 1210   Nutritional Lab Results  Component Value Date   VD25OH 8.9 (L) 05/14/2022     ASSESSMENT AND PLAN  TREATMENT PLAN FOR OBESITY:  Recommended Dietary Goals  Mazola is currently in the action stage of change. As such, her goal is to continue weight management plan. She has agreed to the Category 2 Plan.  Behavioral Intervention  We discussed the following Behavioral Modification Strategies today: increasing lean protein intake, decreasing simple carbohydrates , increasing vegetables, increasing lower glycemic fruits, increasing fiber rich foods, avoiding skipping meals, increasing water intake, continue to practice mindfulness when eating, and planning for success.  Additional resources provided today: NA  Recommended Physical Activity Goals  Arinn has been advised to work up to 150 minutes of moderate intensity aerobic activity a week and strengthening exercises 2-3 times per week for cardiovascular health, weight loss maintenance and preservation of muscle mass.   She has agreed to Think about ways to increase daily physical activity and overcoming barriers to exercise, Increase physical activity in their day and reduce sedentary time (increase NEAT)., and Work on scheduling and tracking physical activity.    Pharmacotherapy Patient is not a candidate for Phentermine or Qsymia due to taking Adderall XR.   Unable to take GLP-1 due to cost.  ASSOCIATED CONDITIONS ADDRESSED TODAY  Action/Plan  Vitamin D deficiency Discussed the importance to take medications as directed  Low Vitamin D level contributes to fatigue and are associated with obesity, breast, and colon cancer. She agrees to continue to take prescription Vitamin D @50 ,000 IU every week and  will follow-up for routine testing of Vitamin D, at least 2-3 times per year to avoid over-replacement.   Morbid obesity (HCC)  BMI 40.0-44.9, adult (HCC)       Goals: Increase protein and water intake.    Return in about 2 weeks (around 07/17/2022).Marland Kitchen She was informed of the importance of frequent follow up visits to maximize her success with intensive lifestyle modifications for her multiple health conditions.   ATTESTASTION STATEMENTS:  Reviewed by clinician on day of visit: allergies, medications, problem list, medical history, surgical history, family history, social history, and previous encounter notes.   Patient was late for her appointment today. Time spent on visit including pre-visit chart review and post-visit care and charting was 20  minutes.    Theodis Sato. Couper Juncaj FNP-C

## 2022-07-07 DIAGNOSIS — Z419 Encounter for procedure for purposes other than remedying health state, unspecified: Secondary | ICD-10-CM | POA: Diagnosis not present

## 2022-07-09 DIAGNOSIS — R0683 Snoring: Secondary | ICD-10-CM | POA: Diagnosis not present

## 2022-07-09 DIAGNOSIS — G4719 Other hypersomnia: Secondary | ICD-10-CM | POA: Diagnosis not present

## 2022-07-17 ENCOUNTER — Ambulatory Visit (INDEPENDENT_AMBULATORY_CARE_PROVIDER_SITE_OTHER): Payer: Medicaid Other | Admitting: Nurse Practitioner

## 2022-07-17 ENCOUNTER — Encounter: Payer: Self-pay | Admitting: Nurse Practitioner

## 2022-07-17 VITALS — BP 134/79 | HR 91 | Temp 98.9°F | Ht 63.0 in | Wt 229.0 lb

## 2022-07-17 DIAGNOSIS — E559 Vitamin D deficiency, unspecified: Secondary | ICD-10-CM | POA: Diagnosis not present

## 2022-07-17 DIAGNOSIS — Z6841 Body Mass Index (BMI) 40.0 and over, adult: Secondary | ICD-10-CM | POA: Diagnosis not present

## 2022-07-17 MED ORDER — VITAMIN D (ERGOCALCIFEROL) 1.25 MG (50000 UNIT) PO CAPS: 50000.0000 [IU] | ORAL_CAPSULE | ORAL | 0 refills | Status: DC

## 2022-07-17 NOTE — Progress Notes (Signed)
Office: [phone]  /  Fax: [phone]  WEIGHT SUMMARY AND BIOMETRICS  No data recorded Weight Gained Since Last Visit: 1lb   Vitals Temp: 98.9 F (37.2 C) BP: 134/79 Pulse Rate: 91 SpO2: 98 %   Anthropometric Measurements Height: 5\' 3"  (1.6 m) Weight: 229 lb (103.9 kg) BMI (Calculated): 40.58 Weight at Last Visit: 228lb Weight Gained Since Last Visit: 1lb Starting Weight: 231lb Total Weight Loss (lbs): 2 lb (0.907 kg)   Body Composition  Body Fat %: 40.8 % Fat Mass (lbs): 93.6 lbs Muscle Mass (lbs): 128.8 lbs Total Body Water (lbs): 85.6 lbs Visceral Fat Rating : 10   Other Clinical Data Fasting: No Labs: No Today's Visit #: 5 Starting Date: 05/14/22     HPI  Chief Complaint: OBESITY  Tasha Barrett is here to discuss her progress with her obesity treatment plan. She is on the the Category 2 Plan and states she is following her eating plan approximately 0 % of the time. She states she is exercising 0 minutes 0 days per week.   Interval History:  Since last office visit she has gained 1 pound.  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  She has a lot on her "plate" right now.  She will be finished with her associate degree in December. She is going to [place] soon for a week.  She struggles with dairy.  She is trying to come off all dairy.  Drinking water daily but not enough.  Rarely drinks sugary drinks.  She is frustrated today with her weight.     Pharmacotherapy for weight loss: She is not currently taking medications  for medical weight loss.    Previous pharmacotherapy for medical weight loss:  Vyvanse for binge eating . Stopped due to side effects.   Bariatric surgery:  Patient has not had bariatric surgery but is considering it. Has questions today.    Vit D deficiency  She is taking Vit D 50,000 IU weekly-recently started taking it.  Denies side effects.  Denies nausea, vomiting or muscle weakness.    Lab  Results  Component Value Date   VD25OH 8.9 (L) 05/14/2022     PHYSICAL EXAM:  Blood pressure 134/79, pulse 91, temperature 98.9 F (37.2 C), height 5\' 3"  (1.6 m), weight 229 lb (103.9 kg), last menstrual period 07/13/2022, SpO2 98 %. Body mass index is 40.57 kg/m.  General: She is overweight, cooperative, alert, well developed, and in no acute distress. PSYCH: Has normal mood, affect and thought process.   Extremities: No edema.  Neurologic: No gross sensory or motor deficits. No tremors or fasciculations noted.    DIAGNOSTIC DATA REVIEWED:  BMET    Component Value Date/Time   NA 138 05/14/2022 1210   K 4.2 05/14/2022 1210   CL 103 05/14/2022 1210   CO2 22 05/14/2022 1210   GLUCOSE 82 05/14/2022 1210   GLUCOSE 113 (H) 04/12/2022 0655   BUN 5 (L) 05/14/2022 1210   CREATININE 0.86 05/14/2022 1210   CREATININE 0.80 08/30/2020 0000   CALCIUM 9.0 05/14/2022 1210   GFRNONAA >60 04/12/2022 0655   GFRNONAA 97 10/22/2017 1129   GFRAA 112 10/22/2017 1129   Lab Results  Component Value Date   HGBA1C 5.3 05/14/2022   HGBA1C 5.5 09/24/2014   Lab Results  Component Value Date   INSULIN 12.2 05/14/2022   Lab Results  Component Value Date   TSH 0.631 05/14/2022   CBC  Component Value Date/Time   WBC 9.3 04/12/2022 0655   RBC 4.15 04/12/2022 0655   HGB 10.1 (L) 04/12/2022 0655   HCT 33.8 (L) 04/12/2022 0655   PLT 263 04/12/2022 0655   MCV 81.4 04/12/2022 0655   MCH 24.3 (L) 04/12/2022 0655   MCHC 29.9 (L) 04/12/2022 0655   RDW 14.7 04/12/2022 0655   Iron Studies    Component Value Date/Time   IRON 28 (L) 08/30/2020 0000   TIBC 316 08/30/2020 0000   FERRITIN 19 08/30/2020 0000   IRONPCTSAT 9 (L) 08/30/2020 0000   Lipid Panel     Component Value Date/Time   CHOL 147 05/14/2022 1210   TRIG 51 05/14/2022 1210   HDL 43 05/14/2022 1210   CHOLHDL 3.3 08/30/2020 0000   VLDL 16 05/13/2015 0614   LDLCALC 93 05/14/2022 1210   LDLCALC CANCELED 08/30/2020 0928    Hepatic Function Panel     Component Value Date/Time   PROT 7.4 05/14/2022 1210   ALBUMIN 4.0 05/14/2022 1210   AST 17 05/14/2022 1210   ALT 13 05/14/2022 1210   ALKPHOS 79 05/14/2022 1210   BILITOT 0.3 05/14/2022 1210   BILIDIR <0.1 02/11/2011 2355   IBILI NOT CALCULATED 02/11/2011 2355      Component Value Date/Time   TSH 0.631 05/14/2022 1210   Nutritional Lab Results  Component Value Date   VD25OH 8.9 (L) 05/14/2022     ASSESSMENT AND PLAN  TREATMENT PLAN FOR OBESITY:  Recommended Dietary Goals  Tasha Barrett is currently in the action stage of change. As such, her goal is to continue weight management plan. She has agreed to following a lower carbohydrate, vegetable and lean protein rich diet plan.  Behavioral Intervention  We discussed the following Behavioral Modification Strategies today: increasing lean protein intake, decreasing simple carbohydrates , increasing vegetables, increasing lower glycemic fruits, increasing fiber rich foods, avoiding skipping meals, increasing water intake, continue to practice mindfulness when eating, and planning for success.  Additional resources provided today: NA  Recommended Physical Activity Goals  Tasha Barrett has been advised to work up to 150 minutes of moderate intensity aerobic activity a week and strengthening exercises 2-3 times per week for cardiovascular health, weight loss maintenance and preservation of muscle mass.   She has agreed to Think about ways to increase daily physical activity and overcoming barriers to exercise, Increase physical activity in their day and reduce sedentary time (increase NEAT)., and Work on scheduling and tracking physical activity.    Pharmacotherapy We discussed various medication options to help [facility] with her weight loss efforts and we both agreed to consider her options.  Avoid Phentermine and Qsymia due to taking Adderall.  Unable to take GLP-1s due to cost.    ASSOCIATED CONDITIONS  ADDRESSED TODAY  Action/Plan  Vitamin D deficiency -     Vitamin D (Ergocalciferol); Take 1 capsule (50,000 Units total) by mouth every 7 (seven) days.  Dispense: 5 capsule; Refill: 0.  Side effects discussed  Encouraged her to take meds as directed and risks associated with low Vit D levels  Low Vitamin D level contributes to fatigue and are associated with obesity, breast, and colon cancer. She agrees to continue to take prescription Vitamin D @50 ,000 IU every week and will follow-up for routine testing of Vitamin D, at least 2-3 times per year to avoid over-replacement.    Morbid obesity (HCC)  BMI 40.0-44.9, adult [facility])      Patient is interested in bariatric surgery.  I have referred  her to [facility] surgery site and encouraged her to sign up for a seminar.     Return in about 4 weeks (around 08/14/2022).Marland Kitchen She was informed of the importance of frequent follow up visits to maximize her success with intensive lifestyle modifications for her multiple health conditions.   ATTESTASTION STATEMENTS:  Reviewed by clinician on day of visit: allergies, medications, problem list, medical history, surgical history, family history, social history, and previous encounter notes.     Theodis Sato. Syanna Remmert FNP-C

## 2022-07-26 DIAGNOSIS — G4719 Other hypersomnia: Secondary | ICD-10-CM | POA: Diagnosis not present

## 2022-07-26 DIAGNOSIS — R0683 Snoring: Secondary | ICD-10-CM | POA: Diagnosis not present

## 2022-07-27 DIAGNOSIS — F331 Major depressive disorder, recurrent, moderate: Secondary | ICD-10-CM | POA: Diagnosis not present

## 2022-07-27 DIAGNOSIS — F4312 Post-traumatic stress disorder, chronic: Secondary | ICD-10-CM | POA: Diagnosis not present

## 2022-07-27 DIAGNOSIS — F900 Attention-deficit hyperactivity disorder, predominantly inattentive type: Secondary | ICD-10-CM | POA: Diagnosis not present

## 2022-08-06 DIAGNOSIS — Z419 Encounter for procedure for purposes other than remedying health state, unspecified: Secondary | ICD-10-CM | POA: Diagnosis not present

## 2022-08-07 DIAGNOSIS — F4312 Post-traumatic stress disorder, chronic: Secondary | ICD-10-CM | POA: Diagnosis not present

## 2022-08-07 DIAGNOSIS — F909 Attention-deficit hyperactivity disorder, unspecified type: Secondary | ICD-10-CM | POA: Diagnosis not present

## 2022-08-08 ENCOUNTER — Ambulatory Visit (INDEPENDENT_AMBULATORY_CARE_PROVIDER_SITE_OTHER): Payer: Medicaid Other | Admitting: Medical-Surgical

## 2022-08-08 ENCOUNTER — Encounter: Payer: Self-pay | Admitting: Medical-Surgical

## 2022-08-08 DIAGNOSIS — Z111 Encounter for screening for respiratory tuberculosis: Secondary | ICD-10-CM | POA: Diagnosis not present

## 2022-08-08 NOTE — Progress Notes (Signed)
   Established Patient Office Visit  Subjective   Patient ID: Tasha Barrett, female    DOB: Apr 24, 1990  Age: 32 y.o. MRN: [id number]  Chief Complaint  Patient presents with   PPD Placement    Nurse visit for PPD placement.    HPI  Patient is here for PPD placement. Denies any allergies or problems with previous PPD's placed in past.   ROS    Objective:     LMP 07/13/2022 (Approximate)    Physical Exam   No results found for any visits on 08/08/22.    The ASCVD Risk score (Arnett DK, et al., 2019) failed to calculate for the following reasons:   The 2019 ASCVD risk score is only valid for ages 80 to 64    Assessment & Plan:  Admin 0.6ml Tubersol ID left forearm. Small bleb in place as expected.  Patient tolerated injection well without complication . Instructed patient to return on Friday 05/11/22 for PPD read.  Problem List Items Addressed This Visit   None Visit Diagnoses     Screening-pulmonary TB    -  Primary   Relevant Orders   PPD (Completed)       Return for return in 48-72 hours for PPD read .    Elizabeth Palau, LPN

## 2022-08-08 NOTE — Progress Notes (Signed)
Medical screening examination/treatment was performed by qualified clinical staff member and as supervising provider I was immediately available for consultation/collaboration. I have reviewed documentation and agree with assessment and plan. ° °Xzaviar Maloof L. Luvern Mischke, DNP, APRN, FNP-BC °[facility] °Primary Care and Sports Medicine ° °

## 2022-08-10 ENCOUNTER — Ambulatory Visit (INDEPENDENT_AMBULATORY_CARE_PROVIDER_SITE_OTHER): Payer: Medicaid Other | Admitting: Medical-Surgical

## 2022-08-10 ENCOUNTER — Ambulatory Visit: Payer: Medicaid Other

## 2022-08-10 VITALS — Ht 63.0 in | Wt 229.0 lb

## 2022-08-10 DIAGNOSIS — Z111 Encounter for screening for respiratory tuberculosis: Secondary | ICD-10-CM | POA: Diagnosis not present

## 2022-08-10 LAB — TB SKIN TEST
Induration: 0 mm
TB Skin Test: NEGATIVE

## 2022-08-10 NOTE — Progress Notes (Signed)
Medical screening examination/treatment was performed by qualified clinical staff member and as supervising provider I was immediately available for consultation/collaboration. I have reviewed documentation and agree with assessment and plan. ° °Tsering Leaman L. Kendarius Vigen, DNP, APRN, FNP-BC °[facility] °Primary Care and Sports Medicine ° °

## 2022-08-10 NOTE — Progress Notes (Signed)
Pt presents for reading of PPD. Negative/36mm induration.

## 2022-08-16 ENCOUNTER — Ambulatory Visit (INDEPENDENT_AMBULATORY_CARE_PROVIDER_SITE_OTHER): Payer: Medicaid Other | Admitting: Medical-Surgical

## 2022-08-16 ENCOUNTER — Encounter: Payer: Self-pay | Admitting: Medical-Surgical

## 2022-08-16 ENCOUNTER — Ambulatory Visit: Payer: Medicaid Other | Admitting: Nurse Practitioner

## 2022-08-16 VITALS — BP 148/79 | HR 110 | Temp 97.7°F | Ht 63.0 in | Wt 239.0 lb

## 2022-08-16 DIAGNOSIS — Z7689 Persons encountering health services in other specified circumstances: Secondary | ICD-10-CM

## 2022-08-16 DIAGNOSIS — Z113 Encounter for screening for infections with a predominantly sexual mode of transmission: Secondary | ICD-10-CM | POA: Diagnosis not present

## 2022-08-16 DIAGNOSIS — B9689 Other specified bacterial agents as the cause of diseases classified elsewhere: Secondary | ICD-10-CM

## 2022-08-16 DIAGNOSIS — N760 Acute vaginitis: Secondary | ICD-10-CM

## 2022-08-16 MED ORDER — METRONIDAZOLE 500 MG PO TABS: 500.0000 mg | ORAL_TABLET | Freq: Two times a day (BID) | ORAL | 0 refills | Status: AC

## 2022-08-16 NOTE — Progress Notes (Signed)
        Established patient visit  History, exam, impression, and plan:  1. Bacterial vaginosis Pleasant 32 year old female presenting today with a history of recurrent bacterial vaginosis.  She was last treated in April 2024 however reports that the medication did not fully clear her symptoms.  She has been managing at home with intermittent use of boric acid suppositories twice daily however reports that she is very inconsistent with this.  Today she reports vaginal odor, itching, irritation, and discharge.  Sending wet prep for evaluation for yeast/bacterial vaginosis. - WET PREP FOR TRICH, YEAST, CLUE  2. Routine screening for STI (sexually transmitted infection) She is sexually active with 1 female partner and is requesting STI testing.  Checking labs as below. - WET PREP FOR TRICH, YEAST, CLUE - C. trachomatis/N. gonorrhoeae RNA - RPR - Hepatitis B surface antigen - Hepatitis C antibody - HIV Antibody (routine testing w rflx)  3. Encounter for weight management Has been struggling with weight management and was referred to healthy weight and wellness.  Did see some weight loss however has recently gained it back.  Admits that she is very emotional eater and when she gets stressed, she automatically reaches her food.  Having difficulty following the recommendations with healthy weight and wellness as she likes to do things her only rather than follow a strict guideline.  Interested in working with me regarding her weight.  Briefly discussed recommendations for nutritional intake as well as exercise.  We will plan to follow-up in about 1 month to see how she is doing and make recommendations from there.  Procedures performed this visit: None.  Return in about 1 month (around 09/16/2022) for weight check.  __________________________________ Thayer Ohm, DNP, APRN, FNP-BC Primary Care and Sports Medicine [facility]

## 2022-08-16 NOTE — Patient Instructions (Signed)
Reverse dieting Macros- aim for Protein 100g per day Exercise- cardio and strength, start small and work your way up

## 2022-08-17 DIAGNOSIS — N898 Other specified noninflammatory disorders of vagina: Secondary | ICD-10-CM | POA: Diagnosis not present

## 2022-08-17 LAB — HEPATITIS C ANTIBODY: Hepatitis C Ab: NONREACTIVE

## 2022-08-17 LAB — WET PREP FOR TRICH, YEAST, CLUE
MICRO NUMBER:: 15188005
Specimen Quality: ADEQUATE

## 2022-08-17 LAB — HIV ANTIBODY (ROUTINE TESTING W REFLEX): HIV 1&2 Ab, 4th Generation: NONREACTIVE

## 2022-08-17 LAB — HEPATITIS B SURFACE ANTIGEN: Hepatitis B Surface Ag: NONREACTIVE

## 2022-08-17 LAB — RPR: RPR Ser Ql: NONREACTIVE

## 2022-08-18 LAB — C. TRACHOMATIS/N. GONORRHOEAE RNA
C. trachomatis RNA, TMA: NOT DETECTED
N. gonorrhoeae RNA, TMA: NOT DETECTED

## 2022-09-04 ENCOUNTER — Other Ambulatory Visit: Payer: Self-pay | Admitting: Interventional Radiology

## 2022-09-04 DIAGNOSIS — F909 Attention-deficit hyperactivity disorder, unspecified type: Secondary | ICD-10-CM | POA: Diagnosis not present

## 2022-09-04 DIAGNOSIS — D259 Leiomyoma of uterus, unspecified: Secondary | ICD-10-CM

## 2022-09-04 DIAGNOSIS — R102 Pelvic and perineal pain: Secondary | ICD-10-CM

## 2022-09-04 DIAGNOSIS — F4312 Post-traumatic stress disorder, chronic: Secondary | ICD-10-CM | POA: Diagnosis not present

## 2022-09-06 DIAGNOSIS — Z419 Encounter for procedure for purposes other than remedying health state, unspecified: Secondary | ICD-10-CM | POA: Diagnosis not present

## 2022-09-13 ENCOUNTER — Ambulatory Visit: Payer: Medicaid Other | Admitting: Medical-Surgical

## 2022-09-20 DIAGNOSIS — F909 Attention-deficit hyperactivity disorder, unspecified type: Secondary | ICD-10-CM | POA: Diagnosis not present

## 2022-09-20 DIAGNOSIS — F4312 Post-traumatic stress disorder, chronic: Secondary | ICD-10-CM | POA: Diagnosis not present

## 2022-10-02 ENCOUNTER — Ambulatory Visit: Payer: Medicaid Other | Admitting: Medical-Surgical

## 2022-10-02 ENCOUNTER — Encounter: Payer: Self-pay | Admitting: Medical-Surgical

## 2022-10-02 VITALS — BP 122/79 | HR 89 | Ht 63.0 in | Wt 235.0 lb

## 2022-10-02 DIAGNOSIS — F603 Borderline personality disorder: Secondary | ICD-10-CM | POA: Diagnosis not present

## 2022-10-02 DIAGNOSIS — F4310 Post-traumatic stress disorder, unspecified: Secondary | ICD-10-CM | POA: Diagnosis not present

## 2022-10-02 DIAGNOSIS — B9689 Other specified bacterial agents as the cause of diseases classified elsewhere: Secondary | ICD-10-CM

## 2022-10-02 DIAGNOSIS — F902 Attention-deficit hyperactivity disorder, combined type: Secondary | ICD-10-CM

## 2022-10-02 DIAGNOSIS — F332 Major depressive disorder, recurrent severe without psychotic features: Secondary | ICD-10-CM

## 2022-10-02 DIAGNOSIS — R8290 Unspecified abnormal findings in urine: Secondary | ICD-10-CM

## 2022-10-02 DIAGNOSIS — N760 Acute vaginitis: Secondary | ICD-10-CM | POA: Diagnosis not present

## 2022-10-02 LAB — POCT URINALYSIS DIP (CLINITEK)
Bilirubin, UA: NEGATIVE
Glucose, UA: NEGATIVE mg/dL
Ketones, POC UA: NEGATIVE mg/dL
Nitrite, UA: POSITIVE — AB
POC PROTEIN,UA: NEGATIVE
Spec Grav, UA: 1.025 (ref 1.010–1.025)
Urobilinogen, UA: 0.2 E.U./dL
pH, UA: 5.5 (ref 5.0–8.0)

## 2022-10-02 MED ORDER — LISDEXAMFETAMINE DIMESYLATE 40 MG PO CAPS: 40.0000 mg | ORAL_CAPSULE | ORAL | 0 refills | Status: DC

## 2022-10-02 MED ORDER — VENLAFAXINE HCL ER 75 MG PO CP24: 75.0000 mg | ORAL_CAPSULE | Freq: Every day | ORAL | 3 refills | Status: AC

## 2022-10-02 MED ORDER — METRONIDAZOLE 0.75 % EX GEL: CUTANEOUS | 3 refills | Status: DC

## 2022-10-02 NOTE — Progress Notes (Signed)
        Established patient visit  History, exam, impression, and plan:  1. Abnormal urine odor Pleasant 32 year old female resenting today with reports of vaginal irritation/itching, urinary frequency, and urinary urgency.  History of recurrent bacterial vaginosis and is not currently doing the suppressive therapy although she has been using boric acid suppositories.  No fevers, chills, nausea, vomiting, hematuria, or flank pain.  POCT urinalysis positive for large amount of blood however she is on her menstrual cycle.  Urinalysis also positive for nitrates and trace leukocytes.  Sending for culture.  She would like to use over-the-counter Azo Standard with antibacterial to see if that is helpful prior to starting antibiotics.  We will wait on culture results to determine need for further treatment. - POCT URINALYSIS DIP (CLINITEK) - Urine Culture  2. Attention deficit hyperactivity disorder (ADHD), combined type Previously took Vyvanse and had difficulty sleeping.  Has recently tried Adderall XR but this also produced the same results.  Currently taking Adderall 10 mg instant release in the morning although she is only prescribed 5 mg twice daily.  Reports that she has increased irritability and rage using this medication and it does not help her focus.  She was previously seeing psychiatry who is managing this but reports that she does not want to go back to her psychiatric provider.  States "I do not like him but I do not not like him and I just do not want to go back".  Did advise her that like an manage she has medications, her treatment history has been complicated and finding a good fit for her may take patience.  Reviewed other options for treating ADHD that are instant release rather than extended.  Discussed Focalin, Metadate CD, and Ritalin.  She was looking these up during our appointment and decided that she wanted to go back on Vyvanse.  Discontinue Adderall.  Restarting Vyvanse 40 mg  daily.  3. Severe episode of recurrent major depressive disorder, without psychotic features (HCC) 4. Borderline personality disorder (HCC) 5. PTSD (post-traumatic stress disorder) As noted above, she does not want to see her psychiatrist anymore for medication management.  She plans to continue seeing her counselor on a regular basis.  Currently taking Effexor 75 mg daily, tolerating well without side effects.  Feels that the medication is working fairly well.  Some concern for manic behavior today however no evidence of psychosis.  Continue Effexor 75 mg daily.  Refill sent to pharmacy.  6. Bacterial vaginosis We have been working to find an adequate treatment for bacterial vaginosis for quite a while as this has been a chronic issue.  She has been evaluated at OB/GYN as well as primary care and tested positive several times.  She has been treated with Flagyl and most recently has been using boric acid suppositories.  At her last visit with me, we prescribed metronidazole gel twice weekly for 4 to 6 months as suppressive therapy.  Notes that she did not get this from the pharmacy as it was not ready with the rest of her medications.  Has continue using boric acid suppositories.  Reviewed the importance of working on suppressive therapy.  Resending metronidazole gel for use as noted above.   Procedures performed this visit: None.  Return in about 4 weeks (around 10/30/2022) for ADHD/mood follow up.  __________________________________ Thayer Ohm, DNP, APRN, FNP-BC Primary Care and Sports Medicine [facility]

## 2022-10-04 DIAGNOSIS — F909 Attention-deficit hyperactivity disorder, unspecified type: Secondary | ICD-10-CM | POA: Diagnosis not present

## 2022-10-04 DIAGNOSIS — F4312 Post-traumatic stress disorder, chronic: Secondary | ICD-10-CM | POA: Diagnosis not present

## 2022-10-05 LAB — URINE CULTURE

## 2022-10-05 MED ORDER — SULFAMETHOXAZOLE-TRIMETHOPRIM 800-160 MG PO TABS: 1.0000 | ORAL_TABLET | Freq: Two times a day (BID) | ORAL | 0 refills | Status: DC

## 2022-10-05 NOTE — Addendum Note (Signed)
Addended byChristen Butter on: 10/05/2022 12:27 PM   Modules accepted: Orders

## 2022-10-07 DIAGNOSIS — Z419 Encounter for procedure for purposes other than remedying health state, unspecified: Secondary | ICD-10-CM | POA: Diagnosis not present

## 2022-10-11 DIAGNOSIS — F909 Attention-deficit hyperactivity disorder, unspecified type: Secondary | ICD-10-CM | POA: Diagnosis not present

## 2022-10-11 DIAGNOSIS — F4312 Post-traumatic stress disorder, chronic: Secondary | ICD-10-CM | POA: Diagnosis not present

## 2022-10-12 ENCOUNTER — Encounter: Payer: Self-pay | Admitting: Medical-Surgical

## 2022-10-15 ENCOUNTER — Telehealth: Payer: Self-pay

## 2022-10-15 NOTE — Telephone Encounter (Addendum)
Initiated Prior authorization [id number] Dimesylate 40MG  capsules Via: Covermymeds Case/Key:[id number]  Status: approved as of 10/15/22 Reason:Authorization Expiration Date: 02/05/2023  Notified Pt via: Mychart

## 2022-10-18 DIAGNOSIS — F909 Attention-deficit hyperactivity disorder, unspecified type: Secondary | ICD-10-CM | POA: Diagnosis not present

## 2022-10-18 DIAGNOSIS — F4312 Post-traumatic stress disorder, chronic: Secondary | ICD-10-CM | POA: Diagnosis not present

## 2022-10-24 ENCOUNTER — Encounter: Payer: Self-pay | Admitting: Medical-Surgical

## 2022-10-25 DIAGNOSIS — F4312 Post-traumatic stress disorder, chronic: Secondary | ICD-10-CM | POA: Diagnosis not present

## 2022-10-25 DIAGNOSIS — F909 Attention-deficit hyperactivity disorder, unspecified type: Secondary | ICD-10-CM | POA: Diagnosis not present

## 2022-10-25 MED ORDER — LISDEXAMFETAMINE DIMESYLATE 40 MG PO CAPS: 40.0000 mg | ORAL_CAPSULE | ORAL | 0 refills | Status: DC

## 2022-10-30 ENCOUNTER — Telehealth: Payer: Medicaid Other | Admitting: Physician Assistant

## 2022-10-30 ENCOUNTER — Encounter: Payer: Self-pay | Admitting: Physician Assistant

## 2022-10-30 VITALS — Ht 63.0 in | Wt 230.0 lb

## 2022-10-30 DIAGNOSIS — J040 Acute laryngitis: Secondary | ICD-10-CM | POA: Diagnosis not present

## 2022-10-30 MED ORDER — PREDNISONE 20 MG PO TABS: 20.0000 mg | ORAL_TABLET | Freq: Two times a day (BID) | ORAL | 0 refills | Status: DC

## 2022-10-30 NOTE — Progress Notes (Unsigned)
Loss of voice onset for 1 week,  sore throat , pt has been drink tea, pt reports horsiness

## 2022-10-30 NOTE — Patient Instructions (Signed)
Laryngitis  Laryngitis is inflammation of the vocal cords that causes symptoms such as hoarseness or loss of voice. The vocal cords are two bands of muscles in your throat. When you speak, these cords come together and vibrate. The vibrations come out through your mouth as sound. When your vocal cords are inflamed, your voice sounds different. Laryngitis can be temporary (acute) or long-term (chronic). Most cases of acute laryngitis improve with time. Chronic laryngitis is laryngitis that lasts for more than 3 weeks. What are the causes? Acute laryngitis may be caused by: A viral infection. Lots of talking, yelling, or singing. This is also called vocal strain. A bacterial infection. Chronic laryngitis may be caused by: Vocal strain or an injury to the vocal cords. Acid reflux (gastroesophageal reflux disease, or GERD). Allergies, a sinus infection, or postnasal drip. Smoking. Excessive alcohol use. Breathing in chemicals or dust. Growths on the vocal cords. What increases the risk? The following factors may make you more likely to develop this condition: Smoking. Alcohol abuse. Having allergies. Chronic irritants in the workplace, such as toxic fumes. What are the signs or symptoms? Symptoms of this condition may include: Low, hoarse voice. Loss of voice. Dry cough. Sore or dry throat. Stuffy or congested nose. How is this diagnosed? This condition may be diagnosed based on: Your symptoms and a physical exam. Throat culture. Blood test. A procedure in which your health care provider looks at your vocal cords with a mirror or viewing tube (laryngoscopy). How is this treated? Treatment for laryngitis depends on what is causing it. Usually, treatment involves resting your voice and using medicines to soothe your throat. If your laryngitis is caused by a bacterial infection, you may need to take antibiotic medicine. If your laryngitis is caused by a growth, you may need to have a  procedure to remove it. Follow these instructions at home: Medicines Take over-the-counter and prescription medicines only as told by your health care provider. If you were prescribed an antibiotic medicine, take it as told by your health care provider. Do not stop taking the antibiotic even if you start to feel better. Use throat lozenges or sprays to soothe your throat as told by your health care provider. General instructions  Talk as little as possible. To do this: Write instead of talking. Do this until your voice is back to normal. Avoid whispering, which can cause vocal strain. Gargle with a mixture of salt and water 3-4 times a day or as needed. To make salt water, completely dissolve -1 tsp (3-6 g) of salt in 1 cup (237 mL) of warm water. Drink enough fluid to keep your urine pale yellow. Breathe in moist air. Use a humidifier if you live in a dry climate. Do not use any products that contain nicotine or tobacco. These products include cigarettes, chewing tobacco, and vaping devices, such as e-cigarettes. If you need help quitting, ask your health care provider. Contact a health care provider if: You have a fever. You have increasing pain. Your symptoms do not get better in 2 weeks. Get help right away if: You cough up blood. You have difficulty swallowing. You have trouble breathing. Summary Laryngitis is inflammation of the vocal cords that causes symptoms such as hoarseness or loss of voice. Laryngitis can be temporary or long-term. Treatment for laryngitis depends on the cause. It often involves resting your voice and using medicine to soothe your throat. Get help right away if you have difficulty swallowing or breathing or if you cough  up blood. This information is not intended to replace advice given to you by your health care provider. Make sure you discuss any questions you have with your health care provider. Document Revised: 04/11/2020 Document Reviewed:  04/11/2020 Elsevier Patient Education  2024 [employer].
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
## 2022-11-02 DIAGNOSIS — F909 Attention-deficit hyperactivity disorder, unspecified type: Secondary | ICD-10-CM | POA: Diagnosis not present

## 2022-11-02 DIAGNOSIS — F4312 Post-traumatic stress disorder, chronic: Secondary | ICD-10-CM | POA: Diagnosis not present

## 2022-11-05 ENCOUNTER — Ambulatory Visit
Admission: EM | Admit: 2022-11-05 | Discharge: 2022-11-05 | Disposition: A | Discharge status: Home / Self Care | Payer: Medicaid Other | Attending: Internal Medicine | Admitting: Internal Medicine

## 2022-11-05 ENCOUNTER — Ambulatory Visit (INDEPENDENT_AMBULATORY_CARE_PROVIDER_SITE_OTHER): Payer: Medicaid Other

## 2022-11-05 DIAGNOSIS — S99911A Unspecified injury of right ankle, initial encounter: Secondary | ICD-10-CM | POA: Diagnosis not present

## 2022-11-05 DIAGNOSIS — S8001XA Contusion of right knee, initial encounter: Secondary | ICD-10-CM | POA: Diagnosis not present

## 2022-11-05 DIAGNOSIS — S60221A Contusion of right hand, initial encounter: Secondary | ICD-10-CM

## 2022-11-05 DIAGNOSIS — M79641 Pain in right hand: Secondary | ICD-10-CM | POA: Diagnosis not present

## 2022-11-05 DIAGNOSIS — T07XXXA Unspecified multiple injuries, initial encounter: Secondary | ICD-10-CM

## 2022-11-05 DIAGNOSIS — W19XXXA Unspecified fall, initial encounter: Secondary | ICD-10-CM | POA: Diagnosis not present
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
## 2022-11-05 NOTE — ED Triage Notes (Signed)
Pt presents with c/o rt knee and ankle pain. States she hit her knee on a brick wall after falling and hit her knuckles against the brick wall as well.   States she fell down the stairs earlier around 1200.

## 2022-11-05 NOTE — Discharge Instructions (Addendum)
The x-ray reading we discussed is preliminary. Your x-ray will be read by a radiologist in next few hours. If there is a discrepancy, you will be contacted, and instructed on a new plan for you care.  Over-the-counter medications for pain such as 2 extra strength Tylenol every 6 hours or 3 ibuprofen every 6 hours, take with food.

## 2022-11-06 DIAGNOSIS — Z419 Encounter for procedure for purposes other than remedying health state, unspecified: Secondary | ICD-10-CM | POA: Diagnosis not present

## 2022-11-07 ENCOUNTER — Encounter: Payer: Self-pay | Admitting: Nurse Practitioner

## 2022-11-07 ENCOUNTER — Telehealth: Payer: Medicaid Other | Admitting: Nurse Practitioner

## 2022-11-07 ENCOUNTER — Telehealth: Payer: Medicaid Other | Admitting: Physician Assistant

## 2022-11-07 DIAGNOSIS — R6889 Other general symptoms and signs: Secondary | ICD-10-CM

## 2022-11-07 DIAGNOSIS — J069 Acute upper respiratory infection, unspecified: Secondary | ICD-10-CM | POA: Diagnosis not present

## 2022-11-07 DIAGNOSIS — J029 Acute pharyngitis, unspecified: Secondary | ICD-10-CM

## 2022-11-07 NOTE — Progress Notes (Signed)
Because of severe sore throat and now abrupt worsening of symptoms and need to assess for strep versus flu like illness, I feel your condition warrants further evaluation and I recommend that you be seen in a face to face visit. This way you can get an examination and testing so proper treatments are given.   NOTE: There will be NO CHARGE for this eVisit   If you are having a true medical emergency please call 911.      For an urgent face to face visit, [facility] has eight urgent care centers for your convenience:   NEW!! [facility] Center at [facility] Get Driving Directions [phone] [address] [place], [address]    [facility] Center at [facility] Get Driving Directions [phone] [address] [place], [place] [address]   [facility] Center [facility]) Get Driving Directions [phone] [address] [place], [place] [address]  [facility] Health Urgent Care Center [facility]) Get Driving Directions [phone] [address] [place],  [place]  [address]  [facility] Health Urgent Care Center [facility] - at [employer]  [phone] [address] [place],  [place] [address]   [facility] Health Urgent Care at [facility] Get Driving Directions [phone] [address] [place], [place] [address]   [facility] Health Urgent Care at [facility] Get Driving Directions  [phone] [address] [place], [place] [address]   [facility] Health Urgent Care at [facility] Directions [phone] [address], [address] [place], [place] [address]  Your MyChart E-visit questionnaire answers were reviewed by a board certified advanced clinical practitioner to complete your personal care plan based on your specific symptoms.  Thank you for using e-Visits.
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
## 2022-11-19 DIAGNOSIS — F4312 Post-traumatic stress disorder, chronic: Secondary | ICD-10-CM | POA: Diagnosis not present

## 2022-11-19 DIAGNOSIS — F909 Attention-deficit hyperactivity disorder, unspecified type: Secondary | ICD-10-CM | POA: Diagnosis not present

## 2022-12-07 DIAGNOSIS — Z419 Encounter for procedure for purposes other than remedying health state, unspecified: Secondary | ICD-10-CM | POA: Diagnosis not present

## 2022-12-13 DIAGNOSIS — F4312 Post-traumatic stress disorder, chronic: Secondary | ICD-10-CM | POA: Diagnosis not present

## 2022-12-13 DIAGNOSIS — F909 Attention-deficit hyperactivity disorder, unspecified type: Secondary | ICD-10-CM | POA: Diagnosis not present

## 2022-12-27 DIAGNOSIS — F4312 Post-traumatic stress disorder, chronic: Secondary | ICD-10-CM | POA: Diagnosis not present

## 2022-12-27 DIAGNOSIS — F909 Attention-deficit hyperactivity disorder, unspecified type: Secondary | ICD-10-CM | POA: Diagnosis not present

## 2023-01-06 DIAGNOSIS — Z419 Encounter for procedure for purposes other than remedying health state, unspecified: Secondary | ICD-10-CM | POA: Diagnosis not present

## 2023-01-10 DIAGNOSIS — F4312 Post-traumatic stress disorder, chronic: Secondary | ICD-10-CM | POA: Diagnosis not present

## 2023-01-10 DIAGNOSIS — F909 Attention-deficit hyperactivity disorder, unspecified type: Secondary | ICD-10-CM | POA: Diagnosis not present

## 2023-01-24 DIAGNOSIS — F4312 Post-traumatic stress disorder, chronic: Secondary | ICD-10-CM | POA: Diagnosis not present

## 2023-01-24 DIAGNOSIS — F909 Attention-deficit hyperactivity disorder, unspecified type: Secondary | ICD-10-CM | POA: Diagnosis not present

## 2023-02-04 DIAGNOSIS — F4312 Post-traumatic stress disorder, chronic: Secondary | ICD-10-CM | POA: Diagnosis not present

## 2023-02-04 DIAGNOSIS — F909 Attention-deficit hyperactivity disorder, unspecified type: Secondary | ICD-10-CM | POA: Diagnosis not present

## 2023-02-06 DIAGNOSIS — Z419 Encounter for procedure for purposes other than remedying health state, unspecified: Secondary | ICD-10-CM | POA: Diagnosis not present

## 2023-02-12 DIAGNOSIS — F4312 Post-traumatic stress disorder, chronic: Secondary | ICD-10-CM | POA: Diagnosis not present

## 2023-02-12 DIAGNOSIS — F909 Attention-deficit hyperactivity disorder, unspecified type: Secondary | ICD-10-CM | POA: Diagnosis not present

## 2023-02-19 DIAGNOSIS — F4312 Post-traumatic stress disorder, chronic: Secondary | ICD-10-CM | POA: Diagnosis not present

## 2023-02-19 DIAGNOSIS — F909 Attention-deficit hyperactivity disorder, unspecified type: Secondary | ICD-10-CM | POA: Diagnosis not present

## 2023-02-26 DIAGNOSIS — F909 Attention-deficit hyperactivity disorder, unspecified type: Secondary | ICD-10-CM | POA: Diagnosis not present

## 2023-02-26 DIAGNOSIS — F4312 Post-traumatic stress disorder, chronic: Secondary | ICD-10-CM | POA: Diagnosis not present

## 2023-03-01 ENCOUNTER — Encounter: Payer: Self-pay | Admitting: Medical-Surgical

## 2023-03-01 ENCOUNTER — Other Ambulatory Visit: Payer: Self-pay | Admitting: Medical-Surgical

## 2023-03-01 NOTE — Telephone Encounter (Signed)
Copied from CRM [id number]. Topic: Clinical - Medication Refill >> Mar 01, 2023  4:17 PM Geroge Baseman wrote: Most Recent Primary Care Visit:  Provider: Jomarie Longs  Department: [facility]  Visit Type: MYCHART VIDEO VISIT  Date: 10/30/2022  Medication: lisdexamfetamine (VYVANSE) 40 MG capsule  Has the patient contacted their pharmacy? Yes (Agent: If no, request that the patient contact the pharmacy for the refill. If patient does not wish to contact the pharmacy document the reason why and proceed with request.) (Agent: If yes, when and what did the pharmacy advise?)  Is this the correct pharmacy for this prescription? Yes If no, delete pharmacy and type the correct one.  This is the patient's preferred pharmacy:  [id number] - [place], [place] - [address] AT [facility] [address] [place] [address] Phone: [phone] Fax: [phone]     Has the prescription been filled recently? No  Is the patient out of the medication? Yes  Has the patient been seen for an appointment in the last year OR does the patient have an upcoming appointment? Yes, she scheduled herself on mychart  Can we respond through MyChart? Yes  Agent: Please be advised that Rx refills may take up to 3 business days. We ask that you follow-up with your pharmacy.

## 2023-03-01 NOTE — Telephone Encounter (Signed)
Last Fill: 10/25/22  Last OV: 10/02/22 Next OV: 03/08/23  Routing to provider for review/authorization.

## 2023-03-02 ENCOUNTER — Other Ambulatory Visit: Payer: Self-pay | Admitting: Medical-Surgical

## 2023-03-04 ENCOUNTER — Other Ambulatory Visit: Payer: Self-pay | Admitting: Medical-Surgical

## 2023-03-04 NOTE — Telephone Encounter (Unsigned)
Copied from CRM [id number]. Topic: Clinical - Medication Refill >> Mar 01, 2023  4:17 PM Tasha Barrett wrote: Most Recent Primary Care Visit:  Provider: Jomarie Barrett  Department: [facility]  Visit Type: MYCHART VIDEO VISIT  Date: 10/30/2022  Medication: lisdexamfetamine (VYVANSE) 40 MG capsule  Has the patient contacted their pharmacy? Yes (Agent: If no, request that the patient contact the pharmacy for the refill. If patient does not wish to contact the pharmacy document the reason why and proceed with request.) (Agent: If yes, when and what did the pharmacy advise?)  Is this the correct pharmacy for this prescription? Yes If no, delete pharmacy and type the correct one.  This is the patient's preferred pharmacy:  [id number] - [place], [place] - [address] AT [facility] [address] [place] [address] Phone: [phone] Fax: [phone]     Has the prescription been filled recently? No  Is the patient out of the medication? Yes  Has the patient been seen for an appointment in the last year OR does the patient have an upcoming appointment? Yes, she scheduled herself on mychart  Can we respond through MyChart? Yes  Agent: Please be advised that Rx refills may take up to 3 business days. We ask that you follow-up with your pharmacy.

## 2023-03-05 ENCOUNTER — Ambulatory Visit (INDEPENDENT_AMBULATORY_CARE_PROVIDER_SITE_OTHER): Payer: Medicaid Other | Admitting: Medical-Surgical

## 2023-03-05 ENCOUNTER — Encounter: Payer: Self-pay | Admitting: Medical-Surgical

## 2023-03-05 ENCOUNTER — Ambulatory Visit: Payer: Self-pay | Admitting: Medical-Surgical

## 2023-03-05 VITALS — BP 110/71 | HR 77 | Resp 20 | Ht 63.0 in | Wt 235.3 lb

## 2023-03-05 DIAGNOSIS — F4310 Post-traumatic stress disorder, unspecified: Secondary | ICD-10-CM

## 2023-03-05 DIAGNOSIS — F603 Borderline personality disorder: Secondary | ICD-10-CM | POA: Diagnosis not present

## 2023-03-05 DIAGNOSIS — F902 Attention-deficit hyperactivity disorder, combined type: Secondary | ICD-10-CM

## 2023-03-05 DIAGNOSIS — F332 Major depressive disorder, recurrent severe without psychotic features: Secondary | ICD-10-CM

## 2023-03-05 MED ORDER — GABAPENTIN 300 MG PO CAPS: 300.0000 mg | ORAL_CAPSULE | Freq: Every day | ORAL | 3 refills | Status: DC

## 2023-03-05 MED ORDER — LISDEXAMFETAMINE DIMESYLATE 50 MG PO CAPS: 50.0000 mg | ORAL_CAPSULE | Freq: Every day | ORAL | 0 refills | Status: DC

## 2023-03-05 MED ORDER — VENLAFAXINE HCL ER 37.5 MG PO CP24: 37.5000 mg | ORAL_CAPSULE | Freq: Every day | ORAL | 1 refills | Status: DC

## 2023-03-05 NOTE — Progress Notes (Unsigned)
        Established patient visit  History, exam, impression, and plan:  1. Attention deficit hyperactivity disorder (ADHD), combined type (Primary) Pleasant 33 year old female presenting today for follow-up on ADHD.  She was previously seeing psychiatry who was managing her medications however she is decided not to go back to them.  She was previously taking Vyvanse 40 mg daily but has run out of the medication and is extremely restless and agitated today.  States that she is angry with me but also understands the need for documentation and follow-up.  Feels that the 40 mg dose helps but could work a little bit better and is interested in going up to the 50 mg dose if this is available at pharmacies.  Has difficulty sleeping with using the medication however without the medication, she cannot manage on a day-to-day basis.  Discussed the need for documentation and regular follow-up.  After discussion, patient reports that she is no longer angry with me.  Vyvanse 50 mg sent to the pharmacy.  Pharmacy was called and it was verified that they had the medication on hand.  Will try this for a month then follow-up to see how things are going.  2. Borderline personality disorder (HCC) 3. PTSD (post-traumatic stress disorder) 4. Severe episode of recurrent major depressive disorder, without psychotic features (HCC) Has been taking Effexor 75 mg at bedtime, tolerating well without side effects.  Feels that the medication is working some but also could work little better.  As noted, she is agitated, restless with pressured speech.  After being able to vent her emotions during our appointment, she was able to calm down and speak clearly about her issues.  Denies SI/HI.  She is interested in trying to increase the venlafaxine a bit to see if it helps more with the anxiety, depression, and PTSD.  Hesitant to jump to 150 mg so we will add a 37.5 mg dose of Effexor to the 75 mg dose to equal 112.5 mg.  We will touch  base in about a month to see how things are going with the increased dose.   Procedures performed this visit: None.  Return in about 4 weeks (around 04/02/2023) for ADHD/mood follow up.  __________________________________ Thayer Ohm, DNP, APRN, FNP-BC Primary Care and Sports Medicine [facility]

## 2023-03-05 NOTE — Telephone Encounter (Signed)
Patient seen in office today.

## 2023-03-05 NOTE — Telephone Encounter (Signed)
Copied from CRM [id number]. Topic: Clinical - Red Word Triage >> Mar 05, 2023  1:18 PM Elle L wrote: [facility] that prompted transfer to Nurse Triage: The patient was denied her  lisdexamfetamine (VYVANSE) 40 MG capsule prescription until she is seen in office but is having withdrawals and is having a panic attack. Chief Complaint: panic attack Symptoms: Nausea, hot flashes, cold/chills, tired, overwhelmed, anxious crying, increased anxiety, can't function right now,  Frequency: unknown but worsening  Pertinent Negatives: Patient denies SOB Disposition: [] ED /[] Urgent Care (no appt availability in office) / [x] Appointment(In office/virtual)/ []  [facility]/ [] Home Care/ [] Refused Recommended Disposition /[] [facility]/ []  Follow-up with PCP Additional Notes: patient stated feels like I have taken a sedative - struggling to stay awake, constantly falling asleep before realizing, crying, anxious, overwhelmed, pt can not calm down  Reason for Disposition  Patient sounds very upset or troubled to the triager  Answer Assessment - Initial Assessment Questions 1. CONCERN: "Did anything happen that prompted you to call today?"      Panic attack 2. ANXIETY SYMPTOMS: "Can you describe how you (your loved one; patient) have been feeling?" (e.g., tense, restless, panicky, anxious, keyed up, overwhelmed, sense of impending doom).      tired, overwhelmed, anxious  3. ONSET: "How long have you been feeling this way?" (e.g., hours, days, weeks)     Unknown but worsening 4. SEVERITY: "How would you rate the level of anxiety?" (e.g., 0 - 10; or mild, moderate, severe).     severe 5. FUNCTIONAL IMPAIRMENT: "How have these feelings affected your ability to do daily activities?" "Have you had more difficulty than usual doing your normal daily activities?" (e.g., getting better, same, worse; self-care, school, work, interactions)     Unable to function - called out of work 6. HISTORY: "Have you  felt this way before?" "Have you ever been diagnosed with an anxiety problem in the past?" (e.g., generalized anxiety disorder, panic attacks, PTSD). If Yes, ask: "How was this problem treated?" (e.g., medicines, counseling, etc.)     MSS, PTSD, HCC 7. RISK OF HARM - SUICIDAL IDEATION: "Do you ever have thoughts of hurting or killing yourself?" If Yes, ask:  "Do you have these feelings now?" "Do you have a plan on how you would do this?"     no 8. TREATMENT:  "What has been done so far to treat this anxiety?" (e.g., medicines, relaxation strategies). "What has helped?"     (VYVANSE) 40 MG- denied for a while then was able to pick up 01/22/2023 - PCP denied renewal - pt needs to be seen in office 9. TREATMENT - THERAPIST: "Do you have a counselor or therapist? Name?"     therapist 10. POTENTIAL TRIGGERS: "Do you drink caffeinated beverages (e.g., coffee, colas, teas), and how much daily?" "Do you drink alcohol or use any drugs?" "Have you started any new medicines recently?"       Drinking energy drinks 11. PATIENT SUPPORT: "Who is with you now?" "Who do you live with?" "Do you have family or friends who you can talk to?"        Friends and family for support 12. OTHER SYMPTOMS: "Do you have any other symptoms?" (e.g., feeling depressed, trouble concentrating, trouble sleeping, trouble breathing, palpitations or fast heartbeat, chest pain, sweating, nausea, or diarrhea)       Nausea, hot flashes, cold/chills, feels like I have taken a sedative - struggling to stay awake, constantly falling asleep before realizing, crying,  increased anxiety, can't function right now,  13. PREGNANCY: "Is there any chance you are pregnant?" "When was your last menstrual period?"       N/a  Protocols used: Anxiety and Panic Attack-A-AH

## 2023-03-05 NOTE — Telephone Encounter (Signed)
Patient needs an appointment

## 2023-03-06 ENCOUNTER — Encounter: Payer: Self-pay | Admitting: Medical-Surgical

## 2023-03-07 ENCOUNTER — Telehealth: Payer: Self-pay

## 2023-03-07 NOTE — Telephone Encounter (Signed)
A prior authorization has been submitted for Vyvanse.   [employer] has not yet replied to your PA request. You may close this dialog, return to your dashboard, and perform other tasks.

## 2023-03-07 NOTE — Telephone Encounter (Signed)
Copied from CRM [id number]. Topic: Clinical - Prescription Issue >> Mar 05, 2023  3:18 PM Nila Nephew wrote: Reason for CRM: Patient is calling because she needs the name brand VYVANSE prescribed, as her pharmacy states they require a prior authorization. Patient is requesting this authorization be sent in today, approved, and filled by the pharmacy, before our clinic closes. Patient is also requesting we call the pharmacy to verify it has been filled before closing.   Patient is requesting a nurse call her to provide her an immediate timeline on when this will be completed.

## 2023-03-08 ENCOUNTER — Telehealth: Payer: Medicaid Other | Admitting: Medical-Surgical

## 2023-03-08 NOTE — Telephone Encounter (Signed)
Approved on January 30 by [facility] Medicaid 2017 Approved. This drug is covered on the Preferred Drug List. It does not require prior approval. Only specific National Drug Codes [facility]) are covered. [facility] your pharmacy is using is not covered. The pharmacy can use NDC(s): I5119789. You can get 90 capsules per 90 days. Since this request is within the QL, it does not require prior approval. You filled this medication on 03/05/2023 for a quantity of 30 for a 30 day supply at [id number]. Your next refill is on 03/27/2023. Authorization Expiration Date: 02/05/2024

## 2023-03-09 DIAGNOSIS — Z419 Encounter for procedure for purposes other than remedying health state, unspecified: Secondary | ICD-10-CM | POA: Diagnosis not present

## 2023-03-19 DIAGNOSIS — F4312 Post-traumatic stress disorder, chronic: Secondary | ICD-10-CM | POA: Diagnosis not present

## 2023-03-19 DIAGNOSIS — F909 Attention-deficit hyperactivity disorder, unspecified type: Secondary | ICD-10-CM | POA: Diagnosis not present

## 2023-03-27 ENCOUNTER — Other Ambulatory Visit: Payer: Self-pay | Admitting: Medical-Surgical

## 2023-04-04 ENCOUNTER — Encounter: Payer: Self-pay | Admitting: Medical-Surgical

## 2023-04-05 DIAGNOSIS — F4312 Post-traumatic stress disorder, chronic: Secondary | ICD-10-CM | POA: Diagnosis not present

## 2023-04-05 DIAGNOSIS — F909 Attention-deficit hyperactivity disorder, unspecified type: Secondary | ICD-10-CM | POA: Diagnosis not present

## 2023-04-06 DIAGNOSIS — Z419 Encounter for procedure for purposes other than remedying health state, unspecified: Secondary | ICD-10-CM | POA: Diagnosis not present

## 2023-04-10 DIAGNOSIS — F4312 Post-traumatic stress disorder, chronic: Secondary | ICD-10-CM | POA: Diagnosis not present

## 2023-04-10 DIAGNOSIS — F909 Attention-deficit hyperactivity disorder, unspecified type: Secondary | ICD-10-CM | POA: Diagnosis not present

## 2023-04-15 ENCOUNTER — Ambulatory Visit (INDEPENDENT_AMBULATORY_CARE_PROVIDER_SITE_OTHER)

## 2023-04-15 ENCOUNTER — Ambulatory Visit (INDEPENDENT_AMBULATORY_CARE_PROVIDER_SITE_OTHER): Admitting: Medical-Surgical

## 2023-04-15 ENCOUNTER — Encounter: Payer: Self-pay | Admitting: Medical-Surgical

## 2023-04-15 VITALS — BP 94/60 | HR 96 | Resp 20 | Ht 63.0 in | Wt 233.8 lb

## 2023-04-15 DIAGNOSIS — F332 Major depressive disorder, recurrent severe without psychotic features: Secondary | ICD-10-CM

## 2023-04-15 DIAGNOSIS — R531 Weakness: Secondary | ICD-10-CM

## 2023-04-15 DIAGNOSIS — M79641 Pain in right hand: Secondary | ICD-10-CM

## 2023-04-15 DIAGNOSIS — M79642 Pain in left hand: Secondary | ICD-10-CM | POA: Diagnosis not present

## 2023-04-15 DIAGNOSIS — M2550 Pain in unspecified joint: Secondary | ICD-10-CM

## 2023-04-15 DIAGNOSIS — F902 Attention-deficit hyperactivity disorder, combined type: Secondary | ICD-10-CM | POA: Diagnosis not present

## 2023-04-15 DIAGNOSIS — E559 Vitamin D deficiency, unspecified: Secondary | ICD-10-CM

## 2023-04-15 DIAGNOSIS — R569 Unspecified convulsions: Secondary | ICD-10-CM

## 2023-04-15 DIAGNOSIS — R200 Anesthesia of skin: Secondary | ICD-10-CM

## 2023-04-15 DIAGNOSIS — F4310 Post-traumatic stress disorder, unspecified: Secondary | ICD-10-CM

## 2023-04-15 DIAGNOSIS — R202 Paresthesia of skin: Secondary | ICD-10-CM | POA: Diagnosis not present

## 2023-04-15 DIAGNOSIS — F603 Borderline personality disorder: Secondary | ICD-10-CM | POA: Diagnosis not present

## 2023-04-15 DIAGNOSIS — R499 Unspecified voice and resonance disorder: Secondary | ICD-10-CM

## 2023-04-15 MED ORDER — VITAMIN D (ERGOCALCIFEROL) 1.25 MG (50000 UNIT) PO CAPS: 50000.0000 [IU] | ORAL_CAPSULE | ORAL | 0 refills | Status: DC

## 2023-04-15 MED ORDER — LISDEXAMFETAMINE DIMESYLATE 50 MG PO CAPS: 50.0000 mg | ORAL_CAPSULE | Freq: Every day | ORAL | 0 refills | Status: DC

## 2023-04-15 NOTE — Progress Notes (Signed)
        Established patient visit  History, exam, impression, and plan:  1. Borderline personality disorder (HCC) (Primary) 2. Severe episode of recurrent major depressive disorder, without psychotic features (HCC) 3. PTSD (post-traumatic stress disorder) Pleasant 33 year old female presenting today for follow-up on mood management.  She is currently taking Effexor at 75 mg daily, tolerating well without side effects.  Feels that this is at a good dose for her and would like to stay here.  Reports that the medication seems to be working well and is keeping her mood stable.  Denies SI/HI.  During appointment, mood, affect, thought pattern, cognition, and speech all normal.  Continue Effexor 75 mg daily as prescribed.  4. Attention deficit hyperactivity disorder (ADHD), combined type Currently taking Vyvanse 50 mg daily, tolerating well without side effects.  Feels that this dose is working well for her and is helping her focus well enhancing task completion.  Denies sleep disturbance, appetite suppression, weight fluctuations, palpitations, constipation, and worsened anxiety.  Continue Vyvanse 50 mg daily.  5. Vitamin D deficiency History of extremely low vitamin D with a reading of 8.9.  She was prescribed high-dose replacement for 8 weeks last year but reports that she never picked this up.  Rechecking vitamin D today.  Plan for 12 weeks of high-dose replacement followed by transition to over-the-counter vitamin D3 2000 units daily. - VITAMIN D 25 Hydroxy (Vit-D Deficiency, Fractures)  6. Seizure-like activity (HCC) No prior history of seizures however recently, she had an issue where she had seizure-like activity.  She had 3 panic attacks in 1 day approximately 2 weeks ago.  Shortly after the panic attacks, she was in [employer] and had an episode that felt almost like panic.  She was able to get herself to a seated position and notes that she can hear what was going on but she could not  interact.  She heard a lady that was next to her say that she looked like she was having a seizure.  She has not had any further seizure-like activity since then.  Has done some research into seizure-like activity and wonders if she may be developing PNES but would like further evaluation.  Referring to neurology. - Ambulatory referral to Neurology  7. Polyarthralgia Reports that she has widespread body aches and pains with the most prominent being in her bilateral hands.  Notes that her legs and feet hurt every morning on awakening.  She also has some bilateral upper extremity paresthesias and a history of chronic neck pain.  Unclear etiology.  Plan for x-rays of the cervical spine as well as the right and left hand.  Getting labs as noted below to evaluate for rheumatological cause.  Consider extremely low vitamin D as a contributor to her symptoms. - DG Cervical Spine Complete; Future - Sed Rate (ESR) - C-reactive protein - Rheumatoid Factor - Cyclic citrul peptide antibody, IgG - ANA - DG Hand Complete Right; Future - DG Hand Complete Left; Future - CYCLIC CITRUL PEPTIDE ANTIBODY, IGG/IGA   Procedures performed this visit: None.  Return in about 3 months (around 07/16/2023) for mood/ADHD follow up.  __________________________________ Thayer Ohm, DNP, APRN, FNP-BC Primary Care and Sports Medicine [facility] [place]

## 2023-04-16 ENCOUNTER — Encounter: Payer: Self-pay | Admitting: Medical-Surgical

## 2023-04-16 LAB — C-REACTIVE PROTEIN: CRP: 7 mg/L (ref 0–10)

## 2023-04-16 LAB — SEDIMENTATION RATE: Sed Rate: 44 mm/h — ABNORMAL HIGH (ref 0–32)

## 2023-04-16 LAB — ANA: Anti Nuclear Antibody (ANA): NEGATIVE

## 2023-04-16 LAB — VITAMIN D 25 HYDROXY (VIT D DEFICIENCY, FRACTURES): Vit D, 25-Hydroxy: 10.7 ng/mL — ABNORMAL LOW (ref 30.0–100.0)

## 2023-04-16 LAB — RHEUMATOID FACTOR: Rheumatoid fact SerPl-aCnc: <10 [IU]/mL (ref <14.0)

## 2023-04-18 MED ORDER — AMBULATORY NON FORMULARY MEDICATION: 0 refills | Status: AC

## 2023-04-18 MED ORDER — AMBULATORY NON FORMULARY MEDICATION: 0 refills | Status: DC

## 2023-04-20 ENCOUNTER — Encounter: Payer: Self-pay | Admitting: Medical-Surgical

## 2023-04-20 LAB — CYCLIC CITRUL PEPTIDE ANTIBODY, IGG/IGA: Cyclic Citrullin Peptide Ab: 7 U (ref 0–19)

## 2023-04-23 ENCOUNTER — Telehealth (INDEPENDENT_AMBULATORY_CARE_PROVIDER_SITE_OTHER): Admitting: Medical-Surgical

## 2023-04-23 ENCOUNTER — Ambulatory Visit: Admitting: Medical-Surgical

## 2023-04-23 ENCOUNTER — Encounter: Payer: Self-pay | Admitting: Medical-Surgical

## 2023-04-23 DIAGNOSIS — Z6841 Body Mass Index (BMI) 40.0 and over, adult: Secondary | ICD-10-CM

## 2023-04-23 NOTE — Progress Notes (Signed)
 Virtual Visit via Video Note  I connected with Tasha Barrett on 04/23/23 at  1:00 PM EDT by a video enabled telemedicine application and verified that I am speaking with the correct person using two identifiers.   I discussed the limitations of evaluation and management by telemedicine and the availability of in person appointments. The patient expressed understanding and agreed to proceed.  Patient location: home Provider locations: office  Subjective:    CC: weight loss, etc.  HPI: Pleasant 33 year old female presenting today via MyChart video visit to discuss her efforts at weight loss and mood management.  She currently has a BMI of 41.43 and has struggled with chronic mental health concerns including ADHD, anxiety, and some depression.  She joined the [employer] as an effort to promote weight loss.  Plans to participate in exercise classes and would like to utilize their options for personal trainers.  Working on dietary modifications.  Notes that she has not FSA account that she has been depositing money in and would like to be able to use that towards her gym membership.  To do this, she will need a letter stating this is a medical necessity and would be beneficial to her overall health.   Past medical history, Surgical history, Family history not pertinant except as noted below, Social history, Allergies, and medications have been entered into the medical record, reviewed, and corrections made.   Review of Systems: See HPI for pertinent positives and negatives.   Objective:    General: Speaking clearly in complete sentences without any shortness of breath.  Alert and oriented x3.  Normal judgment. No apparent acute distress.  Impression and Recommendations:    1. Morbid obesity (HCC) (Primary) She has trouble with morbid obesity and mental health for years without being able to get this under control.  She is on the right path with joining a gym and becoming more active.  Agree that  with her medical concerns, utilizing an FSA account for the gym membership is a medical necessity.  Letter will be written and sent via MyChart with a signed copy sent via mail.  I discussed the assessment and treatment plan with the patient. The patient was provided an opportunity to ask questions and all were answered. The patient agreed with the plan and demonstrated an understanding of the instructions.   The patient was advised to call back or seek an in-person evaluation if the symptoms worsen or if the condition fails to improve as anticipated.  Return if symptoms worsen or fail to improve.  Thayer Ohm, DNP, APRN, FNP-BC [facility] and Sports Medicine

## 2023-04-24 DIAGNOSIS — F4312 Post-traumatic stress disorder, chronic: Secondary | ICD-10-CM | POA: Diagnosis not present

## 2023-04-24 DIAGNOSIS — F909 Attention-deficit hyperactivity disorder, unspecified type: Secondary | ICD-10-CM | POA: Diagnosis not present

## 2023-04-25 ENCOUNTER — Telehealth: Admitting: Medical-Surgical

## 2023-05-02 ENCOUNTER — Telehealth: Admitting: Medical-Surgical

## 2023-05-03 DIAGNOSIS — F4312 Post-traumatic stress disorder, chronic: Secondary | ICD-10-CM | POA: Diagnosis not present

## 2023-05-03 DIAGNOSIS — F909 Attention-deficit hyperactivity disorder, unspecified type: Secondary | ICD-10-CM | POA: Diagnosis not present

## 2023-05-06 ENCOUNTER — Encounter: Payer: Self-pay | Admitting: Otolaryngology

## 2023-05-07 DIAGNOSIS — F4312 Post-traumatic stress disorder, chronic: Secondary | ICD-10-CM | POA: Diagnosis not present

## 2023-05-07 DIAGNOSIS — F909 Attention-deficit hyperactivity disorder, unspecified type: Secondary | ICD-10-CM | POA: Diagnosis not present

## 2023-05-12 ENCOUNTER — Encounter: Payer: Self-pay | Admitting: Medical-Surgical

## 2023-05-14 DIAGNOSIS — F4312 Post-traumatic stress disorder, chronic: Secondary | ICD-10-CM | POA: Diagnosis not present

## 2023-05-14 DIAGNOSIS — F909 Attention-deficit hyperactivity disorder, unspecified type: Secondary | ICD-10-CM | POA: Diagnosis not present

## 2023-05-18 DIAGNOSIS — Z419 Encounter for procedure for purposes other than remedying health state, unspecified: Secondary | ICD-10-CM | POA: Diagnosis not present

## 2023-05-25 ENCOUNTER — Other Ambulatory Visit: Payer: Self-pay | Admitting: Medical-Surgical

## 2023-05-29 DIAGNOSIS — F4312 Post-traumatic stress disorder, chronic: Secondary | ICD-10-CM | POA: Diagnosis not present

## 2023-05-29 DIAGNOSIS — F909 Attention-deficit hyperactivity disorder, unspecified type: Secondary | ICD-10-CM | POA: Diagnosis not present

## 2023-06-17 DIAGNOSIS — Z419 Encounter for procedure for purposes other than remedying health state, unspecified: Secondary | ICD-10-CM | POA: Diagnosis not present

## 2023-07-10 DIAGNOSIS — F4312 Post-traumatic stress disorder, chronic: Secondary | ICD-10-CM | POA: Diagnosis not present

## 2023-07-10 DIAGNOSIS — F909 Attention-deficit hyperactivity disorder, unspecified type: Secondary | ICD-10-CM | POA: Diagnosis not present

## 2023-07-18 DIAGNOSIS — Z419 Encounter for procedure for purposes other than remedying health state, unspecified: Secondary | ICD-10-CM | POA: Diagnosis not present

## 2023-08-06 DIAGNOSIS — F4312 Post-traumatic stress disorder, chronic: Secondary | ICD-10-CM | POA: Diagnosis not present

## 2023-08-06 DIAGNOSIS — F909 Attention-deficit hyperactivity disorder, unspecified type: Secondary | ICD-10-CM | POA: Diagnosis not present

## 2023-08-08 ENCOUNTER — Encounter: Payer: Self-pay | Admitting: Medical-Surgical

## 2023-08-08 ENCOUNTER — Ambulatory Visit (INDEPENDENT_AMBULATORY_CARE_PROVIDER_SITE_OTHER): Admitting: Medical-Surgical

## 2023-08-08 VITALS — BP 111/72 | HR 83 | Resp 20 | Ht 63.0 in | Wt 230.6 lb

## 2023-08-08 DIAGNOSIS — N898 Other specified noninflammatory disorders of vagina: Secondary | ICD-10-CM

## 2023-08-08 DIAGNOSIS — R499 Unspecified voice and resonance disorder: Secondary | ICD-10-CM

## 2023-08-08 DIAGNOSIS — Z1341 Encounter for autism screening: Secondary | ICD-10-CM

## 2023-08-08 DIAGNOSIS — Z0100 Encounter for examination of eyes and vision without abnormal findings: Secondary | ICD-10-CM | POA: Diagnosis not present

## 2023-08-08 DIAGNOSIS — F902 Attention-deficit hyperactivity disorder, combined type: Secondary | ICD-10-CM

## 2023-08-08 DIAGNOSIS — E559 Vitamin D deficiency, unspecified: Secondary | ICD-10-CM | POA: Diagnosis not present

## 2023-08-08 LAB — POCT URINALYSIS DIP (CLINITEK)
Bilirubin, UA: NEGATIVE
Blood, UA: NEGATIVE
Glucose, UA: NEGATIVE mg/dL
Ketones, POC UA: NEGATIVE mg/dL
Leukocytes, UA: NEGATIVE
Nitrite, UA: NEGATIVE
POC PROTEIN,UA: NEGATIVE
Spec Grav, UA: 1.02 (ref 1.010–1.025)
Urobilinogen, UA: 0.2 U/dL
pH, UA: 6.5 (ref 5.0–8.0)

## 2023-08-08 MED ORDER — LISDEXAMFETAMINE DIMESYLATE 30 MG PO CAPS: 30.0000 mg | ORAL_CAPSULE | Freq: Every day | ORAL | 0 refills | Status: DC

## 2023-08-08 NOTE — Progress Notes (Signed)
 Established patient visit  History, exam, impression, and plan:  1. Vaginal odor (Primary) Pleasant 33 year old female presenting today with reports of recurrent BV and difficulty completely eradicating it.  Today she reports that the vaginal odor has returned that she has frequently associated with BV.  Has a history of trialing metronidazole  topically however this caused further vaginal irritation.  Added to intolerance list today.  Notes that she often has to have the oral treatment.  Given the recurrent nature, she is interested in completing several rounds of therapy to fully eradicate the BV.  She is using boric acid suppositories on an as-needed basis and these are somewhat helpful but do not fully clear the infection.  POCT urinalysis normal.  Wet prep sent for further evaluation.  If positive for BV, we will need to look at treatment for recurrent issues in order to treat this appropriately. - POCT URINALYSIS DIP (CLINITEK) - WET PREP FOR TRICH, YEAST, CLUE  2. Vitamin D  deficiency History of vitamin D  deficiency and was previously prescribed high-dose vitamin D  replacement.  She is due for recheck so adding vitamin D  to labs today. - Vitamin D  (25 hydroxy)  3. Attention deficit hyperactivity disorder (ADHD), combined type She is now under the care of a new psychiatrist and reports that they were trying to increase her Vyvanse  to 60 mg daily.  She did try this for a few days and noted significant side effects and intolerance to the higher dose.  She has not been able to contact her psychiatrist yet and is currently in classes so feels that she needs a different dose as soon as possible.  Did discuss with her that she will need to contact her psychiatrist to review options for treatment since this is now under their [facility].  For now, we will discontinue Vyvanse  60 mg daily.  Switching to Vyvanse  30 mg daily.  Advised her that she will need to take her 60 mg dose to the pharmacy for  destruction prior to being able to pick up the 30 mg dose.  Patient reports that she will call her psychiatrist office and let them know about changing immediately. - lisdexamfetamine (VYVANSE ) 30 MG capsule; Take 1 capsule (30 mg total) by mouth daily.  Dispense: 30 capsule; Refill: 0  4. Encounter for screening for autism Has several concerns regarding possible autism.  She has been working with her psychiatrist and therapist regarding this and feels that she meets the criteria for diagnosis.  She is filled out several questionnaires that seem to point towards a diagnosis of autism.  Unfortunately, she has not undergone testing to confirm that she is on the autism spectrum disorder.  She is interested in getting fully tested so placing a referral to psychology today for full neuropsychological testing including evaluation for autism. - Ambulatory referral to Psychology  5. Change of voice Previously had issues with change of voice and intermittent hoarseness.  An ambulatory referral was sent to ENT however she reports that they never contacted her.  On review of the referral, there were 3 calls made to her without return call so the referral was closed.  Per patient request, reentering referral today. - Ambulatory referral to ENT  6. Encounter for eye exam She is due for an annual eye exam and does not currently have an eye provider.  Previously saw Dr. Jeffie downstairs and is amenable to returning.  Placing an ambulatory referral to ophthalmology to get her reconnected for  an appointment. - Ambulatory referral to Ophthalmology   Procedures performed this visit: None.  Return in about 6 months (around 02/08/2024) for chronic disease follow up.  __________________________________ Tasha FREDRIK Palin, DNP, APRN, FNP-BC Primary Care and Sports Medicine [facility] [place]

## 2023-08-09 LAB — WET PREP FOR TRICH, YEAST, CLUE
Clue Cell Exam: NEGATIVE
Trichomonas Exam: NEGATIVE
Yeast Exam: NEGATIVE

## 2023-08-09 LAB — VITAMIN D 25 HYDROXY (VIT D DEFICIENCY, FRACTURES): Vit D, 25-Hydroxy: 29.3 ng/mL — ABNORMAL LOW (ref 30.0–100.0)

## 2023-08-11 ENCOUNTER — Ambulatory Visit: Payer: Self-pay | Admitting: Medical-Surgical

## 2023-08-14 DIAGNOSIS — F909 Attention-deficit hyperactivity disorder, unspecified type: Secondary | ICD-10-CM | POA: Diagnosis not present

## 2023-08-14 DIAGNOSIS — F4312 Post-traumatic stress disorder, chronic: Secondary | ICD-10-CM | POA: Diagnosis not present

## 2023-08-17 DIAGNOSIS — Z419 Encounter for procedure for purposes other than remedying health state, unspecified: Secondary | ICD-10-CM | POA: Diagnosis not present

## 2023-08-20 DIAGNOSIS — F909 Attention-deficit hyperactivity disorder, unspecified type: Secondary | ICD-10-CM | POA: Diagnosis not present

## 2023-08-20 DIAGNOSIS — F4312 Post-traumatic stress disorder, chronic: Secondary | ICD-10-CM | POA: Diagnosis not present

## 2023-08-29 DIAGNOSIS — F840 Autistic disorder: Secondary | ICD-10-CM | POA: Diagnosis not present

## 2023-09-02 DIAGNOSIS — F840 Autistic disorder: Secondary | ICD-10-CM | POA: Diagnosis not present

## 2023-09-04 ENCOUNTER — Ambulatory Visit (INDEPENDENT_AMBULATORY_CARE_PROVIDER_SITE_OTHER): Admitting: Medical-Surgical

## 2023-09-04 ENCOUNTER — Encounter: Payer: Self-pay | Admitting: Medical-Surgical

## 2023-09-04 VITALS — BP 108/70 | HR 80 | Resp 20 | Ht 63.0 in | Wt 225.1 lb

## 2023-09-04 DIAGNOSIS — Z79899 Other long term (current) drug therapy: Secondary | ICD-10-CM | POA: Diagnosis not present

## 2023-09-04 DIAGNOSIS — E6609 Other obesity due to excess calories: Secondary | ICD-10-CM

## 2023-09-04 DIAGNOSIS — Z6839 Body mass index (BMI) 39.0-39.9, adult: Secondary | ICD-10-CM

## 2023-09-04 DIAGNOSIS — E66812 Obesity, class 2: Secondary | ICD-10-CM

## 2023-09-04 DIAGNOSIS — F419 Anxiety disorder, unspecified: Secondary | ICD-10-CM | POA: Diagnosis not present

## 2023-09-04 DIAGNOSIS — F840 Autistic disorder: Secondary | ICD-10-CM | POA: Diagnosis not present

## 2023-09-04 NOTE — Patient Instructions (Addendum)
 Reliving Past Lives: The Evidence Under Hypnosis Paperback - Import, September 23, 1978 by Sherrilyn Caves (Author)   Serotonin Syndrome Serotonin is a chemical that helps to control several functions in the body. This chemical is also called a neurotransmitter. It controls: Brain and nerve cell function. Mood and emotions. Memory. Eating. Sleeping. Sexual activity. Stress response. Having too much serotonin in your body can cause serotonin syndrome. This condition can be harmful to your brain and nerve cells. This can be a life-threatening condition. What are the causes? This condition may be caused by taking medicines or drugs that increase the level of serotonin in your body, such as: Antidepressant medicines. Migraine medicines. Certain pain medicines. Certain drugs, including ecstasy, LSD, cocaine, and amphetamines. Over-the-counter cough or cold medicines that contain dextromethorphan. Certain herbal supplements, including St. John's wort, ginseng, and nutmeg. This condition usually occurs when you take these medicines or drugs together, but it can also happen with a high dose of a single medicine or drug. What increases the risk? You are more likely to develop this condition if: You just started taking a medicine or drug that increases the level of serotonin in the body. You recently increased the dose of a medicine or drug that increases the level of serotonin in the body. You take more than one medicine or drug that increases the level of serotonin in the body. What are the signs or symptoms? Symptoms of this condition usually start within several hours of taking a medicine or drug. Symptoms may be mild or severe. Mild symptoms include: Sweating. Restlessness or agitation. Muscle twitching or stiffness. Rapid heart rate. Nausea, vomiting, or diarrhea. Shivering or goose bumps. Confusion. Severe symptoms include: Irregular heartbeat. Seizures. Loss of consciousness. High  fever. How is this diagnosed? This condition may be diagnosed based on: Your medical history. A physical exam. Your prior use of drugs and medicines. Blood or urine tests. These may be used to rule out other causes of your symptoms. How is this treated? The treatment for this condition depends on the severity of your symptoms. For mild cases, stopping the medicine or drug that caused your condition is usually all that is needed. For moderate to severe cases, treatment in a hospital may be needed to prevent or treat life-threatening symptoms. Treatment may include: Medicines to control your symptoms. IV fluids. Actions to support your breathing. Treatments to control your body temperature. Follow these instructions at home: Medicines  Take over-the-counter and prescription medicines only as told by your health care provider. Check with your health care provider before you start taking any new prescriptions, over-the-counter medicines, herbs, or supplements. Do not combine any medicines that can cause this condition. Lifestyle  Maintain a healthy lifestyle. Eat a healthy diet that includes plenty of vegetables, fruits, whole grains, low-fat dairy products, and lean protein. Do not eat a lot of foods that are high in fat, added sugars, or salt. Get the right amount and quality of sleep. Most adults need 7-9 hours of sleep each night. Make time to exercise, even if it is only for short periods of time. Most adults should exercise for at least 150 minutes each week. Do not drink alcohol. Do not use illegal drugs. Do not take medicines for reasons other than they are prescribed. General instructions Do not use any products that contain nicotine  or tobacco. These products include cigarettes, chewing tobacco, and vaping devices, such as e-cigarettes. If you need help quitting, ask your health care provider. Contact a health care  provider if: Your symptoms do not improve or they get  worse. Get help right away if: You have worsening confusion, severe headache, chest pain, high fever, seizures, or loss of consciousness. You experience serious side effects of medicine, such as swelling of your face, lips, tongue, or throat. These symptoms may be an emergency. Get help right away. Call 911. Do not wait to see if the symptoms will go away. Do not drive yourself to the hospital. Also, get help right away if: You have serious thoughts about hurting yourself or others. Take one of these steps if you feel like you may hurt yourself or others, or have thoughts about taking your own life: Go to your nearest emergency room. Call 911. Call the National Suicide Prevention Lifeline at 343 472 0569 or 988. This is open 24 hours a day. Text the Crisis Text Line at (845)073-0440. Summary Serotonin is a chemical that helps to control several functions in the body. High levels of serotonin in the body can cause serotonin syndrome, which can be life-threatening. This condition may be caused by taking medicines or drugs that increase the level of serotonin in your body. Treatment depends on the severity of your symptoms. For mild cases, stopping the medicine or drug that caused your condition is usually all that is needed. Check with your health care provider before you start taking any new prescriptions, over-the-counter medicines, herbs, or supplements. This information is not intended to replace advice given to you by your health care provider. Make sure you discuss any questions you have with your health care provider. Document Revised: 04/13/2021 Document Reviewed: 04/13/2021 Elsevier Patient Education  2024 [employer].
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
## 2023-09-05 ENCOUNTER — Ambulatory Visit: Payer: Self-pay | Admitting: Medical-Surgical

## 2023-09-05 DIAGNOSIS — D509 Iron deficiency anemia, unspecified: Secondary | ICD-10-CM

## 2023-09-05 LAB — CBC WITH DIFFERENTIAL/PLATELET
Basophils Absolute: 0 x10E3/uL (ref 0.0–0.2)
Basos: 0 %
EOS (ABSOLUTE): 0.1 x10E3/uL (ref 0.0–0.4)
Eos: 1 %
Hematocrit: 35 % (ref 34.0–46.6)
Hemoglobin: 10.8 g/dL — ABNORMAL LOW (ref 11.1–15.9)
Immature Grans (Abs): 0 x10E3/uL (ref 0.0–0.1)
Immature Granulocytes: 0 %
Lymphocytes Absolute: 1.9 x10E3/uL (ref 0.7–3.1)
Lymphs: 27 %
MCH: 25.2 pg — ABNORMAL LOW (ref 26.6–33.0)
MCHC: 30.9 g/dL — ABNORMAL LOW (ref 31.5–35.7)
MCV: 82 fL (ref 79–97)
Monocytes Absolute: 0.5 x10E3/uL (ref 0.1–0.9)
Monocytes: 8 %
Neutrophils Absolute: 4.3 x10E3/uL (ref 1.4–7.0)
Neutrophils: 64 %
Platelets: 329 x10E3/uL (ref 150–450)
RBC: 4.29 x10E6/uL (ref 3.77–5.28)
RDW: 14.3 % (ref 11.7–15.4)
WBC: 6.8 x10E3/uL (ref 3.4–10.8)

## 2023-09-05 LAB — CMP14+EGFR
ALT: 9 IU/L (ref 0–32)
AST: 16 IU/L (ref 0–40)
Albumin: 4.1 g/dL (ref 3.9–4.9)
Alkaline Phosphatase: 88 IU/L (ref 44–121)
BUN/Creatinine Ratio: 14 (ref 9–23)
BUN: 13 mg/dL (ref 6–20)
Bilirubin Total: <0.2 mg/dL (ref 0.0–1.2)
CO2: 20 mmol/L (ref 20–29)
Calcium: 9.4 mg/dL (ref 8.7–10.2)
Chloride: 103 mmol/L (ref 96–106)
Creatinine, Ser: 0.92 mg/dL (ref 0.57–1.00)
Globulin, Total: 3.4 g/dL (ref 1.5–4.5)
Glucose: 78 mg/dL (ref 70–99)
Potassium: 4.4 mmol/L (ref 3.5–5.2)
Sodium: 139 mmol/L (ref 134–144)
Total Protein: 7.5 g/dL (ref 6.0–8.5)
eGFR: 84 mL/min/1.73 (ref >59)

## 2023-09-05 LAB — HEMOGLOBIN A1C
Est. average glucose Bld gHb Est-mCnc: 105 mg/dL
Hgb A1c MFr Bld: 5.3 % (ref 4.8–5.6)

## 2023-09-05 LAB — CK: Total CK: 167 U/L (ref 32–182)

## 2023-09-05 LAB — T4, FREE: Free T4: 0.97 ng/dL (ref 0.82–1.77)

## 2023-09-05 LAB — TSH: TSH: 1.08 u[IU]/mL (ref 0.450–4.500)

## 2023-09-06 LAB — IRON DEF ANEMIA CAS.+CBC/D/PLT

## 2023-09-09 DIAGNOSIS — R490 Dysphonia: Secondary | ICD-10-CM | POA: Diagnosis not present

## 2023-09-09 DIAGNOSIS — J385 Laryngeal spasm: Secondary | ICD-10-CM | POA: Diagnosis not present

## 2023-09-10 DIAGNOSIS — F909 Attention-deficit hyperactivity disorder, unspecified type: Secondary | ICD-10-CM | POA: Diagnosis not present

## 2023-09-10 DIAGNOSIS — F419 Anxiety disorder, unspecified: Secondary | ICD-10-CM | POA: Diagnosis not present

## 2023-09-10 DIAGNOSIS — F603 Borderline personality disorder: Secondary | ICD-10-CM | POA: Diagnosis not present

## 2023-09-16 ENCOUNTER — Other Ambulatory Visit: Payer: Self-pay | Admitting: Medical-Surgical

## 2023-09-17 DIAGNOSIS — Z419 Encounter for procedure for purposes other than remedying health state, unspecified: Secondary | ICD-10-CM | POA: Diagnosis not present

## 2023-09-17 DIAGNOSIS — F603 Borderline personality disorder: Secondary | ICD-10-CM | POA: Diagnosis not present

## 2023-09-17 DIAGNOSIS — F419 Anxiety disorder, unspecified: Secondary | ICD-10-CM | POA: Diagnosis not present

## 2023-09-17 DIAGNOSIS — F909 Attention-deficit hyperactivity disorder, unspecified type: Secondary | ICD-10-CM | POA: Diagnosis not present

## 2023-09-17 LAB — IRON DEF ANEMIA CAS.+CBC/D/PLT
Basophils Absolute: 0 x10E3/uL (ref 0.0–0.2)
Basos: 0 %
EOS (ABSOLUTE): 0.1 x10E3/uL (ref 0.0–0.4)
Eos: 1 %
Hematocrit: 35 % (ref 34.0–46.6)
Hemoglobin: 10.8 g/dL — ABNORMAL LOW (ref 11.1–15.9)
Immature Grans (Abs): 0 x10E3/uL (ref 0.0–0.1)
Immature Granulocytes: 0 %
Lymphocytes Absolute: 1.9 x10E3/uL (ref 0.7–3.1)
Lymphs: 27 %
MCH: 25.2 pg — ABNORMAL LOW (ref 26.6–33.0)
MCHC: 30.9 g/dL — ABNORMAL LOW (ref 31.5–35.7)
MCV: 82 fL (ref 79–97)
Monocytes Absolute: 0.5 x10E3/uL (ref 0.1–0.9)
Monocytes: 8 %
Neutrophils Absolute: 4.3 x10E3/uL (ref 1.4–7.0)
Neutrophils: 64 %
Platelets: 329 x10E3/uL (ref 150–450)
RBC: 4.29 x10E6/uL (ref 3.77–5.28)
RDW: 14.3 % (ref 11.7–15.4)
WBC: 6.8 x10E3/uL (ref 3.4–10.8)

## 2023-09-17 LAB — SPECIMEN STATUS REPORT

## 2023-09-17 LAB — .IRON AND TIBC 004815

## 2023-09-17 LAB — .FERRITIN 004801

## 2023-09-19 DIAGNOSIS — F419 Anxiety disorder, unspecified: Secondary | ICD-10-CM | POA: Diagnosis not present

## 2023-09-19 DIAGNOSIS — F909 Attention-deficit hyperactivity disorder, unspecified type: Secondary | ICD-10-CM | POA: Diagnosis not present

## 2023-09-19 DIAGNOSIS — F603 Borderline personality disorder: Secondary | ICD-10-CM | POA: Diagnosis not present

## 2023-09-25 DIAGNOSIS — F603 Borderline personality disorder: Secondary | ICD-10-CM | POA: Diagnosis not present

## 2023-09-25 DIAGNOSIS — F419 Anxiety disorder, unspecified: Secondary | ICD-10-CM | POA: Diagnosis not present

## 2023-09-25 DIAGNOSIS — F909 Attention-deficit hyperactivity disorder, unspecified type: Secondary | ICD-10-CM | POA: Diagnosis not present

## 2023-09-30 DIAGNOSIS — F603 Borderline personality disorder: Secondary | ICD-10-CM | POA: Diagnosis not present

## 2023-09-30 DIAGNOSIS — F909 Attention-deficit hyperactivity disorder, unspecified type: Secondary | ICD-10-CM | POA: Diagnosis not present

## 2023-09-30 DIAGNOSIS — F419 Anxiety disorder, unspecified: Secondary | ICD-10-CM | POA: Diagnosis not present

## 2023-10-08 ENCOUNTER — Encounter: Payer: Self-pay | Admitting: Sports Medicine

## 2023-10-09 DIAGNOSIS — R490 Dysphonia: Secondary | ICD-10-CM | POA: Diagnosis not present

## 2023-10-09 DIAGNOSIS — F909 Attention-deficit hyperactivity disorder, unspecified type: Secondary | ICD-10-CM | POA: Diagnosis not present

## 2023-10-09 DIAGNOSIS — F419 Anxiety disorder, unspecified: Secondary | ICD-10-CM | POA: Diagnosis not present

## 2023-10-09 DIAGNOSIS — F603 Borderline personality disorder: Secondary | ICD-10-CM | POA: Diagnosis not present

## 2023-10-09 DIAGNOSIS — J385 Laryngeal spasm: Secondary | ICD-10-CM | POA: Diagnosis not present

## 2023-10-09 DIAGNOSIS — R499 Unspecified voice and resonance disorder: Secondary | ICD-10-CM | POA: Diagnosis not present

## 2023-10-14 DIAGNOSIS — F419 Anxiety disorder, unspecified: Secondary | ICD-10-CM | POA: Diagnosis not present

## 2023-10-14 DIAGNOSIS — F603 Borderline personality disorder: Secondary | ICD-10-CM | POA: Diagnosis not present

## 2023-10-14 DIAGNOSIS — F909 Attention-deficit hyperactivity disorder, unspecified type: Secondary | ICD-10-CM | POA: Diagnosis not present

## 2023-10-17 DIAGNOSIS — F419 Anxiety disorder, unspecified: Secondary | ICD-10-CM | POA: Diagnosis not present

## 2023-10-17 DIAGNOSIS — F909 Attention-deficit hyperactivity disorder, unspecified type: Secondary | ICD-10-CM | POA: Diagnosis not present

## 2023-10-17 DIAGNOSIS — F603 Borderline personality disorder: Secondary | ICD-10-CM | POA: Diagnosis not present

## 2023-10-18 DIAGNOSIS — Z419 Encounter for procedure for purposes other than remedying health state, unspecified: Secondary | ICD-10-CM | POA: Diagnosis not present

## 2023-10-21 DIAGNOSIS — F909 Attention-deficit hyperactivity disorder, unspecified type: Secondary | ICD-10-CM | POA: Diagnosis not present

## 2023-10-21 DIAGNOSIS — F419 Anxiety disorder, unspecified: Secondary | ICD-10-CM | POA: Diagnosis not present

## 2023-10-21 DIAGNOSIS — F603 Borderline personality disorder: Secondary | ICD-10-CM | POA: Diagnosis not present

## 2023-10-22 ENCOUNTER — Institutional Professional Consult (permissible substitution) (INDEPENDENT_AMBULATORY_CARE_PROVIDER_SITE_OTHER): Admitting: Otolaryngology

## 2023-10-24 DIAGNOSIS — F603 Borderline personality disorder: Secondary | ICD-10-CM | POA: Diagnosis not present

## 2023-10-24 DIAGNOSIS — F909 Attention-deficit hyperactivity disorder, unspecified type: Secondary | ICD-10-CM | POA: Diagnosis not present

## 2023-10-24 DIAGNOSIS — F419 Anxiety disorder, unspecified: Secondary | ICD-10-CM | POA: Diagnosis not present

## 2023-10-30 DIAGNOSIS — F848 Other pervasive developmental disorders: Secondary | ICD-10-CM | POA: Diagnosis not present

## 2023-10-31 DIAGNOSIS — F603 Borderline personality disorder: Secondary | ICD-10-CM | POA: Diagnosis not present

## 2023-10-31 DIAGNOSIS — F419 Anxiety disorder, unspecified: Secondary | ICD-10-CM | POA: Diagnosis not present

## 2023-10-31 DIAGNOSIS — F909 Attention-deficit hyperactivity disorder, unspecified type: Secondary | ICD-10-CM | POA: Diagnosis not present

## 2023-11-07 DIAGNOSIS — F419 Anxiety disorder, unspecified: Secondary | ICD-10-CM | POA: Diagnosis not present

## 2023-11-07 DIAGNOSIS — F603 Borderline personality disorder: Secondary | ICD-10-CM | POA: Diagnosis not present

## 2023-11-07 DIAGNOSIS — F909 Attention-deficit hyperactivity disorder, unspecified type: Secondary | ICD-10-CM | POA: Diagnosis not present

## 2023-11-08 ENCOUNTER — Emergency Department (HOSPITAL_BASED_OUTPATIENT_CLINIC_OR_DEPARTMENT_OTHER)

## 2023-11-08 ENCOUNTER — Ambulatory Visit

## 2023-11-08 ENCOUNTER — Other Ambulatory Visit: Payer: Self-pay

## 2023-11-08 ENCOUNTER — Encounter (HOSPITAL_BASED_OUTPATIENT_CLINIC_OR_DEPARTMENT_OTHER): Payer: Self-pay | Admitting: Emergency Medicine

## 2023-11-08 ENCOUNTER — Ambulatory Visit
Admission: RE | Admit: 2023-11-08 | Discharge: 2023-11-08 | Disposition: A | Discharge status: Home / Self Care | Attending: Family Medicine | Admitting: Family Medicine

## 2023-11-08 ENCOUNTER — Emergency Department (HOSPITAL_BASED_OUTPATIENT_CLINIC_OR_DEPARTMENT_OTHER)
Admission: EM | Admit: 2023-11-08 | Discharge: 2023-11-08 | Disposition: A | Discharge status: Home / Self Care | Source: Ambulatory Visit | Attending: Emergency Medicine | Admitting: Emergency Medicine

## 2023-11-08 ENCOUNTER — Ambulatory Visit (INDEPENDENT_AMBULATORY_CARE_PROVIDER_SITE_OTHER)

## 2023-11-08 VITALS — BP 139/90 | HR 87 | Temp 100.3°F | Resp 19

## 2023-11-08 DIAGNOSIS — D649 Anemia, unspecified: Secondary | ICD-10-CM | POA: Insufficient documentation

## 2023-11-08 DIAGNOSIS — Z8616 Personal history of COVID-19: Secondary | ICD-10-CM | POA: Diagnosis not present

## 2023-11-08 DIAGNOSIS — Z91040 Latex allergy status: Secondary | ICD-10-CM | POA: Diagnosis not present

## 2023-11-08 DIAGNOSIS — M48061 Spinal stenosis, lumbar region without neurogenic claudication: Secondary | ICD-10-CM | POA: Diagnosis not present

## 2023-11-08 DIAGNOSIS — M47816 Spondylosis without myelopathy or radiculopathy, lumbar region: Secondary | ICD-10-CM | POA: Diagnosis not present

## 2023-11-08 DIAGNOSIS — Z79899 Other long term (current) drug therapy: Secondary | ICD-10-CM | POA: Insufficient documentation

## 2023-11-08 DIAGNOSIS — M5136 Other intervertebral disc degeneration, lumbar region with discogenic back pain only: Secondary | ICD-10-CM | POA: Diagnosis not present

## 2023-11-08 DIAGNOSIS — M545 Low back pain, unspecified: Secondary | ICD-10-CM

## 2023-11-08 DIAGNOSIS — M5432 Sciatica, left side: Secondary | ICD-10-CM | POA: Diagnosis not present

## 2023-11-08 DIAGNOSIS — R109 Unspecified abdominal pain: Secondary | ICD-10-CM | POA: Diagnosis not present

## 2023-11-08 DIAGNOSIS — Z3202 Encounter for pregnancy test, result negative: Secondary | ICD-10-CM | POA: Diagnosis not present

## 2023-11-08 DIAGNOSIS — M51369 Other intervertebral disc degeneration, lumbar region without mention of lumbar back pain or lower extremity pain: Secondary | ICD-10-CM | POA: Insufficient documentation

## 2023-11-08 DIAGNOSIS — M5126 Other intervertebral disc displacement, lumbar region: Secondary | ICD-10-CM | POA: Diagnosis not present

## 2023-11-08 DIAGNOSIS — R509 Fever, unspecified: Secondary | ICD-10-CM

## 2023-11-08 LAB — POCT URINALYSIS DIP (MANUAL ENTRY)
Bilirubin, UA: NEGATIVE
Blood, UA: NEGATIVE
Glucose, UA: NEGATIVE mg/dL
Ketones, POC UA: NEGATIVE mg/dL
Leukocytes, UA: NEGATIVE
Nitrite, UA: NEGATIVE
Protein Ur, POC: NEGATIVE mg/dL
Spec Grav, UA: 1.025 (ref 1.010–1.025)
Urobilinogen, UA: 0.2 U/dL
pH, UA: 5.5 (ref 5.0–8.0)

## 2023-11-08 LAB — CBC WITH DIFFERENTIAL/PLATELET
Abs Immature Granulocytes: 0.03 K/uL (ref 0.00–0.07)
Basophils Absolute: 0 K/uL (ref 0.0–0.1)
Basophils Relative: 0 %
Eosinophils Absolute: 0.1 K/uL (ref 0.0–0.5)
Eosinophils Relative: 1 %
HCT: 35.7 % — ABNORMAL LOW (ref 36.0–46.0)
Hemoglobin: 10.9 g/dL — ABNORMAL LOW (ref 12.0–15.0)
Immature Granulocytes: 0 %
Lymphocytes Relative: 27 %
Lymphs Abs: 2.7 K/uL (ref 0.7–4.0)
MCH: 24.4 pg — ABNORMAL LOW (ref 26.0–34.0)
MCHC: 30.5 g/dL (ref 30.0–36.0)
MCV: 80 fL (ref 80.0–100.0)
Monocytes Absolute: 0.8 K/uL (ref 0.1–1.0)
Monocytes Relative: 8 %
Neutro Abs: 6.3 K/uL (ref 1.7–7.7)
Neutrophils Relative %: 64 %
Platelets: 310 K/uL (ref 150–400)
RBC: 4.46 MIL/uL (ref 3.87–5.11)
RDW: 14.8 % (ref 11.5–15.5)
WBC: 10 K/uL (ref 4.0–10.5)
nRBC: 0 % (ref 0.0–0.2)

## 2023-11-08 LAB — CBG MONITORING, ED: Glucose-Capillary: 134 mg/dL — ABNORMAL HIGH (ref 70–99)

## 2023-11-08 LAB — RESP PANEL BY RT-PCR (RSV, FLU A&B, COVID)  RVPGX2
Influenza A by PCR: NEGATIVE
Influenza B by PCR: NEGATIVE
Resp Syncytial Virus by PCR: NEGATIVE
SARS Coronavirus 2 by RT PCR: NEGATIVE

## 2023-11-08 LAB — COMPREHENSIVE METABOLIC PANEL WITH GFR
ALT: 9 U/L (ref 0–44)
AST: 16 U/L (ref 15–41)
Albumin: 4.2 g/dL (ref 3.5–5.0)
Alkaline Phosphatase: 84 U/L (ref 38–126)
Anion gap: 10 (ref 5–15)
BUN: 9 mg/dL (ref 6–20)
CO2: 25 mmol/L (ref 22–32)
Calcium: 9.5 mg/dL (ref 8.9–10.3)
Chloride: 105 mmol/L (ref 98–111)
Creatinine, Ser: 0.86 mg/dL (ref 0.44–1.00)
GFR, Estimated: >60 mL/min (ref >60)
Glucose, Bld: 68 mg/dL — ABNORMAL LOW (ref 70–99)
Potassium: 3.7 mmol/L (ref 3.5–5.1)
Sodium: 140 mmol/L (ref 135–145)
Total Bilirubin: 0.2 mg/dL (ref 0.0–1.2)
Total Protein: 8.1 g/dL (ref 6.5–8.1)

## 2023-11-08 LAB — POCT URINE PREGNANCY: Preg Test, Ur: NEGATIVE

## 2023-11-08 LAB — LIPASE, BLOOD: Lipase: 24 U/L (ref 11–51)

## 2023-11-08 MED ORDER — CELECOXIB 200 MG PO CAPS: 200.0000 mg | ORAL_CAPSULE | Freq: Two times a day (BID) | ORAL | 0 refills | Status: DC

## 2023-11-08 MED ORDER — KETOROLAC TROMETHAMINE 30 MG/ML IJ SOLN
30.0000 mg | Freq: Once | INTRAMUSCULAR | Status: AC
  Administered 2023-11-08: 30 mg via INTRAVENOUS
  Filled 2023-11-08: qty 1

## 2023-11-08 MED ORDER — DEXAMETHASONE SODIUM PHOSPHATE 10 MG/ML IJ SOLN
10.0000 mg | Freq: Once | INTRAMUSCULAR | Status: AC
  Administered 2023-11-08: 10 mg via INTRAVENOUS
  Filled 2023-11-08: qty 1

## 2023-11-08 MED ORDER — METHOCARBAMOL 500 MG PO TABS: 500.0000 mg | ORAL_TABLET | Freq: Three times a day (TID) | ORAL | 0 refills | Status: DC | PRN

## 2023-11-08 MED ORDER — METHYLPREDNISOLONE 4 MG PO TBPK: ORAL_TABLET | ORAL | 0 refills | Status: DC

## 2023-11-08 NOTE — ED Triage Notes (Signed)
 Pt c/o L lower back pain x 5 days, sent from UC.  UA and imaging neg at [facility].  Shooting pain with ambulation. Denies known injury.
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
## 2023-11-08 NOTE — Discharge Instructions (Addendum)
 Advised patient of lumbar spine x-ray results with hardcopy provided to patient.  Advised patient may take OTC Tylenol  1000 mg every 6 hours for fever (oral temperature greater than 100.3).  Encouraged to increase daily water intake to 64 ounces per day while taking this medication.  Advised if symptoms worsen and/or unresolved please follow-up with your PCP or go to [facility] ED for further evaluation.

## 2023-11-08 NOTE — ED Triage Notes (Signed)
 Pt presents to us  with back pain since monday  Pt reports she has right sided back pain shooting up her back and radiating around her left abdomin. Pt has not taken any otc medications.

## 2023-11-08 NOTE — Discharge Instructions (Addendum)
###   Lumbar CT and Treatment Summary  1) call PCP- for referral to PT for eval and treat bulging disc     Your recent CT scan of the lower back (lumbar spine) showed mostly mild changes. There is **no major disc problem or narrowing of the spaces for nerves** at most levels. At L3-L4, there is a mild bulge of a disc and mild narrowing of the central canal, which is the space where nerves travel. At L4-L5, there is a small disc herniation on the left side, which may be pressing slightly on a nerve that goes down your leg. These findings can sometimes cause back pain or leg symptoms, but many people improve over time without surgery.[1][2]      **What do these findings mean?**      - Most people with mild disc bulges or small herniations get better with time and simple treatments.      - The changes seen on your scan are common and often improve with rest, activity changes, and medications.[1][2]      - Surgery is rarely needed unless symptoms are severe or do not improve after several weeks.      **Planned Treatment:**      - **Medrol  Dosepak (oral steroids):** This is a short course of medication to help reduce inflammation and swelling around the nerves. Studies show it may help with function, but it does not always reduce pain and can have side effects like trouble sleeping or increased appetite.[3]      - **NSAIDs (like ibuprofen ):** These help decrease pain and inflammation. They can offer some relief, especially for back pain, but their benefit for leg pain (sciatica) is less clear.[1]      - **Robaxin (methocarbamol):** This is a muscle relaxant that may help with muscle spasms. There is limited evidence for its benefit, but it is sometimes used for short-term relief.[1]      **What else can help?**      - Staying active and doing gentle exercises can help you recover faster. Physical therapy is often recommended.[1][2]      - Most people feel better within 6 weeks. If your symptoms do  not improve, further treatments or tests may be considered.[1][2]      **When to seek help:**      - If you develop new weakness, trouble controlling your bladder or bowels, or severe numbness in the groin area, contact your doctor right away.      If you have questions about your medications or your scan results, please ask your healthcare team.      ### References  1. Herniated Lumbar Intervertebral Disk. Deyo RA, Jackee FREED. The [place] Journal of Medicine. 2016;374(18):1763-72. doi:10.1056/NEJMcp1512658. 2. Lumbar Disc Herniation: Diagnosis and Management. Zhang AS, Xu A, Ansari K, et al. The American Journal of Medicine. 2023;136(7):645-651. doi:10.1016/j.amjmed.2023.03.024. 3. Oral Steroids for Acute Radiculopathy Due to a Herniated Lumbar Disk: A Randomized Clinical Trial. Perry DEL, Matias LELON Rawleigh CHRISTELLA, et al. JAMA. 2015;313(19):1915-23. doi:10.1001/jama.[email or web address].
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
## 2023-11-08 NOTE — ED Notes (Signed)
 Pt provided with peanut butter crackers and apple juice for low blood sugar

## 2023-11-13 DIAGNOSIS — F840 Autistic disorder: Secondary | ICD-10-CM | POA: Diagnosis not present

## 2023-11-17 DIAGNOSIS — Z419 Encounter for procedure for purposes other than remedying health state, unspecified: Secondary | ICD-10-CM | POA: Diagnosis not present

## 2023-11-18 DIAGNOSIS — F419 Anxiety disorder, unspecified: Secondary | ICD-10-CM | POA: Diagnosis not present

## 2023-11-18 DIAGNOSIS — F909 Attention-deficit hyperactivity disorder, unspecified type: Secondary | ICD-10-CM | POA: Diagnosis not present

## 2023-11-18 DIAGNOSIS — F603 Borderline personality disorder: Secondary | ICD-10-CM | POA: Diagnosis not present

## 2023-11-19 ENCOUNTER — Encounter: Payer: Self-pay | Admitting: Medical-Surgical

## 2023-11-19 ENCOUNTER — Ambulatory Visit (INDEPENDENT_AMBULATORY_CARE_PROVIDER_SITE_OTHER): Admitting: Medical-Surgical

## 2023-11-19 VITALS — BP 120/70 | HR 85 | Resp 20 | Ht 63.0 in | Wt 232.1 lb

## 2023-11-19 DIAGNOSIS — F902 Attention-deficit hyperactivity disorder, combined type: Secondary | ICD-10-CM | POA: Diagnosis not present

## 2023-11-19 DIAGNOSIS — M51369 Other intervertebral disc degeneration, lumbar region without mention of lumbar back pain or lower extremity pain: Secondary | ICD-10-CM

## 2023-11-19 DIAGNOSIS — F332 Major depressive disorder, recurrent severe without psychotic features: Secondary | ICD-10-CM | POA: Diagnosis not present

## 2023-11-19 MED ORDER — LISDEXAMFETAMINE DIMESYLATE 30 MG PO CAPS: 30.0000 mg | ORAL_CAPSULE | Freq: Every day | ORAL | 0 refills | Status: DC

## 2023-11-19 NOTE — Progress Notes (Signed)
        Established patient visit   History of Present Illness   Discussed the use of AI scribe software for clinical note transcription with the patient, who gave verbal consent to proceed.  History of Present Illness   Tasha Barrett is a 33 year old female who presents for follow-up after an ER visit for a bulging disc.  Lumbar radiculopathy and back pain - Recent emergency room visit for acute back pain diagnosed as bulging disc - Imaging studies performed during ER visit - Completed a course of methylprednisolone  - Currently using Celebrex, but unsure of dosing schedule - Back pain currently resolved - Experiencing menstrual cramping not related to back pain  Iron deficiency anemia - Recent hemoglobin level of 10.9 g/dL - Managing anemia with a green smoothie regimen - Plans to incorporate a comprehensive supplement - Considering iron supplementation  Psychiatric medication management and mental health - Currently taking Vyvanse , supply running low - Contemplating whether to continue with current psychiatrist or transition medication management to primary care - Frustration with ongoing mental health assessments and therapy, especially following a recent breakup     Physical Exam   Physical Exam Vitals reviewed.  Constitutional:      General: She is not in acute distress.    Appearance: Normal appearance.  HENT:     Head: Normocephalic and atraumatic.  Cardiovascular:     Rate and Rhythm: Normal rate and regular rhythm.  Pulmonary:     Effort: Pulmonary effort is normal. No respiratory distress.  Skin:    General: Skin is warm and dry.  Neurological:     Mental Status: She is alert and oriented to person, place, and time.  Psychiatric:        Mood and Affect: Mood normal.        Behavior: Behavior normal.        Thought Content: Thought content normal.        Judgment: Judgment normal.    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 Current back pain is not severe. - Refer to in-house physical therapy for lumbar spine strengthening and stretching exercises. - Continue Celebrex twice daily as needed for pain management.  Iron deficiency anemia Chronic mild iron deficiency anemia with hemoglobin level of 10.9 g/dL.  History of iron deficiency. - Recommend daily iron supplementation. Adjust to every other day dosing if gastrointestinal side effects occur.  Attention-deficit hyperactivity disorder, combined type ADHD, combined type, managed with Vyvanse . She is dissatisfied with current psychiatrist and considering transferring medication management to primary care. Vyvanse  prescription is running low with only four doses remaining. - Vyvanse  refill sent as requested but recommend she update her psychiatrist. - Strongly recommend continuing counseling and psychiatric management of medications.    Follow up   Return in about 6 weeks (around 12/31/2023) for low back pain follow up if not better. __________________________________ Zada FREDRIK Palin, DNP, APRN, FNP-BC Primary Care and Sports Medicine [facility] [place]

## 2023-11-28 DIAGNOSIS — F603 Borderline personality disorder: Secondary | ICD-10-CM | POA: Diagnosis not present

## 2023-11-28 DIAGNOSIS — F909 Attention-deficit hyperactivity disorder, unspecified type: Secondary | ICD-10-CM | POA: Diagnosis not present

## 2023-11-29 DIAGNOSIS — F332 Major depressive disorder, recurrent severe without psychotic features: Secondary | ICD-10-CM | POA: Diagnosis not present

## 2023-12-02 DIAGNOSIS — F909 Attention-deficit hyperactivity disorder, unspecified type: Secondary | ICD-10-CM | POA: Diagnosis not present

## 2023-12-02 DIAGNOSIS — F419 Anxiety disorder, unspecified: Secondary | ICD-10-CM | POA: Diagnosis not present

## 2023-12-02 DIAGNOSIS — F603 Borderline personality disorder: Secondary | ICD-10-CM | POA: Diagnosis not present

## 2023-12-03 DIAGNOSIS — F603 Borderline personality disorder: Secondary | ICD-10-CM | POA: Diagnosis not present

## 2023-12-03 DIAGNOSIS — F419 Anxiety disorder, unspecified: Secondary | ICD-10-CM | POA: Diagnosis not present

## 2023-12-03 DIAGNOSIS — F909 Attention-deficit hyperactivity disorder, unspecified type: Secondary | ICD-10-CM | POA: Diagnosis not present
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
## 2023-12-11 ENCOUNTER — Encounter: Payer: Self-pay | Admitting: Rehabilitative and Restorative Service Providers"

## 2023-12-11 ENCOUNTER — Other Ambulatory Visit: Payer: Self-pay

## 2023-12-11 ENCOUNTER — Ambulatory Visit: Attending: Medical-Surgical | Admitting: Rehabilitative and Restorative Service Providers"

## 2023-12-11 DIAGNOSIS — M5459 Other low back pain: Secondary | ICD-10-CM | POA: Diagnosis not present

## 2023-12-11 DIAGNOSIS — M6281 Muscle weakness (generalized): Secondary | ICD-10-CM | POA: Insufficient documentation

## 2023-12-11 DIAGNOSIS — R29898 Other symptoms and signs involving the musculoskeletal system: Secondary | ICD-10-CM | POA: Insufficient documentation

## 2023-12-11 DIAGNOSIS — M51369 Other intervertebral disc degeneration, lumbar region without mention of lumbar back pain or lower extremity pain: Secondary | ICD-10-CM | POA: Insufficient documentation

## 2023-12-12 DIAGNOSIS — F603 Borderline personality disorder: Secondary | ICD-10-CM | POA: Diagnosis not present

## 2023-12-12 DIAGNOSIS — F419 Anxiety disorder, unspecified: Secondary | ICD-10-CM | POA: Diagnosis not present

## 2023-12-12 DIAGNOSIS — F909 Attention-deficit hyperactivity disorder, unspecified type: Secondary | ICD-10-CM | POA: Diagnosis not present

## 2023-12-18 DIAGNOSIS — Z419 Encounter for procedure for purposes other than remedying health state, unspecified: Secondary | ICD-10-CM | POA: Diagnosis not present

## 2023-12-22 ENCOUNTER — Other Ambulatory Visit: Payer: Self-pay

## 2023-12-22 ENCOUNTER — Encounter (HOSPITAL_BASED_OUTPATIENT_CLINIC_OR_DEPARTMENT_OTHER): Payer: Self-pay | Admitting: Emergency Medicine

## 2023-12-22 ENCOUNTER — Emergency Department (HOSPITAL_BASED_OUTPATIENT_CLINIC_OR_DEPARTMENT_OTHER)
Admission: EM | Admit: 2023-12-22 | Discharge: 2023-12-22 | Disposition: A | Discharge status: Home / Self Care | Attending: Emergency Medicine | Admitting: Emergency Medicine

## 2023-12-22 ENCOUNTER — Emergency Department (HOSPITAL_BASED_OUTPATIENT_CLINIC_OR_DEPARTMENT_OTHER)

## 2023-12-22 DIAGNOSIS — Z91040 Latex allergy status: Secondary | ICD-10-CM | POA: Diagnosis not present

## 2023-12-22 DIAGNOSIS — Y9351 Activity, roller skating (inline) and skateboarding: Secondary | ICD-10-CM | POA: Insufficient documentation

## 2023-12-22 DIAGNOSIS — S63602A Unspecified sprain of left thumb, initial encounter: Secondary | ICD-10-CM | POA: Diagnosis not present

## 2023-12-22 DIAGNOSIS — M79645 Pain in left finger(s): Secondary | ICD-10-CM | POA: Diagnosis not present

## 2023-12-22 MED ORDER — NAPROXEN 250 MG PO TABS
500.0000 mg | ORAL_TABLET | Freq: Once | ORAL | Status: AC
  Administered 2023-12-22: 500 mg via ORAL
  Filled 2023-12-22: qty 2

## 2023-12-22 NOTE — ED Provider Notes (Signed)
 [place] [facility] AT [facility]  Provider Note  CSN: [id number] Arrival date & time: 12/22/23 0255  History Chief Complaint  Patient presents with   Thumb Injury    Tasha Barrett is a 33 y.o. female reports she initially injured her L thumb while opening a box on Thursday night, then fell on Friday while rollerskating onto outstretched hand. She has had continued pain in the base of the L thumb, worse with movement.    Home Medications Prior to Admission medications   Medication Sig Start Date End Date Taking? Authorizing Provider  AMBULATORY NON FORMULARY MEDICATION Medication Name: Therapeutic massage once monthly for polyarthralgia and myalgias. 04/18/23   Willo Mini, NP  AMBULATORY NON FORMULARY MEDICATION Medication Name: Therapeutic massage once monthly for polyarthralgia/myalgia. 04/18/23   Willo Mini, NP  celecoxib (CELEBREX) 200 MG capsule Take 1 capsule (200 mg total) by mouth 2 (two) times daily. 11/08/23   Harris, Abigail, PA-C  lisdexamfetamine (VYVANSE ) 30 MG capsule Take 1 capsule (30 mg total) by mouth daily. 11/19/23   Willo Mini, NP  methocarbamol (ROBAXIN) 500 MG tablet Take 1 tablet (500 mg total) by mouth 3 (three) times daily as needed for muscle spasms. 11/08/23   Harris, Abigail, PA-C  venlafaxine  XR (EFFEXOR -XR) 75 MG 24 hr capsule Take 1 capsule (75 mg total) by mouth at bedtime. 10/02/22   Willo Mini, NP     Allergies    Amoxicillin-pot clavulanate, Metronidazole , and Latex   Review of Systems   Review of Systems Please see HPI for pertinent positives and negatives  Physical Exam BP (!) 145/73 (BP Location: Right Arm)   Pulse 83   Temp 98.4 F (36.9 C) (Oral)   Resp 20   Ht 5' 3 (1.6 m)   Wt 104.8 kg   SpO2 100%   BMI 40.92 kg/m   Physical Exam Vitals and nursing note reviewed.  HENT:     Head: Normocephalic.     Nose: Nose normal.  Eyes:     Extraocular Movements: Extraocular movements intact.   Cardiovascular:     Pulses: Normal pulses.  Pulmonary:     Effort: Pulmonary effort is normal.  Musculoskeletal:        General: Tenderness (base of L thumb, no snuffbox tenderness) present. No deformity. Normal range of motion.     Cervical back: Neck supple.  Skin:    Findings: No rash (on exposed skin).  Neurological:     Mental Status: She is alert and oriented to person, place, and time.  Psychiatric:        Mood and Affect: Mood normal.     ED Results / Procedures / Treatments   EKG None  Procedures Procedures  Medications Ordered in the ED Medications  naproxen (NAPROSYN) tablet 500 mg (500 mg Oral Given 12/22/23 0335)    Initial Impression and Plan  Patient here with L wrist/thumb injury. Will send for xrays. Naprosyn for pain. She already has a brace from outpatient pharmacy.   ED Course   Clinical Course as of 12/22/23 9566  [place] Dec 22, 2023  9567 I personally viewed the images from radiology studies and agree with radiologist interpretation: Xrays are neg. Patient requesting ACE wrap. Recommend rest, ice, elevation and PCP follow up, RTED for any other concerns.   [CS]    Clinical Course User Index [CS] Roselyn Carlin NOVAK, MD     MDM Rules/Calculators/A&P Medical Decision Making Problems Addressed: Sprain of left thumb, unspecified site of  digit, initial encounter: acute illness or injury  Amount and/or Complexity of Data Reviewed Radiology: ordered and independent interpretation performed. Decision-making details documented in ED Course.  Risk Prescription drug management.     Final Clinical Impression(s) / ED Diagnoses Final diagnoses:  Sprain of left thumb, unspecified site of digit, initial encounter    Rx / DC Orders ED Discharge Orders     None        Roselyn Carlin NOVAK, MD 12/22/23 [phone]

## 2023-12-22 NOTE — ED Triage Notes (Addendum)
 L thumb/hand injury on Thursday or Friday. She says she was not sure what she did, was opening something with her hands, felt a pop and some pain. Worsening pain after then falling onto the same hand yesterday. Pt presents with a brace on from home.

## 2023-12-24 ENCOUNTER — Ambulatory Visit

## 2023-12-26 DIAGNOSIS — F909 Attention-deficit hyperactivity disorder, unspecified type: Secondary | ICD-10-CM | POA: Diagnosis not present

## 2023-12-26 DIAGNOSIS — F603 Borderline personality disorder: Secondary | ICD-10-CM | POA: Diagnosis not present

## 2023-12-26 DIAGNOSIS — F419 Anxiety disorder, unspecified: Secondary | ICD-10-CM | POA: Diagnosis not present

## 2023-12-31 ENCOUNTER — Ambulatory Visit: Admitting: Medical-Surgical

## 2024-01-06 ENCOUNTER — Telehealth: Payer: Self-pay

## 2024-01-06 NOTE — Telephone Encounter (Signed)
 Pharmacy updated.

## 2024-01-06 NOTE — Telephone Encounter (Signed)
 Copied from CRM [id number]. Topic: Clinical - Prescription Issue >> Jan 01, 2024  4:01 PM Anairis L wrote: Reason for CRM: Pharmacy update info   Store ID: [id number]  [address] [place], [place] [address] Get directions [phone]

## 2024-01-07 DIAGNOSIS — F909 Attention-deficit hyperactivity disorder, unspecified type: Secondary | ICD-10-CM | POA: Diagnosis not present

## 2024-01-07 DIAGNOSIS — F603 Borderline personality disorder: Secondary | ICD-10-CM | POA: Diagnosis not present

## 2024-01-07 DIAGNOSIS — F419 Anxiety disorder, unspecified: Secondary | ICD-10-CM | POA: Diagnosis not present

## 2024-01-10 ENCOUNTER — Encounter: Payer: Self-pay | Admitting: Medical-Surgical

## 2024-01-10 ENCOUNTER — Ambulatory Visit: Admitting: Medical-Surgical

## 2024-01-10 VITALS — BP 119/75 | HR 82 | Ht 63.0 in | Wt 232.0 lb

## 2024-01-10 DIAGNOSIS — F332 Major depressive disorder, recurrent severe without psychotic features: Secondary | ICD-10-CM

## 2024-01-10 DIAGNOSIS — Z23 Encounter for immunization: Secondary | ICD-10-CM

## 2024-01-10 DIAGNOSIS — F902 Attention-deficit hyperactivity disorder, combined type: Secondary | ICD-10-CM

## 2024-01-10 DIAGNOSIS — D509 Iron deficiency anemia, unspecified: Secondary | ICD-10-CM | POA: Diagnosis not present

## 2024-01-10 MED ORDER — LISDEXAMFETAMINE DIMESYLATE 30 MG PO CAPS: 30.0000 mg | ORAL_CAPSULE | Freq: Every day | ORAL | 0 refills | Status: AC

## 2024-01-10 NOTE — Progress Notes (Signed)
 Established Patient Office Visit  Subjective   Patient ID: Tasha Barrett, female    DOB: 1990/04/27  Age: 33 y.o. MRN: [id number]  Chief Complaint  Patient presents with   Back Pain    Back Pain    33 year old female presents for follow up on back pain and medication refills  Patient states that her back pain has resolved. Discontinued celebrex  and robaxin . Patient will follow up if symptoms return.  ADHD Patient request refills on her Vyvanse  30 mg daily. She states that she started taking 60 mg every other day because she ran out of her 30 mg prescription. Would like for her prescription to be transferred back to here because she prefers not to go back to the [facility] that she was previously seeing. Requests refill on her medication.  MDD Patient is currently taking Effexor  75 mg daily. She reports doing well on her current dose. Does not need any refills at this time.  Review of Systems  Constitutional: Negative.   HENT: Negative.    Eyes: Negative.   Respiratory: Negative.    Cardiovascular: Negative.   Gastrointestinal: Negative.   Genitourinary: Negative.   Musculoskeletal:  Positive for back pain.  Skin: Negative.   Neurological: Negative.   Endo/Heme/Allergies: Negative.   Psychiatric/Behavioral: Negative.        Objective:     BP 119/75   Pulse 82   Ht 5' 3 (1.6 m)   Wt 105.2 kg   SpO2 98%   BMI 41.10 kg/m  BP Readings from Last 3 Encounters:  01/10/24 119/75  12/22/23 136/78  11/19/23 120/70      Physical Exam Vitals and nursing note reviewed.  Constitutional:      General: She is not in acute distress.    Appearance: Normal appearance.  Cardiovascular:     Rate and Rhythm: Normal rate and regular rhythm.     Pulses: Normal pulses.     Heart sounds: Normal heart sounds.  Pulmonary:     Effort: Pulmonary effort is normal.     Breath sounds: Normal breath sounds.  Neurological:     General: No focal deficit present.     Mental Status:  She is alert and oriented to person, place, and time.  Psychiatric:        Mood and Affect: Mood normal.        Behavior: Behavior normal.        Thought Content: Thought content normal.        Judgment: Judgment normal.      No results found for any visits on 01/10/24.  Last CBC Lab Results  Component Value Date   WBC 10.0 11/08/2023   HGB 10.9 (L) 11/08/2023   HCT 35.7 (L) 11/08/2023   MCV 80.0 11/08/2023   MCH 24.4 (L) 11/08/2023   RDW 14.8 11/08/2023   PLT 310 11/08/2023      The ASCVD Risk score (Arnett DK, et al., 2019) failed to calculate for the following reasons:   The 2019 ASCVD risk score is only valid for ages 21 to 66    Assessment & Plan:  1. Attention deficit hyperactivity disorder (ADHD), combined type -Continue with current regimen Adderall 30 mg daily -Refill sent to the pharmacy  2. Severe episode of recurrent major depressive disorder, without psychotic features (HCC) (Primary) -Continue with current Regimen Effexor  75 mg daily    Return in about 6 months (around 07/10/2024) for ADHD/ Mood Follow up.    Rushie Brazel JINNY Freund,  NP Student

## 2024-01-11 LAB — IRON,TIBC AND FERRITIN PANEL
Ferritin: 32 ng/mL (ref 15–150)
Iron Saturation: 6 % — CL (ref 15–55)
Iron: 18 ug/dL — ABNORMAL LOW (ref 27–159)
Total Iron Binding Capacity: 323 ug/dL (ref 250–450)
UIBC: 305 ug/dL (ref 131–425)

## 2024-01-12 NOTE — Progress Notes (Signed)
 Prior issue with back pain has fully resolved.   Medical screening examination/treatment was performed by qualified clinical staff member and as supervising provider I was immediately available for consultation/collaboration. I have reviewed documentation and agree with assessment and plan.  Zada FREDRIK Palin, DNP, APRN, FNP-BC [facility]

## 2024-01-20 NOTE — Addendum Note (Signed)
 Addended byBETHA WILLO MINI on: 01/20/2024 12:38 PM   Modules accepted: Orders

## 2024-02-19 ENCOUNTER — Other Ambulatory Visit (HOSPITAL_COMMUNITY): Payer: Self-pay

## 2024-02-24 ENCOUNTER — Other Ambulatory Visit (HOSPITAL_COMMUNITY): Payer: Self-pay
